# Patient Record
Sex: Female | Born: 1970 | Race: White | Hispanic: No | Marital: Single | Smoking: Current every day smoker
Health system: Southern US, Community
[De-identification: ages and names within clinical notes are randomized; demographics above are authoritative.]

## PROBLEM LIST (undated history)

## (undated) DIAGNOSIS — Z720 Tobacco use: Secondary | ICD-10-CM

## (undated) DIAGNOSIS — Z973 Presence of spectacles and contact lenses: Secondary | ICD-10-CM

## (undated) DIAGNOSIS — L732 Hidradenitis suppurativa: Secondary | ICD-10-CM

## (undated) DIAGNOSIS — Z8742 Personal history of other diseases of the female genital tract: Secondary | ICD-10-CM

## (undated) DIAGNOSIS — D071 Carcinoma in situ of vulva: Secondary | ICD-10-CM

## (undated) HISTORY — DX: Tobacco use: Z72.0

## (undated) HISTORY — PX: NO PAST SURGERIES: SHX2092

---

## 2001-11-12 ENCOUNTER — Other Ambulatory Visit: Admission: RE | Admit: 2001-11-12 | Discharge: 2001-11-12 | Payer: Self-pay

## 2002-06-24 ENCOUNTER — Other Ambulatory Visit: Admission: RE | Admit: 2002-06-24 | Discharge: 2002-06-24 | Payer: Self-pay

## 2004-06-22 ENCOUNTER — Ambulatory Visit (HOSPITAL_COMMUNITY): Admission: RE | Admit: 2004-06-22 | Discharge: 2004-06-22 | Payer: Self-pay | Admitting: Family Medicine

## 2005-08-16 ENCOUNTER — Ambulatory Visit: Payer: Self-pay | Admitting: Internal Medicine

## 2005-08-17 ENCOUNTER — Ambulatory Visit (HOSPITAL_COMMUNITY): Admission: RE | Admit: 2005-08-17 | Discharge: 2005-08-17 | Payer: Self-pay | Admitting: Internal Medicine

## 2005-08-22 ENCOUNTER — Ambulatory Visit: Payer: Self-pay | Admitting: Internal Medicine

## 2010-08-31 ENCOUNTER — Ambulatory Visit (HOSPITAL_COMMUNITY)
Admission: RE | Admit: 2010-08-31 | Discharge: 2010-08-31 | Payer: Self-pay | Source: Home / Self Care | Attending: Family Medicine | Admitting: Family Medicine

## 2011-06-05 ENCOUNTER — Other Ambulatory Visit (HOSPITAL_COMMUNITY): Payer: Self-pay | Admitting: Family Medicine

## 2011-06-05 DIAGNOSIS — N938 Other specified abnormal uterine and vaginal bleeding: Secondary | ICD-10-CM

## 2011-06-06 ENCOUNTER — Inpatient Hospital Stay (HOSPITAL_COMMUNITY): Admission: RE | Admit: 2011-06-06 | Payer: Self-pay | Source: Ambulatory Visit

## 2011-06-08 ENCOUNTER — Ambulatory Visit (HOSPITAL_COMMUNITY)
Admission: RE | Admit: 2011-06-08 | Discharge: 2011-06-08 | Disposition: A | Discharge status: Home / Self Care | Payer: Self-pay | Source: Ambulatory Visit | Attending: Family Medicine | Admitting: Family Medicine

## 2011-06-08 DIAGNOSIS — N938 Other specified abnormal uterine and vaginal bleeding: Secondary | ICD-10-CM | POA: Insufficient documentation

## 2011-06-08 DIAGNOSIS — N949 Unspecified condition associated with female genital organs and menstrual cycle: Secondary | ICD-10-CM | POA: Insufficient documentation

## 2011-10-20 ENCOUNTER — Other Ambulatory Visit (HOSPITAL_COMMUNITY): Payer: Self-pay

## 2011-10-20 DIAGNOSIS — Z1231 Encounter for screening mammogram for malignant neoplasm of breast: Secondary | ICD-10-CM

## 2011-10-25 ENCOUNTER — Other Ambulatory Visit (HOSPITAL_COMMUNITY): Payer: Self-pay | Admitting: Family Medicine

## 2011-10-25 DIAGNOSIS — Z1231 Encounter for screening mammogram for malignant neoplasm of breast: Secondary | ICD-10-CM

## 2011-10-26 ENCOUNTER — Ambulatory Visit (HOSPITAL_COMMUNITY)
Admission: RE | Admit: 2011-10-26 | Discharge: 2011-10-26 | Disposition: A | Discharge status: Home / Self Care | Payer: Self-pay | Source: Ambulatory Visit

## 2011-10-26 DIAGNOSIS — Z1231 Encounter for screening mammogram for malignant neoplasm of breast: Secondary | ICD-10-CM

## 2012-05-09 ENCOUNTER — Other Ambulatory Visit (HOSPITAL_COMMUNITY): Payer: Self-pay | Admitting: Nurse Practitioner

## 2012-05-15 ENCOUNTER — Other Ambulatory Visit (HOSPITAL_COMMUNITY): Payer: Self-pay | Admitting: Nurse Practitioner

## 2012-05-15 ENCOUNTER — Ambulatory Visit (HOSPITAL_COMMUNITY)
Admission: RE | Admit: 2012-05-15 | Discharge: 2012-05-15 | Disposition: A | Discharge status: Home / Self Care | Payer: PRIVATE HEALTH INSURANCE | Source: Ambulatory Visit | Attending: Nurse Practitioner | Admitting: Nurse Practitioner

## 2012-05-15 DIAGNOSIS — N6489 Other specified disorders of breast: Secondary | ICD-10-CM | POA: Insufficient documentation

## 2012-11-08 ENCOUNTER — Other Ambulatory Visit (HOSPITAL_COMMUNITY): Payer: Self-pay | Admitting: Nurse Practitioner

## 2012-11-12 ENCOUNTER — Ambulatory Visit (HOSPITAL_COMMUNITY): Payer: Self-pay

## 2012-11-14 ENCOUNTER — Ambulatory Visit (HOSPITAL_COMMUNITY)
Admission: RE | Admit: 2012-11-14 | Discharge: 2012-11-14 | Disposition: A | Discharge status: Home / Self Care | Payer: Self-pay | Source: Ambulatory Visit | Attending: Nurse Practitioner | Admitting: Nurse Practitioner

## 2012-11-14 DIAGNOSIS — Z139 Encounter for screening, unspecified: Secondary | ICD-10-CM

## 2014-02-26 ENCOUNTER — Other Ambulatory Visit (HOSPITAL_COMMUNITY): Payer: Self-pay | Admitting: Family Medicine

## 2014-02-26 DIAGNOSIS — N6320 Unspecified lump in the left breast, unspecified quadrant: Secondary | ICD-10-CM

## 2014-03-04 ENCOUNTER — Other Ambulatory Visit (HOSPITAL_COMMUNITY): Payer: Self-pay | Admitting: Family Medicine

## 2014-03-04 ENCOUNTER — Ambulatory Visit (HOSPITAL_COMMUNITY)
Admission: RE | Admit: 2014-03-04 | Discharge: 2014-03-04 | Disposition: A | Discharge status: Home / Self Care | Payer: Self-pay | Source: Ambulatory Visit | Attending: Family Medicine | Admitting: Family Medicine

## 2014-03-04 DIAGNOSIS — N6320 Unspecified lump in the left breast, unspecified quadrant: Secondary | ICD-10-CM

## 2014-03-04 DIAGNOSIS — N6019 Diffuse cystic mastopathy of unspecified breast: Secondary | ICD-10-CM | POA: Insufficient documentation

## 2015-07-01 ENCOUNTER — Other Ambulatory Visit (HOSPITAL_COMMUNITY): Payer: Self-pay | Admitting: Nurse Practitioner

## 2015-07-01 DIAGNOSIS — Z1231 Encounter for screening mammogram for malignant neoplasm of breast: Secondary | ICD-10-CM

## 2015-07-07 ENCOUNTER — Ambulatory Visit (HOSPITAL_COMMUNITY): Payer: Self-pay

## 2015-07-15 ENCOUNTER — Ambulatory Visit (HOSPITAL_COMMUNITY): Payer: Self-pay

## 2015-07-22 ENCOUNTER — Ambulatory Visit (HOSPITAL_COMMUNITY)
Admission: RE | Admit: 2015-07-22 | Discharge: 2015-07-22 | Disposition: A | Discharge status: Home / Self Care | Payer: Self-pay | Source: Ambulatory Visit | Attending: Nurse Practitioner | Admitting: Nurse Practitioner

## 2015-07-22 DIAGNOSIS — Z1231 Encounter for screening mammogram for malignant neoplasm of breast: Secondary | ICD-10-CM

## 2017-02-15 ENCOUNTER — Other Ambulatory Visit (HOSPITAL_COMMUNITY): Payer: Self-pay | Admitting: *Deleted

## 2017-02-15 DIAGNOSIS — Z1231 Encounter for screening mammogram for malignant neoplasm of breast: Secondary | ICD-10-CM

## 2017-03-01 ENCOUNTER — Encounter (HOSPITAL_COMMUNITY): Payer: Self-pay | Admitting: Radiology

## 2017-03-01 ENCOUNTER — Ambulatory Visit (HOSPITAL_COMMUNITY)
Admission: RE | Admit: 2017-03-01 | Discharge: 2017-03-01 | Disposition: A | Discharge status: Home / Self Care | Payer: PRIVATE HEALTH INSURANCE | Source: Ambulatory Visit | Attending: *Deleted | Admitting: *Deleted

## 2017-03-01 DIAGNOSIS — Z1231 Encounter for screening mammogram for malignant neoplasm of breast: Secondary | ICD-10-CM | POA: Diagnosis present

## 2017-03-02 ENCOUNTER — Ambulatory Visit (HOSPITAL_COMMUNITY): Payer: Self-pay

## 2018-03-28 ENCOUNTER — Other Ambulatory Visit (HOSPITAL_COMMUNITY): Payer: Self-pay | Admitting: *Deleted

## 2018-03-28 DIAGNOSIS — Z1231 Encounter for screening mammogram for malignant neoplasm of breast: Secondary | ICD-10-CM

## 2018-04-18 ENCOUNTER — Encounter (HOSPITAL_COMMUNITY): Payer: Self-pay

## 2018-04-18 ENCOUNTER — Ambulatory Visit (HOSPITAL_COMMUNITY): Payer: PRIVATE HEALTH INSURANCE

## 2018-05-02 ENCOUNTER — Other Ambulatory Visit (HOSPITAL_COMMUNITY): Payer: Self-pay | Admitting: *Deleted

## 2018-05-02 ENCOUNTER — Ambulatory Visit (HOSPITAL_COMMUNITY)
Admission: RE | Admit: 2018-05-02 | Discharge: 2018-05-02 | Disposition: A | Discharge status: Home / Self Care | Payer: PRIVATE HEALTH INSURANCE | Source: Ambulatory Visit | Attending: *Deleted | Admitting: *Deleted

## 2018-05-02 DIAGNOSIS — Z1231 Encounter for screening mammogram for malignant neoplasm of breast: Secondary | ICD-10-CM | POA: Diagnosis not present

## 2018-05-02 DIAGNOSIS — R928 Other abnormal and inconclusive findings on diagnostic imaging of breast: Secondary | ICD-10-CM

## 2018-05-03 ENCOUNTER — Other Ambulatory Visit (HOSPITAL_COMMUNITY): Payer: Self-pay | Admitting: *Deleted

## 2018-05-03 DIAGNOSIS — R928 Other abnormal and inconclusive findings on diagnostic imaging of breast: Secondary | ICD-10-CM

## 2018-05-03 DIAGNOSIS — N6489 Other specified disorders of breast: Secondary | ICD-10-CM

## 2018-05-06 ENCOUNTER — Other Ambulatory Visit (HOSPITAL_COMMUNITY): Payer: Self-pay | Admitting: *Deleted

## 2018-05-07 ENCOUNTER — Encounter (HOSPITAL_COMMUNITY): Payer: Self-pay

## 2018-05-07 ENCOUNTER — Ambulatory Visit (HOSPITAL_COMMUNITY)
Admission: RE | Admit: 2018-05-07 | Discharge: 2018-05-07 | Disposition: A | Discharge status: Home / Self Care | Payer: PRIVATE HEALTH INSURANCE | Source: Ambulatory Visit | Attending: *Deleted | Admitting: *Deleted

## 2018-05-07 DIAGNOSIS — R928 Other abnormal and inconclusive findings on diagnostic imaging of breast: Secondary | ICD-10-CM | POA: Insufficient documentation

## 2018-05-07 DIAGNOSIS — N6489 Other specified disorders of breast: Secondary | ICD-10-CM | POA: Insufficient documentation

## 2019-04-08 ENCOUNTER — Other Ambulatory Visit (HOSPITAL_COMMUNITY): Payer: Self-pay | Admitting: Nurse Practitioner

## 2019-04-08 DIAGNOSIS — Z1231 Encounter for screening mammogram for malignant neoplasm of breast: Secondary | ICD-10-CM

## 2019-05-05 ENCOUNTER — Ambulatory Visit (HOSPITAL_COMMUNITY): Payer: Self-pay

## 2019-05-05 ENCOUNTER — Encounter (HOSPITAL_COMMUNITY): Payer: Self-pay

## 2019-05-14 ENCOUNTER — Ambulatory Visit (HOSPITAL_COMMUNITY)
Admission: RE | Admit: 2019-05-14 | Discharge: 2019-05-14 | Disposition: A | Discharge status: Home / Self Care | Payer: Self-pay | Source: Ambulatory Visit | Attending: Nurse Practitioner | Admitting: Nurse Practitioner

## 2019-05-14 ENCOUNTER — Other Ambulatory Visit: Payer: Self-pay

## 2019-05-14 DIAGNOSIS — Z1231 Encounter for screening mammogram for malignant neoplasm of breast: Secondary | ICD-10-CM | POA: Insufficient documentation

## 2020-07-13 ENCOUNTER — Encounter (HOSPITAL_COMMUNITY): Payer: Self-pay

## 2020-07-13 ENCOUNTER — Other Ambulatory Visit: Payer: Self-pay

## 2020-07-13 ENCOUNTER — Emergency Department (HOSPITAL_COMMUNITY)
Admission: EM | Admit: 2020-07-13 | Discharge: 2020-07-13 | Disposition: A | Discharge status: Home / Self Care | Payer: Self-pay | Attending: Emergency Medicine | Admitting: Emergency Medicine

## 2020-07-13 DIAGNOSIS — M545 Low back pain, unspecified: Secondary | ICD-10-CM | POA: Insufficient documentation

## 2020-07-13 DIAGNOSIS — Z5321 Procedure and treatment not carried out due to patient leaving prior to being seen by health care provider: Secondary | ICD-10-CM | POA: Insufficient documentation

## 2020-07-13 HISTORY — DX: Hidradenitis suppurativa: L73.2

## 2020-07-13 NOTE — ED Triage Notes (Signed)
Pt presents to ED with complaints of mid to lower back pain since yesterday. Pt states she was tossing the ball with her dog and turned and remembers feeling "something" but then it continued to get worse as the day went on.

## 2020-11-22 ENCOUNTER — Other Ambulatory Visit: Payer: Self-pay | Admitting: Obstetrics and Gynecology

## 2020-11-22 DIAGNOSIS — Z1231 Encounter for screening mammogram for malignant neoplasm of breast: Secondary | ICD-10-CM

## 2020-12-23 ENCOUNTER — Ambulatory Visit: Payer: Self-pay

## 2021-01-20 ENCOUNTER — Ambulatory Visit: Payer: Self-pay

## 2021-01-25 ENCOUNTER — Ambulatory Visit: Payer: Self-pay

## 2021-02-24 ENCOUNTER — Ambulatory Visit
Admission: RE | Admit: 2021-02-24 | Discharge: 2021-02-24 | Disposition: A | Discharge status: Home / Self Care | Payer: No Typology Code available for payment source | Source: Ambulatory Visit | Attending: Obstetrics and Gynecology | Admitting: Obstetrics and Gynecology

## 2021-02-24 ENCOUNTER — Other Ambulatory Visit: Payer: Self-pay

## 2021-02-24 ENCOUNTER — Ambulatory Visit: Payer: Self-pay | Admitting: *Deleted

## 2021-02-24 VITALS — BP 104/74 | Wt 153.1 lb

## 2021-02-24 DIAGNOSIS — Z1231 Encounter for screening mammogram for malignant neoplasm of breast: Secondary | ICD-10-CM

## 2021-02-24 DIAGNOSIS — Z01419 Encounter for gynecological examination (general) (routine) without abnormal findings: Secondary | ICD-10-CM

## 2021-02-24 NOTE — Patient Instructions (Signed)
Explained breast self awareness with Almira Coaster. Pap smear completed today. Let her know BCCCP will cover Pap smears and HPV typing every 5 years unless has a history of abnormal Pap smears. Referred patient to the [facility] for a screening mammogram on the mobile unit. Appointment scheduled Thursday, February 24, 2021 at 1100. Patient escorted to the mobile unit following BCCCP appointment for her screening mammogram. Let patient know will follow up with her within the next couple weeks with results of her Pap smear by phone or letter. Informed patient that the Breast Center will follow-up with her within the next couple of weeks with results of her mammogram by letter or phone. Geroge Baseman [place] verbalized understanding.  Estella Malatesta, Kathaleen Maser, RN 10:07 AM

## 2021-02-24 NOTE — Progress Notes (Signed)
Ana Curry is a 50 y.o. G0P0000 female who presents to [facility] clinic today with complaint of left upper outer breast lump x 20 years that was worked up 03/04/2014 that was benign with a screening mammogram recommended in one year. Patient stated she has not noticed any changes.    Pap Smear: Pap smear completed today. Last Pap smear was over 3 years ago at [facility] clinic and was normal per patient. Per patient has history of an abnormal Pap smear around 20 years ago that a colposcopy was completed for follow-up. Patient stated all Pap smears have been normal since colposcopy and that she has had at least three normal Pap smears. Last Pap smear result is not available in Epic.   Physical exam: Breasts Right breast slightly larger than left breast that per patient is normal for her. No skin abnormalities bilateral breasts. No nipple retraction bilateral breasts. No nipple discharge bilateral breasts. No lymphadenopathy. No lumps palpated right breast. Palpated a mobile lump within the left upper outer quadrant of breast at 2 o'clock 5 cm from the nipple consistent with previous findings. No complaints of pain or tenderness on exam.  MS DIGITAL SCREENING TOMO BILATERAL  Result Date: 05/15/2019 CLINICAL DATA:  Screening. EXAM: DIGITAL SCREENING BILATERAL MAMMOGRAM WITH TOMO AND CAD COMPARISON:  Previous exam(s). ACR Breast Density Category d: The breast tissue is extremely dense, which lowers the sensitivity of mammography FINDINGS: There are no findings suspicious for malignancy. Images were processed with CAD. IMPRESSION: No mammographic evidence of malignancy. A result letter of this screening mammogram will be mailed directly to the patient. RECOMMENDATION: Screening mammogram in one year. (Code:SM-B-01Y) BI-RADS CATEGORY  1: Negative. Electronically Signed   By: Edwin Cap M.D.   On: 05/15/2019 08:07   MS DIGITAL SCREENING TOMO BILATERAL  Result Date:  05/02/2018 CLINICAL DATA:  Screening. EXAM: DIGITAL SCREENING BILATERAL MAMMOGRAM WITH TOMO AND CAD COMPARISON:  Previous exam(s). ACR Breast Density Category c: The breast tissue is heterogeneously dense, which may obscure small masses. FINDINGS: In the left breast, a possible asymmetry warrants further evaluation. In the right breast, no findings suspicious for malignancy. Images were processed with CAD. IMPRESSION: Further evaluation is suggested for possible asymmetry in the left breast. RECOMMENDATION: Diagnostic mammogram and possibly ultrasound of the left breast. (Code:FI-L-65M) The patient will be contacted regarding the findings, and additional imaging will be scheduled. BI-RADS CATEGORY  0: Incomplete. Need additional imaging evaluation and/or prior mammograms for comparison. Electronically Signed   By: Gerome Sam III M.D   On: 05/02/2018 14:30   MS DIGITAL SCREENING TOMO BILATERAL  Result Date: 03/01/2017 CLINICAL DATA:  Screening. EXAM: 2D DIGITAL SCREENING BILATERAL MAMMOGRAM WITH CAD AND ADJUNCT TOMO COMPARISON:  Previous exam(s). ACR Breast Density Category c: The breast tissue is heterogeneously dense, which may obscure small masses. FINDINGS: There are no findings suspicious for malignancy. Images were processed with CAD. IMPRESSION: No mammographic evidence of malignancy. A result letter of this screening mammogram will be mailed directly to the patient. RECOMMENDATION: Screening mammogram in one year. (Code:SM-B-01Y) BI-RADS CATEGORY  1: Negative. Electronically Signed   By: Amie Portland M.D.   On: 03/01/2017 09:00   MS DIGITAL DIAG TOMO UNI LEFT  Result Date: 05/07/2018 CLINICAL DATA:  Patient returns today to evaluate a possible LEFT breast asymmetry questioned on recent screening mammogram. EXAM: DIGITAL DIAGNOSTIC UNILATERAL LEFT MAMMOGRAM WITH CAD AND TOMO COMPARISON:  Previous exams including recent screening mammogram dated 05/02/2018. ACR Breast Density Category  c: The  breast tissue is heterogeneously dense, which may obscure small masses. FINDINGS: On today's additional diagnostic views with 3D tomosynthesis, there is no persistent abnormality within the LEFT breast indicating superimposition of normal dense fibroglandular tissues. There are no new dominant masses, suspicious calcifications or secondary signs of malignancy identified on today's exam. Mammographic images were processed with CAD. IMPRESSION: No evidence of malignancy within the LEFT breast. Patient may return to routine annual bilateral screening mammogram schedule. RECOMMENDATION: Screening mammogram in one year.(Code:SM-B-01Y) I have discussed the findings and recommendations with the patient. Results were also provided in writing at the conclusion of the visit. If applicable, a reminder letter will be sent to the patient regarding the next appointment. BI-RADS CATEGORY  1: Negative. Electronically Signed   By: Bary Richard M.D.   On: 05/07/2018 10:40       Pelvic/Bimanual Ext Genitalia No lesions, no swelling and no discharge observed on external genitalia.        Vagina Vagina pink and normal texture. No lesions or discharge observed in vagina.        Cervix Cervix is present. Cervix pink and of normal texture. No discharge observed.    Uterus Uterus is present and palpable. Uterus in normal position and normal size.        Adnexae Bilateral ovaries present and palpable. No tenderness on palpation.         Rectovaginal No rectal exam completed today since patient had no rectal complaints. No skin abnormalities observed on exam.     Smoking History: Patient is is a current smoker at over one packs per day. Discussed smoking cessation with patient. Referred to the [place] Quitline and gave resources to the free smoking cessation    Patient Navigation: Patient education provided. Access to services provided for patient through BCCCP program.   Colorectal Cancer Screening: Per patient has  had colonoscopy completed on 20 years ago.  No complaints today.    Breast and Cervical Cancer Risk Assessment: Patient does not have family history of breast cancer, known genetic mutations, or radiation treatment to the chest before age 13. Patient does not have history of cervical dysplasia, immunocompromised, or DES exposure in-utero.  Risk Assessment     Risk Scores       02/24/2021   Last edited by: Meryl Dare, CMA   5-year risk: 1.1 %   Lifetime risk: 9.9 %            A: BCCCP exam with pap smear No complaints today.  P: Referred patient to the [facility] for a screening mammogram on the mobile unit. Appointment scheduled Thursday, February 24, 2021 at 1100.  Priscille Heidelberg, RN 02/24/2021 10:07 AM

## 2021-03-03 ENCOUNTER — Telehealth: Payer: Self-pay

## 2021-03-03 ENCOUNTER — Encounter: Payer: Self-pay | Admitting: Obstetrics & Gynecology

## 2021-03-03 DIAGNOSIS — R87810 Cervical high risk human papillomavirus (HPV) DNA test positive: Secondary | ICD-10-CM | POA: Insufficient documentation

## 2021-03-03 LAB — CYTOLOGY - PAP
Comment: NEGATIVE
Comment: NEGATIVE
Diagnosis: NEGATIVE
Diagnosis: REACTIVE
HPV 16: NEGATIVE
HPV 18 / 45: NEGATIVE
High risk HPV: POSITIVE — AB

## 2021-03-03 NOTE — Telephone Encounter (Signed)
Called patient to give pap smear results. 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 Explained to patient that based on ASCCP guidelines her next pap will be due in 1 year and that no other follow-up is needed at this time. Patient voiced understanding.

## 2022-01-17 ENCOUNTER — Other Ambulatory Visit (HOSPITAL_COMMUNITY): Payer: Self-pay | Admitting: Obstetrics and Gynecology

## 2022-01-17 DIAGNOSIS — Z1231 Encounter for screening mammogram for malignant neoplasm of breast: Secondary | ICD-10-CM

## 2022-03-17 ENCOUNTER — Encounter (HOSPITAL_COMMUNITY): Payer: Self-pay

## 2022-03-17 ENCOUNTER — Other Ambulatory Visit: Payer: Self-pay

## 2022-03-17 ENCOUNTER — Inpatient Hospital Stay (HOSPITAL_COMMUNITY): Payer: No Typology Code available for payment source | Attending: Obstetrics and Gynecology | Admitting: *Deleted

## 2022-03-17 ENCOUNTER — Ambulatory Visit (HOSPITAL_COMMUNITY)
Admission: RE | Admit: 2022-03-17 | Discharge: 2022-03-17 | Disposition: A | Discharge status: Home / Self Care | Payer: Self-pay | Source: Ambulatory Visit | Attending: Obstetrics and Gynecology | Admitting: Obstetrics and Gynecology

## 2022-03-17 VITALS — BP 106/71 | Wt 153.7 lb

## 2022-03-17 DIAGNOSIS — Z01419 Encounter for gynecological examination (general) (routine) without abnormal findings: Secondary | ICD-10-CM

## 2022-03-17 DIAGNOSIS — Z1231 Encounter for screening mammogram for malignant neoplasm of breast: Secondary | ICD-10-CM | POA: Insufficient documentation

## 2022-03-17 DIAGNOSIS — Z1211 Encounter for screening for malignant neoplasm of colon: Secondary | ICD-10-CM

## 2022-03-17 NOTE — Patient Instructions (Addendum)
Explained breast self awareness with Almira Coaster. Pap smear completed today. Let patient know that next Pap smear will be based on the result of today's Pap smear. Referred patient to [facility] for a screening mammogram per recommendation. Appointment scheduled Friday, March 17, 2022 at 1130. Patient aware of appointment and will be there. Let patient know will follow up with her within the next couple weeks with results of Pap smear by phone. Informed patient that [facility] will follow up with her within the next couple of weeks with results of her mammogram by letter or phone. Discussed smoking cessation with patient. Referred to the [facility] Quitline. Geroge Baseman [place] verbalized understanding.  Berlyn Malina, Kathaleen Maser, RN 10:45 AM

## 2022-03-17 NOTE — Progress Notes (Signed)
Ms. Ana Curry is a 51 y.o. G0P0000 female who presents to [facility] clinic today with no complaints.    Pap Smear: Pap smear completed today. Last Pap smear was 02/24/2021 at [facility] clinic and was normal with positive HPV.  Per patient has history of an abnormal Pap smear around 21 years ago that a colposcopy was completed for follow-up. Patient stated all Pap smears have been normal since colposcopy and that she has had at least three normal Pap smears. Last Pap smear result is available in Epic.   Physical exam: Breasts Right breast slightly larger than left breast that per patient is normal for her. No skin abnormalities bilateral breasts. No nipple retraction bilateral breasts. No nipple discharge bilateral breasts. No lymphadenopathy. No lumps palpated right breast. Palpated a mobile lump within the left upper outer quadrant of breast at 2 o'clock 5 cm from the nipple consistent with previous findings on exam 02/24/2021. No complaints of pain or tenderness on exam.  MS DIGITAL SCREENING TOMO BILATERAL  Result Date: 02/27/2021 CLINICAL DATA:  Screening. EXAM: DIGITAL SCREENING BILATERAL MAMMOGRAM WITH TOMOSYNTHESIS AND CAD TECHNIQUE: Bilateral screening digital craniocaudal and mediolateral oblique mammograms were obtained. Bilateral screening digital breast tomosynthesis was performed. The images were evaluated with computer-aided detection. COMPARISON:  Previous exam(s). ACR Breast Density Category c: The breast tissue is heterogeneously dense, which may obscure small masses. FINDINGS: There are no findings suspicious for malignancy. IMPRESSION: No mammographic evidence of malignancy. A result letter of this screening mammogram will be mailed directly to the patient. RECOMMENDATION: Screening mammogram in one year. (Code:SM-B-01Y) BI-RADS CATEGORY  1: Negative. Electronically Signed   By: Frederico Hamman M.D.   On: 02/27/2021 06:56   MS DIGITAL SCREENING TOMO BILATERAL  Result Date:  05/15/2019 CLINICAL DATA:  Screening. EXAM: DIGITAL SCREENING BILATERAL MAMMOGRAM WITH TOMO AND CAD COMPARISON:  Previous exam(s). ACR Breast Density Category d: The breast tissue is extremely dense, which lowers the sensitivity of mammography FINDINGS: There are no findings suspicious for malignancy. Images were processed with CAD. IMPRESSION: No mammographic evidence of malignancy. A result letter of this screening mammogram will be mailed directly to the patient. RECOMMENDATION: Screening mammogram in one year. (Code:SM-B-01Y) BI-RADS CATEGORY  1: Negative. Electronically Signed   By: Edwin Cap M.D.   On: 05/15/2019 08:07   MS DIGITAL DIAG TOMO UNI LEFT  Result Date: 05/07/2018 CLINICAL DATA:  Patient returns today to evaluate a possible LEFT breast asymmetry questioned on recent screening mammogram. EXAM: DIGITAL DIAGNOSTIC UNILATERAL LEFT MAMMOGRAM WITH CAD AND TOMO COMPARISON:  Previous exams including recent screening mammogram dated 05/02/2018. ACR Breast Density Category c: The breast tissue is heterogeneously dense, which may obscure small masses. FINDINGS: On today's additional diagnostic views with 3D tomosynthesis, there is no persistent abnormality within the LEFT breast indicating superimposition of normal dense fibroglandular tissues. There are no new dominant masses, suspicious calcifications or secondary signs of malignancy identified on today's exam. Mammographic images were processed with CAD. IMPRESSION: No evidence of malignancy within the LEFT breast. Patient may return to routine annual bilateral screening mammogram schedule. RECOMMENDATION: Screening mammogram in one year.(Code:SM-B-01Y) I have discussed the findings and recommendations with the patient. Results were also provided in writing at the conclusion of the visit. If applicable, a reminder letter will be sent to the patient regarding the next appointment. BI-RADS CATEGORY  1: Negative. Electronically Signed   By: Bary Richard M.D.   On: 05/07/2018 10:40   MS DIGITAL SCREENING TOMO BILATERAL  Result  Date: 05/02/2018 CLINICAL DATA:  Screening. EXAM: DIGITAL SCREENING BILATERAL MAMMOGRAM WITH TOMO AND CAD COMPARISON:  Previous exam(s). ACR Breast Density Category c: The breast tissue is heterogeneously dense, which may obscure small masses. FINDINGS: In the left breast, a possible asymmetry warrants further evaluation. In the right breast, no findings suspicious for malignancy. Images were processed with CAD. IMPRESSION: Further evaluation is suggested for possible asymmetry in the left breast. RECOMMENDATION: Diagnostic mammogram and possibly ultrasound of the left breast. (Code:FI-L-63M) The patient will be contacted regarding the findings, and additional imaging will be scheduled. BI-RADS CATEGORY  0: Incomplete. Need additional imaging evaluation and/or prior mammograms for comparison. Electronically Signed   By: Gerome Sam III M.D   On: 05/02/2018 14:30       Pelvic/Bimanual Ext Genitalia No lesions, no swelling and no discharge observed on external genitalia.        Vagina Vagina pink and normal texture. No lesions or discharge observed in vagina.        Cervix Cervix is present. Cervix pink and of normal texture. No discharge observed.    Uterus Uterus is present and palpable. Uterus in normal position and normal size.        Adnexae Bilateral ovaries present and palpable. No tenderness on palpation.         Rectovaginal No rectal exam completed today since patient had no rectal complaints. No skin abnormalities observed on exam.     Smoking History: Patient is is a current smoker at over one packs per day. Discussed smoking cessation with patient. Referred to the [facility] Quitline. Referred to [facility] for the Lung Cancer Screening.    Patient Navigation: Patient education provided. Access to services provided for patient through BCCCP program.   Colorectal Cancer Screening: Per  patient has had colonoscopy completed on 21 years ago. FIT Test given to patient to complete. No complaints today.    Breast and Cervical Cancer Risk Assessment: Patient has family history of breast cancer, known genetic mutations, or radiation treatment to the chest before age 29. Per patient has history of cervical dysplasia. Patient has no history of being immunocompromised or DES exposure in-utero.  Risk Assessment     Risk Scores       03/17/2022 02/24/2021   Last edited by: Narda Rutherford, LPN Meryl Dare, CMA   5-year risk: 1.1 % 1.1 %   Lifetime risk: 9.7 % 9.9 %           A: BCCCP exam with pap smear No complaints.  P: Referred patient to [facility] for a screening mammogram per recommendation. Appointment scheduled Friday, March 17, 2022 at 1130.  Priscille Heidelberg, RN 03/17/2022 10:41 AM

## 2022-03-17 NOTE — Addendum Note (Signed)
Addended by: Narda Rutherford on: 03/17/2022 11:20 AM   Modules accepted: Orders

## 2022-03-21 LAB — CYTOLOGY - PAP
Comment: NEGATIVE
Diagnosis: NEGATIVE
High risk HPV: NEGATIVE

## 2022-03-29 ENCOUNTER — Telehealth: Payer: Self-pay

## 2022-03-29 NOTE — Telephone Encounter (Signed)
Patient informed negative Pap/HPV results, next pap due in 3 years. Patient verbalized understanding.  

## 2022-06-22 ENCOUNTER — Telehealth: Payer: Self-pay | Admitting: *Deleted

## 2022-06-22 NOTE — Telephone Encounter (Signed)
Spoke with the patient regarding the referral to GYN oncology. Patient scheduled as new patient with Dr Berline Lopes 10/20 at 10:30 am. Patient given an arrival time of 10 am.  Explained to the patient the the doctor will perform a pelvic exam at this visit. Patient given the policy that no visitors under the 16 yrs are allowed in the [place]. Patient given the address/phone number for the clinic and that the center offers free valet service.

## 2022-06-28 ENCOUNTER — Encounter: Payer: Self-pay | Admitting: Gynecologic Oncology

## 2022-06-29 NOTE — H&P (View-Only) (Signed)
GYNECOLOGIC ONCOLOGY NEW PATIENT CONSULTATION   Patient Name: Ana Curry  Patient Age: 51 y.o. Date of Service: 06/30/22 Referring Provider: Dr. 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  Given the risk of progression to cancer in the setting of high-grade dysplasia, I discussed recommendation for treatment.  We reviewed treatment options including surgical treatment (excision or laser ablation) as well as topical treatment.  Given the location and size of this dysplasia, I recommend surgical treatment.  We reviewed that success is similar with surgical excision versus laser ablation.  Excision allows [place] to assure no underlying malignancy.  In the setting of laser ablation, I often perform multiple additional biopsies prior to the laser to help rule out an underlying cancer.  Given the size and location of the lesion on exam today, I think it will be amenable to excision.   There are two other small lesions on the right vulva that I suspect are either condyloma or skin tags.  We discussed removing these at the time of her procedure.   I discussed the role that HPV plays in development of the majority of cervical dysplasia and cancer as well as vaginal and vulvar dysplasia.  We discussed that even with adequate treatment (such as negative margins from an excision), there is a risk of recurrence and the patient will need close continued surveillance.  We also reviewed that tobacco use is a cofactor to HPV and the development of dysplasia and malignancy.  I have encouraged the patient to quit smoking.  She voices motivation for this today.  She was given information for tobacco cessation tools/resources.     We reviewed the plan for a wide local excision of the vulva, possible vulvar biopsies, resection/biopsy of two other vulvar lesions, possible laser ablation vulva. The risks of surgery were discussed in detail and she understands these to include infection; wound separation; injury to adjacent organs; bleeding which may require blood transfusion; anesthesia risk; thromboembolic events; possible death; unforeseen complications; possible need for re-exploration; medical complications such as heart attack, stroke, pleural effusion and pneumonia. The patient will receive DVT and antibiotic prophylaxis as indicated. She voiced a clear understanding. She had the opportunity to ask questions. Perioperative instructions were reviewed with her. Prescriptions for post-op medications were sent to her pharmacy of choice.   My office reached out to the health department to see about prior Pap test results.  She was seen there in 2020 but did not have a Pap test performed at that time.  A copy of this note was sent to the patient's referring provider.   65 minutes of total time was spent for this patient encounter, including preparation, face-to-face counseling with the patient and coordination of care, and documentation of the encounter.   Jeral Pinch, MD  Division of Gynecologic Oncology  Department of Obstetrics and Gynecology  [facility]  ___________________________________________  Chief Complaint: Chief Complaint  Patient presents with   Vulvar intraepithelial neoplasia (VIN) grade 3    History of Present Illness:  Ana Curry is a 51 y.o. y.o. female who is seen in consultation at the request of Dr. 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  The patient had a vulvar lesion removed  by her dermatologist on 03/17/22. This showed VIN3, per report margins were negative for high grade dysplasia. Lesion had been there for 40 years but had become darker  over time. Recent pap test was NIML, negative for HR HPV.  Given ulcerated poorly healing area at the base of prior biopsy site, biopsy was recommended at her most recent OB/GYN visit. Labial biopsy on 06/12/22: VIN 2/3, lateral margin involved.   She has a history of an abnormal pap requiring colposcopy, denies any procedures involving her cervix.  Pap test in 2022 was negative but she was high risk HPV other positive.  Today, the patient notes having the labial lesion for at least several years prior to its removal by her dermatologist.  She has a history of hidradenitis suppurativa and when she went to the dermatologist over the summer, she asked that they do a full body exam.  It was recommended that the vulvar lesion be removed.  She did have any symptoms related to this area including pain, pruritus, bleeding, or discharge.  She endorses normal bowel function.  Has noted some mild increase in urinary frequency with menopause, otherwise denies urinary symptoms.  She works as a Educational psychologist.  PAST MEDICAL HISTORY:  Past Medical History:  Diagnosis Date   Hidradenitis suppurativa    Tobacco use      PAST SURGICAL HISTORY:  History reviewed. No pertinent surgical history.  OB/GYN HISTORY:  OB History  Gravida Para Term Preterm AB Living  0 0 0 0 0 0  SAB IAB Ectopic Multiple Live Births  0 0 0 0 0    Patient's last menstrual period was 01/22/2017.  Age at menarche: 60  Age at menopause: 70 Hx of HRT: denies Hx of STDs: yes Last pap: 02/2021 - NIML, HR HPV+; 06/12/22 - NIML, HR HPV negative History of abnormal pap smears: yes, also had an abnormal pap over 20 years ago (paps normal since)  SCREENING STUDIES:  Last mammogram: 2023  Last colonoscopy: has never had  MEDICATIONS: Outpatient Encounter Medications as of 06/30/2022  Medication Sig   dicyclomine (BENTYL) 20 MG tablet    HYDROcodone-acetaminophen (NORCO/VICODIN) 5-325 MG tablet Take 1 tablet by mouth every 4 (four)  hours as needed for severe pain. For AFTER surgery only, do not take and drive   ibuprofen (ADVIL) 800 MG tablet Take 1 tablet (800 mg total) by mouth every 8 (eight) hours as needed for moderate pain. For AFTER surgery only   senna-docusate (SENOKOT-S) 8.6-50 MG tablet Take 2 tablets by mouth at bedtime. For AFTER surgery, do not take if having diarrhea   No facility-administered encounter medications on file as of 06/30/2022.    ALLERGIES:  Allergies  Allergen Reactions   Codeine Nausea And Vomiting     FAMILY HISTORY:  Family History  Problem Relation Age of Onset   Non-Hodgkin's lymphoma Mother 57   Colon cancer Neg Hx    Breast cancer Neg Hx    Ovarian cancer Neg Hx    Endometrial cancer Neg Hx    Pancreatic cancer Neg Hx    Prostate cancer Neg Hx      SOCIAL HISTORY:  Social Connections: Not on file    REVIEW OF SYSTEMS:  + shortness of breath, dyspareunia, hot flashes, anxiety Denies appetite changes, fevers, chills, fatigue, unexplained weight changes. Denies hearing loss, neck lumps or masses, mouth sores, ringing in ears or voice changes. Denies cough or wheezing.   Denies chest pain or palpitations. Denies leg swelling. Denies  abdominal distention, pain, blood in stools, constipation, diarrhea, nausea, vomiting, or early satiety. Denies dysuria, frequency, hematuria or incontinence. Denies pelvic pain, vaginal bleeding or vaginal discharge.   Denies joint pain, back pain or muscle pain/cramps. Denies itching, rash, or wounds. Denies dizziness, headaches, numbness or seizures. Denies swollen lymph nodes or glands, denies easy bruising or bleeding. Denies depression, confusion, or decreased concentration.  Physical Exam:  Vital Signs for this encounter:  Blood pressure 123/73, pulse 88, temperature 98.4 F (36.9 C), temperature source Oral, resp. rate 16, height 5\' 8"  (1.727 m), weight 151 lb 9.6 oz (68.8 kg), last menstrual period 01/22/2017, SpO2 97 %. Body  mass index is 23.05 kg/m. General: Alert, oriented, no acute distress.  HEENT: Normocephalic, atraumatic. Sclera anicteric.  Chest: Clear to auscultation bilaterally. No wheezes, rhonchi, or rales. Cardiovascular: Regular rate and rhythm, no murmurs, rubs, or gallops.  Abdomen: Normoactive bowel sounds. Soft, nondistended, nontender to palpation. No masses or hepatosplenomegaly appreciated. No palpable fluid wave.  Extremities: Grossly normal range of motion. Warm, well perfused. No edema bilaterally.  Skin: No rashes or lesions.  Lymphatics: No cervical, supraclavicular, or inguinal adenopathy.  GU:  External genitalia notable for a 1.5 x 2 cm hypopigmented papule approximately 5:00 on the perineum, posterior and lateral to the inferior aspect of the vagina.  No ulcerations of this area noted.  2 additional skin tags versus condyloma seen, each measuring approximately 2 mm.  1 along the right labia majora at 9:00 and 1 around the right upper labia at 11:00.  No other lesions noted.             Bladder/urethra:  No lesions or masses, well supported bladder             Vagina: Atrophic, no lesions noted.             Cervix: Normal appearing, no lesions.             Uterus: Small, mobile, no parametrial involvement or nodularity.             Adnexa: No masses appreciated.  Rectal: Deferred.  LABORATORY AND RADIOLOGIC DATA:  Outside medical records were reviewed to synthesize the above history, along with the history and physical obtained during the visit.   No results found for: "WBC", "HGB", "HCT", "PLT", "GLUCOSE", "CHOL", "TRIG", "HDL", "LDLDIRECT", "[place]", "ALT", "AST", "NA", "K", "CL", "CREATININE", "BUN", "CO2", "TSH", "PSA", "INR", "GLUF", "HGBA1C", "MICROALBUR"

## 2022-06-29 NOTE — Progress Notes (Signed)
GYNECOLOGIC ONCOLOGY NEW PATIENT CONSULTATION   Patient Name: Ana Curry  Patient Age: 51 y.o. Date of Service: 06/30/22 Referring Provider: Dr. 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  Given the risk of progression to cancer in the setting of high-grade dysplasia, I discussed recommendation for treatment.  We reviewed treatment options including surgical treatment (excision or laser ablation) as well as topical treatment.  Given the location and size of this dysplasia, I recommend surgical treatment.  We reviewed that success is similar with surgical excision versus laser ablation.  Excision allows [place] to assure no underlying malignancy.  In the setting of laser ablation, I often perform multiple additional biopsies prior to the laser to help rule out an underlying cancer.  Given the size and location of the lesion on exam today, I think it will be amenable to excision.   There are two other small lesions on the right vulva that I suspect are either condyloma or skin tags.  We discussed removing these at the time of her procedure.   I discussed the role that HPV plays in development of the majority of cervical dysplasia and cancer as well as vaginal and vulvar dysplasia.  We discussed that even with adequate treatment (such as negative margins from an excision), there is a risk of recurrence and the patient will need close continued surveillance.  We also reviewed that tobacco use is a cofactor to HPV and the development of dysplasia and malignancy.  I have encouraged the patient to quit smoking.  She voices motivation for this today.  She was given information for tobacco cessation tools/resources.     We reviewed the plan for a wide local excision of the vulva, possible vulvar biopsies, resection/biopsy of two other vulvar lesions, possible laser ablation vulva. The risks of surgery were discussed in detail and she understands these to include infection; wound separation; injury to adjacent organs; bleeding which may require blood transfusion; anesthesia risk; thromboembolic events; possible death; unforeseen complications; possible need for re-exploration; medical complications such as heart attack, stroke, pleural effusion and pneumonia. The patient will receive DVT and antibiotic prophylaxis as indicated. She voiced a clear understanding. She had the opportunity to ask questions. Perioperative instructions were reviewed with her. Prescriptions for post-op medications were sent to her pharmacy of choice.   My office reached out to the health department to see about prior Pap test results.  She was seen there in 2020 but did not have a Pap test performed at that time.  A copy of this note was sent to the patient's referring provider.   65 minutes of total time was spent for this patient encounter, including preparation, face-to-face counseling with the patient and coordination of care, and documentation of the encounter.   Jeral Pinch, MD  Division of Gynecologic Oncology  Department of Obstetrics and Gynecology  [facility]  ___________________________________________  Chief Complaint: Chief Complaint  Patient presents with   Vulvar intraepithelial neoplasia (VIN) grade 3    History of Present Illness:  Ana Curry is a 51 y.o. y.o. female who is seen in consultation at the request of Dr. 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  The patient had a vulvar lesion removed  by her dermatologist on 03/17/22. This showed VIN3, per report margins were negative for high grade dysplasia. Lesion had been there for 40 years but had become darker  over time. Recent pap test was NIML, negative for HR HPV.  Given ulcerated poorly healing area at the base of prior biopsy site, biopsy was recommended at her most recent OB/GYN visit. Labial biopsy on 06/12/22: VIN 2/3, lateral margin involved.   She has a history of an abnormal pap requiring colposcopy, denies any procedures involving her cervix.  Pap test in 2022 was negative but she was high risk HPV other positive.  Today, the patient notes having the labial lesion for at least several years prior to its removal by her dermatologist.  She has a history of hidradenitis suppurativa and when she went to the dermatologist over the summer, she asked that they do a full body exam.  It was recommended that the vulvar lesion be removed.  She did have any symptoms related to this area including pain, pruritus, bleeding, or discharge.  She endorses normal bowel function.  Has noted some mild increase in urinary frequency with menopause, otherwise denies urinary symptoms.  She works as a Educational psychologist.  PAST MEDICAL HISTORY:  Past Medical History:  Diagnosis Date   Hidradenitis suppurativa    Tobacco use      PAST SURGICAL HISTORY:  History reviewed. No pertinent surgical history.  OB/GYN HISTORY:  OB History  Gravida Para Term Preterm AB Living  0 0 0 0 0 0  SAB IAB Ectopic Multiple Live Births  0 0 0 0 0    Patient's last menstrual period was 01/22/2017.  Age at menarche: 60  Age at menopause: 70 Hx of HRT: denies Hx of STDs: yes Last pap: 02/2021 - NIML, HR HPV+; 06/12/22 - NIML, HR HPV negative History of abnormal pap smears: yes, also had an abnormal pap over 20 years ago (paps normal since)  SCREENING STUDIES:  Last mammogram: 2023  Last colonoscopy: has never had  MEDICATIONS: Outpatient Encounter Medications as of 06/30/2022  Medication Sig   dicyclomine (BENTYL) 20 MG tablet    HYDROcodone-acetaminophen (NORCO/VICODIN) 5-325 MG tablet Take 1 tablet by mouth every 4 (four)  hours as needed for severe pain. For AFTER surgery only, do not take and drive   ibuprofen (ADVIL) 800 MG tablet Take 1 tablet (800 mg total) by mouth every 8 (eight) hours as needed for moderate pain. For AFTER surgery only   senna-docusate (SENOKOT-S) 8.6-50 MG tablet Take 2 tablets by mouth at bedtime. For AFTER surgery, do not take if having diarrhea   No facility-administered encounter medications on file as of 06/30/2022.    ALLERGIES:  Allergies  Allergen Reactions   Codeine Nausea And Vomiting     FAMILY HISTORY:  Family History  Problem Relation Age of Onset   Non-Hodgkin's lymphoma Mother 57   Colon cancer Neg Hx    Breast cancer Neg Hx    Ovarian cancer Neg Hx    Endometrial cancer Neg Hx    Pancreatic cancer Neg Hx    Prostate cancer Neg Hx      SOCIAL HISTORY:  Social Connections: Not on file    REVIEW OF SYSTEMS:  + shortness of breath, dyspareunia, hot flashes, anxiety Denies appetite changes, fevers, chills, fatigue, unexplained weight changes. Denies hearing loss, neck lumps or masses, mouth sores, ringing in ears or voice changes. Denies cough or wheezing.   Denies chest pain or palpitations. Denies leg swelling. Denies  abdominal distention, pain, blood in stools, constipation, diarrhea, nausea, vomiting, or early satiety. Denies dysuria, frequency, hematuria or incontinence. Denies pelvic pain, vaginal bleeding or vaginal discharge.   Denies joint pain, back pain or muscle pain/cramps. Denies itching, rash, or wounds. Denies dizziness, headaches, numbness or seizures. Denies swollen lymph nodes or glands, denies easy bruising or bleeding. Denies depression, confusion, or decreased concentration.  Physical Exam:  Vital Signs for this encounter:  Blood pressure 123/73, pulse 88, temperature 98.4 F (36.9 C), temperature source Oral, resp. rate 16, height 5\' 8"  (1.727 m), weight 151 lb 9.6 oz (68.8 kg), last menstrual period 01/22/2017, SpO2 97 %. Body  mass index is 23.05 kg/m. General: Alert, oriented, no acute distress.  HEENT: Normocephalic, atraumatic. Sclera anicteric.  Chest: Clear to auscultation bilaterally. No wheezes, rhonchi, or rales. Cardiovascular: Regular rate and rhythm, no murmurs, rubs, or gallops.  Abdomen: Normoactive bowel sounds. Soft, nondistended, nontender to palpation. No masses or hepatosplenomegaly appreciated. No palpable fluid wave.  Extremities: Grossly normal range of motion. Warm, well perfused. No edema bilaterally.  Skin: No rashes or lesions.  Lymphatics: No cervical, supraclavicular, or inguinal adenopathy.  GU:  External genitalia notable for a 1.5 x 2 cm hypopigmented papule approximately 5:00 on the perineum, posterior and lateral to the inferior aspect of the vagina.  No ulcerations of this area noted.  2 additional skin tags versus condyloma seen, each measuring approximately 2 mm.  1 along the right labia majora at 9:00 and 1 around the right upper labia at 11:00.  No other lesions noted.             Bladder/urethra:  No lesions or masses, well supported bladder             Vagina: Atrophic, no lesions noted.             Cervix: Normal appearing, no lesions.             Uterus: Small, mobile, no parametrial involvement or nodularity.             Adnexa: No masses appreciated.  Rectal: Deferred.  LABORATORY AND RADIOLOGIC DATA:  Outside medical records were reviewed to synthesize the above history, along with the history and physical obtained during the visit.   No results found for: "WBC", "HGB", "HCT", "PLT", "GLUCOSE", "CHOL", "TRIG", "HDL", "LDLDIRECT", "[place]", "ALT", "AST", "NA", "K", "CL", "CREATININE", "BUN", "CO2", "TSH", "PSA", "INR", "GLUF", "HGBA1C", "MICROALBUR"

## 2022-06-30 ENCOUNTER — Inpatient Hospital Stay: Payer: No Typology Code available for payment source | Attending: Gynecologic Oncology | Admitting: Gynecologic Oncology

## 2022-06-30 ENCOUNTER — Inpatient Hospital Stay (HOSPITAL_BASED_OUTPATIENT_CLINIC_OR_DEPARTMENT_OTHER): Payer: No Typology Code available for payment source | Admitting: Gynecologic Oncology

## 2022-06-30 ENCOUNTER — Other Ambulatory Visit: Payer: Self-pay

## 2022-06-30 ENCOUNTER — Encounter: Payer: Self-pay | Admitting: Gynecologic Oncology

## 2022-06-30 VITALS — BP 123/73 | HR 88 | Temp 98.4°F | Resp 16 | Ht 68.0 in | Wt 151.6 lb

## 2022-06-30 DIAGNOSIS — B977 Papillomavirus as the cause of diseases classified elsewhere: Secondary | ICD-10-CM | POA: Insufficient documentation

## 2022-06-30 DIAGNOSIS — D071 Carcinoma in situ of vulva: Secondary | ICD-10-CM

## 2022-06-30 DIAGNOSIS — L732 Hidradenitis suppurativa: Secondary | ICD-10-CM | POA: Insufficient documentation

## 2022-06-30 DIAGNOSIS — F17210 Nicotine dependence, cigarettes, uncomplicated: Secondary | ICD-10-CM

## 2022-06-30 DIAGNOSIS — N941 Unspecified dyspareunia: Secondary | ICD-10-CM | POA: Insufficient documentation

## 2022-06-30 DIAGNOSIS — Z720 Tobacco use: Secondary | ICD-10-CM

## 2022-06-30 DIAGNOSIS — F419 Anxiety disorder, unspecified: Secondary | ICD-10-CM | POA: Insufficient documentation

## 2022-06-30 DIAGNOSIS — R350 Frequency of micturition: Secondary | ICD-10-CM | POA: Insufficient documentation

## 2022-06-30 DIAGNOSIS — R87810 Cervical high risk human papillomavirus (HPV) DNA test positive: Secondary | ICD-10-CM

## 2022-06-30 MED ORDER — SENNOSIDES-DOCUSATE SODIUM 8.6-50 MG PO TABS: 2.0000 | ORAL_TABLET | Freq: Every day | ORAL | 0 refills | Status: DC

## 2022-06-30 MED ORDER — HYDROCODONE-ACETAMINOPHEN 5-325 MG PO TABS: 1.0000 | ORAL_TABLET | ORAL | 0 refills | Status: DC | PRN

## 2022-06-30 MED ORDER — IBUPROFEN 800 MG PO TABS: 800.0000 mg | ORAL_TABLET | Freq: Three times a day (TID) | ORAL | 0 refills | Status: DC | PRN

## 2022-06-30 NOTE — Patient Instructions (Signed)
Preparing for your Surgery  Plan for surgery on July 12, 2022 with Dr. Jeral Pinch at [facility]. You will be scheduled for examination under anesthesia, wide local excision of the vulva, possible biopsies, possible vulvar laser.   Pre-operative Testing -You will receive a phone call from presurgical testing at [facility] to discuss surgery instructions and arrange for lab work if needed.  -Bring your insurance card, copy of an advanced directive if applicable, medication list.  -You should not be taking blood thinners or aspirin at least ten days prior to surgery unless instructed by your surgeon.  -Do not take supplements such as fish oil (omega 3), red yeast rice, turmeric before your surgery. You want to avoid medications with aspirin in them including headache powders such as BC or Goody's), Excedrin migraine.  Day Before Surgery at [facility] will be advised you can have clear liquids up until 3 hours before your surgery.    Your role in recovery Your role is to become active as soon as directed by your doctor, while still giving yourself time to heal.  Rest when you feel tired. You will be asked to do the following in order to speed your recovery:  - Cough and breathe deeply. This helps to clear and expand your lungs and can prevent pneumonia after surgery.  - [place]. Do mild physical activity. Walking or moving your legs help your circulation and body functions return to normal. Do not try to get up or walk alone the first time after surgery.   -If you develop swelling on one leg or the other, pain in the back of your leg, redness/warmth in one of your legs, please call the office or go to the Emergency Room to have a doppler to rule out a blood clot. For shortness of breath, chest pain-seek care in the Emergency Room as soon as possible. - Actively manage your pain. Managing your pain lets you move in comfort. We  will ask you to rate your pain on a scale of zero to 10. It is your responsibility to tell your doctor or nurse where and how much you hurt so your pain can be treated.  Special Considerations -Your final pathology results from surgery should be available around one week after surgery and the results will be relayed to you when available.  -FMLA forms can be faxed to [phone] and please allow 5-7 business days for completion.  Pain Management After Surgery -You will be prescribed your pain medication and bowel regimen medications before surgery so that you can have these available when you are discharged from the hospital. The pain medication is for use ONLY AFTER surgery and a new prescription will not be given.   -Make sure that you have Tylenol and Ibuprofen at home IF [place] to use on a regular basis after surgery for pain control. We recommend alternating the medications every hour to six hours since they work differently and are processed in the body differently for pain relief.  -Review the attached handout on narcotic use and their risks and side effects.   Bowel Regimen -You will be prescribed Sennakot-S to take nightly to prevent constipation especially if you are taking the narcotic pain medication intermittently.  It is important to prevent constipation and drink adequate amounts of liquids. You can stop taking this medication when you are not taking pain medication and you are back on  your normal bowel routine.  Risks of Surgery Risks of surgery are low but include bleeding, infection, damage to surrounding structures, re-operation, blood clots, and very rarely death.  AFTER SURGERY INSTRUCTIONS  Return to work:  4 weeks if applicable, may be able to go back sooner if you can return with restrictions in place  We recommend purchasing several bags of frozen green peas and dividing them into ziploc bags. You will want to keep these in the freezer  and have them ready to use as ice packs to the vulvar incision. Once the ice pack is no longer cold, you can get another from the freezer. The frozen peas mold to your body better than a regular ice pack.   You can use the topical lidocaine on the incision as well up to three times daily for discomfort.  Activity: 1. Be up and out of the bed during the day.  Take a nap if needed.  You may walk up steps but be careful and use the hand rail.  Stair climbing will tire you more than you think, you may need to stop part way and rest.   2. No lifting or straining for 4 weeks over 10 pounds. No pushing, pulling, straining for 4 weeks.  3. No driving for minimum 24 hours after surgery but this is usually longer since you need to be off pain medication and able to hit the brakes safely and promptly.  Do not drive if you are taking narcotic pain medicine and make sure that your reaction time has returned.   4. You can shower as soon as the next day after surgery. Shower daily. No tub baths or submerging your body in water until cleared by your surgeon. If you have the soap that was given to you by pre-surgical testing that was used before surgery, you do not need to use it afterwards because this can irritate your incisions.   5. No sexual activity and nothing in the vagina for 4--6 weeks.  6. You may experience vaginal spotting and discharge after surgery.  The spotting is normal but if you experience heavy bleeding, call our office.  7. Take Tylenol or ibuprofen first for pain if you are able to take these medications and only use narcotic pain medication for severe pain not relieved by the Tylenol or Ibuprofen.  Monitor your Tylenol intake to a max of 4,000 mg in a 24 hour period. You can alternate these medications after surgery.  Diet: 1. Low sodium Heart Healthy Diet is recommended but you are cleared to resume your normal (before surgery) diet after your procedure.  2. It is safe to use a  laxative, such as Miralax or Colace, if you have difficulty moving your bowels. You have been prescribed Sennakot at bedtime every evening to keep bowel movements regular and to prevent constipation.    Wound Care: 1. Keep clean and dry.  Shower daily.  Reasons to call the Doctor: Fever - Oral temperature greater than 100.4 degrees Fahrenheit Foul-smelling vaginal discharge Difficulty urinating Nausea and vomiting Increased pain at the site of the incision that is unrelieved with pain medicine. Difficulty breathing with or without chest pain New calf pain especially if only on one side Sudden, continuing increased vaginal bleeding with or without clots.   Contacts: For questions or concerns you should contact:  Dr. Eugene Garnet at [phone]  Warner Mccreedy, NP at [phone]  After Hours: call [phone] and have the GYN Oncologist paged/contacted (after 5 pm or  on the weekends).  Messages sent via mychart are for non-urgent matters and are not responded to after hours so for urgent needs, please call the after hours number.

## 2022-07-03 ENCOUNTER — Other Ambulatory Visit: Payer: Self-pay | Admitting: Gynecologic Oncology

## 2022-07-03 DIAGNOSIS — D071 Carcinoma in situ of vulva: Secondary | ICD-10-CM

## 2022-07-06 ENCOUNTER — Encounter (HOSPITAL_BASED_OUTPATIENT_CLINIC_OR_DEPARTMENT_OTHER): Payer: Self-pay | Admitting: Gynecologic Oncology

## 2022-07-06 NOTE — Progress Notes (Signed)
Spoke w/ via phone for pre-op interview--- pt Lab needs dos---- urine preg (per surgeon order)              Lab results------ no COVID test -----patient states asymptomatic no test needed Arrive at ------- 1230 on 07-12-2022 NPO after MN NO Solid Food.  Clear liquids from MN until--- 1130 Med rec completed Medications to take morning of surgery ----- bentyl Diabetic medication ----- n/a Patient instructed no nail polish to be worn day of surgery Patient instructed to bring photo id and insurance card day of surgery Patient aware to have Driver (ride ) / caregiver  for 24 hours after surgery -- father, Zenia Resides Patient Special Instructions ----- n/a Pre-Op special Istructions ----- n/a Patient verbalized understanding of instructions that were given at this phone interview. Patient denies shortness of breath, chest pain, fever, cough at this phone interview.

## 2022-07-08 NOTE — Progress Notes (Signed)
Patient here for new patient consultation with Dr. Berline Lopes and for a pre-operative appointment prior to her scheduled surgery on July 12, 2022. She is scheduled for examination under anesthesia, wide local excision of the vulva, possible biopsies, possible vulvar laser. The surgery was discussed in detail.  See after visit summary for additional details.    Discussed post-op pain management in detail including the aspects of the enhanced recovery pathway.  Advised her that a new prescription would be sent in for hydrocodone/APAP and it is only to be used for after her upcoming surgery.  We discussed the use of tylenol post-op and to monitor for a maximum of 4,000 mg in a 24 hour period.  Also prescribed sennakot to be used after surgery and to hold if having loose stools.  Discussed bowel regimen in detail.     Discussed the use of SCDs and measures to take at home to prevent DVT including frequent mobility.  Reportable signs and symptoms of DVT discussed. Post-operative instructions discussed and expectations for after surgery. Incisional care discussed as well including reportable signs and symptoms including erythema, drainage, wound separation.     5 minutes spent with the patient.  Verbalizing understanding of material discussed. No needs or concerns voiced at the end of the visit.   Advised patient to call for any needs.  Advised that her post-operative medications had been prescribed and could be picked up at any time.    This appointment is included in the global surgical bundle as pre-operative teaching and has no charge.

## 2022-07-08 NOTE — Patient Instructions (Signed)
Preparing for your Surgery  Plan for surgery on July 12, 2022 with Dr. Katherine Tucker at [facility]. You will be scheduled for examination under anesthesia, wide local excision of the vulva, possible biopsies, possible vulvar laser.   Pre-operative Testing -You will receive a phone call from presurgical testing at [facility] to discuss surgery instructions and arrange for lab work if needed.  -Bring your insurance card, copy of an advanced directive if applicable, medication list.  -You should not be taking blood thinners or aspirin at least ten days prior to surgery unless instructed by your surgeon.  -Do not take supplements such as fish oil (omega 3), red yeast rice, turmeric before your surgery. You want to avoid medications with aspirin in them including headache powders such as BC or Goody's), Excedrin migraine.  Day Before Surgery at Home -You will be advised you can have clear liquids up until 3 hours before your surgery.    Your role in recovery Your role is to become active as soon as directed by your doctor, while still giving yourself time to heal.  Rest when you feel tired. You will be asked to do the following in order to speed your recovery:  - Cough and breathe deeply. This helps to clear and expand your lungs and can prevent pneumonia after surgery.  - STAY ACTIVE WHEN YOU GET HOME. Do mild physical activity. Walking or moving your legs help your circulation and body functions return to normal. Do not try to get up or walk alone the first time after surgery.   -If you develop swelling on one leg or the other, pain in the back of your leg, redness/warmth in one of your legs, please call the office or go to the Emergency Room to have a doppler to rule out a blood clot. For shortness of breath, chest pain-seek care in the Emergency Room as soon as possible. - Actively manage your pain. Managing your pain lets you move in comfort. We  will ask you to rate your pain on a scale of zero to 10. It is your responsibility to tell your doctor or nurse where and how much you hurt so your pain can be treated.  Special Considerations -Your final pathology results from surgery should be available around one week after surgery and the results will be relayed to you when available.  -FMLA forms can be faxed to [phone] and please allow 5-7 business days for completion.  Pain Management After Surgery -You will be prescribed your pain medication and bowel regimen medications before surgery so that you can have these available when you are discharged from the hospital. The pain medication is for use ONLY AFTER surgery and a new prescription will not be given.   -Make sure that you have Tylenol and Ibuprofen at home IF YOU ARE ABLE TO TAKE THESE MEDICATIONS to use on a regular basis after surgery for pain control. We recommend alternating the medications every hour to six hours since they work differently and are processed in the body differently for pain relief.  -Review the attached handout on narcotic use and their risks and side effects.   Bowel Regimen -You will be prescribed Sennakot-S to take nightly to prevent constipation especially if you are taking the narcotic pain medication intermittently.  It is important to prevent constipation and drink adequate amounts of liquids. You can stop taking this medication when you are not taking pain medication and you are back on   your normal bowel routine.  Risks of Surgery Risks of surgery are low but include bleeding, infection, damage to surrounding structures, re-operation, blood clots, and very rarely death.  AFTER SURGERY INSTRUCTIONS  Return to work:  4 weeks if applicable, may be able to go back sooner if you can return with restrictions in place  We recommend purchasing several bags of frozen green peas and dividing them into ziploc bags. You will want to keep these in the freezer  and have them ready to use as ice packs to the vulvar incision. Once the ice pack is no longer cold, you can get another from the freezer. The frozen peas mold to your body better than a regular ice pack.   You can use the topical lidocaine on the incision as well up to three times daily for discomfort.  Activity: 1. Be up and out of the bed during the day.  Take a nap if needed.  You may walk up steps but be careful and use the hand rail.  Stair climbing will tire you more than you think, you may need to stop part way and rest.   2. No lifting or straining for 4 weeks over 10 pounds. No pushing, pulling, straining for 4 weeks.  3. No driving for minimum 24 hours after surgery but this is usually longer since you need to be off pain medication and able to hit the brakes safely and promptly.  Do not drive if you are taking narcotic pain medicine and make sure that your reaction time has returned.   4. You can shower as soon as the next day after surgery. Shower daily. No tub baths or submerging your body in water until cleared by your surgeon. If you have the soap that was given to you by pre-surgical testing that was used before surgery, you do not need to use it afterwards because this can irritate your incisions.   5. No sexual activity and nothing in the vagina for 4--6 weeks.  6. You may experience vaginal spotting and discharge after surgery.  The spotting is normal but if you experience heavy bleeding, call our office.  7. Take Tylenol or ibuprofen first for pain if you are able to take these medications and only use narcotic pain medication for severe pain not relieved by the Tylenol or Ibuprofen.  Monitor your Tylenol intake to a max of 4,000 mg in a 24 hour period. You can alternate these medications after surgery.  Diet: 1. Low sodium Heart Healthy Diet is recommended but you are cleared to resume your normal (before surgery) diet after your procedure.  2. It is safe to use a  laxative, such as Miralax or Colace, if you have difficulty moving your bowels. You have been prescribed Sennakot at bedtime every evening to keep bowel movements regular and to prevent constipation.    Wound Care: 1. Keep clean and dry.  Shower daily.  Reasons to call the Doctor: Fever - Oral temperature greater than 100.4 degrees Fahrenheit Foul-smelling vaginal discharge Difficulty urinating Nausea and vomiting Increased pain at the site of the incision that is unrelieved with pain medicine. Difficulty breathing with or without chest pain New calf pain especially if only on one side Sudden, continuing increased vaginal bleeding with or without clots.   Contacts: For questions or concerns you should contact:  Dr. Katherine Tucker at [phone]  Tasia Liz, NP at [phone]  After Hours: call [phone] and have the GYN Oncologist paged/contacted (after 5 pm or   on the weekends).  Messages sent via mychart are for non-urgent matters and are not responded to after hours so for urgent needs, please call the after hours number.      

## 2022-07-11 ENCOUNTER — Telehealth: Payer: Self-pay | Admitting: Surgery

## 2022-07-11 NOTE — Discharge Instructions (Signed)
AFTER SURGERY INSTRUCTIONS   Return to work:  4 weeks if applicable, may be able to go back sooner if you can return with restrictions in place   We recommend purchasing several bags of frozen green peas and dividing them into ziploc bags. You will want to keep these in the freezer and have them ready to use as ice packs to the vulvar incision. Once the ice pack is no longer cold, you can get another from the freezer. The frozen peas mold to your body better than a regular ice pack.    You can use the topical lidocaine on the incision as well up to three times daily for discomfort.   Activity: 1. Be up and out of the bed during the day.  Take a nap if needed.  You may walk up steps but be careful and use the hand rail.  Stair climbing will tire you more than you think, you may need to stop part way and rest.    2. No lifting or straining for 4 weeks over 10 pounds. No pushing, pulling, straining for 4 weeks.   3. No driving for minimum 24 hours after surgery but this is usually longer since you need to be off pain medication and able to hit the brakes safely and promptly.  Do not drive if you are taking narcotic pain medicine and make sure that your reaction time has returned.    4. You can shower as soon as the next day after surgery. Shower daily. No tub baths or submerging your body in water until cleared by your surgeon. If you have the soap that was given to you by pre-surgical testing that was used before surgery, you do not need to use it afterwards because this can irritate your incisions.    5. No sexual activity and nothing in the vagina for 4--6 weeks.   6. You may experience vaginal spotting and discharge after surgery.  The spotting is normal but if you experience heavy bleeding, call our office.   7. Take Tylenol or ibuprofen first for pain if you are able to take these medications and only use narcotic pain medication for severe pain not relieved by the Tylenol or Ibuprofen.   Monitor your Tylenol intake to a max of 4,000 mg in a 24 hour period. You can alternate these medications after surgery.   Diet: 1. Low sodium Heart Healthy Diet is recommended but you are cleared to resume your normal (before surgery) diet after your procedure.   2. It is safe to use a laxative, such as Miralax or Colace, if you have difficulty moving your bowels. You have been prescribed Sennakot at bedtime every evening to keep bowel movements regular and to prevent constipation.     Wound Care: 1. Keep clean and dry.  Shower daily.   Reasons to call the Doctor: Fever - Oral temperature greater than 100.4 degrees Fahrenheit Foul-smelling vaginal discharge Difficulty urinating Nausea and vomiting Increased pain at the site of the incision that is unrelieved with pain medicine. Difficulty breathing with or without chest pain New calf pain especially if only on one side Sudden, continuing increased vaginal bleeding with or without clots.   Contacts: For questions or concerns you should contact:   Dr. Jeral Pinch at [phone]   Joylene John, NP at [phone]   After Hours: call [phone] and have the GYN Oncologist paged/contacted (after 5 pm or on the weekends).   Messages sent via mychart are for non-urgent matters  and are not responded to after hours so for urgent needs, please call the after hours number.   No acetaminophen/Tylenol until after 6:15 pm today if needed.   Post Anesthesia Home Care Instructions  Activity: Get plenty of rest for the remainder of the day. A responsible individual must stay with you for 24 hours following the procedure.  For the next 24 hours, DO NOT: -Drive a car -Advertising copywriter -Drink alcoholic beverages -Take any medication unless instructed by your physician -Make any legal decisions or sign important papers.  Meals: Start with liquid foods such as gelatin or soup. Progress to regular foods as tolerated. Avoid greasy,  spicy, heavy foods. If nausea and/or vomiting occur, drink only clear liquids until the nausea and/or vomiting subsides. Call your physician if vomiting continues.  Special Instructions/Symptoms: Your throat may feel dry or sore from the anesthesia or the breathing tube placed in your throat during surgery. If this causes discomfort, gargle with warm salt water. The discomfort should disappear within 24 hours.  If you had a scopolamine patch placed behind your ear for the management of post- operative nausea and/or vomiting:  1. The medication in the patch is effective for 72 hours, after which it should be removed.  Wrap patch in a tissue and discard in the trash. Wash hands thoroughly with soap and water. 2. You may remove the patch earlier than 72 hours if you experience unpleasant side effects which may include dry mouth, dizziness or visual disturbances. 3. Avoid touching the patch. Wash your hands with soap and water after contact with the patch.

## 2022-07-11 NOTE — Telephone Encounter (Signed)
Telephone call to check on pre-operative status.  Patient compliant with pre-operative instructions.  Reinforced nothing to eat after midnight. Clear liquids until 11:15am. Patient to arrive at 12:15pm.  Patient stated she is having some anxiety prior to surgery. Instructed to call office for any concerns. Patient verbalized understanding.

## 2022-07-12 ENCOUNTER — Ambulatory Visit (HOSPITAL_BASED_OUTPATIENT_CLINIC_OR_DEPARTMENT_OTHER): Payer: No Typology Code available for payment source | Admitting: Certified Registered"

## 2022-07-12 ENCOUNTER — Ambulatory Visit (HOSPITAL_BASED_OUTPATIENT_CLINIC_OR_DEPARTMENT_OTHER)
Admission: RE | Admit: 2022-07-12 | Discharge: 2022-07-12 | Disposition: A | Discharge status: Home / Self Care | Payer: No Typology Code available for payment source | Attending: Gynecologic Oncology | Admitting: Gynecologic Oncology

## 2022-07-12 ENCOUNTER — Encounter (HOSPITAL_BASED_OUTPATIENT_CLINIC_OR_DEPARTMENT_OTHER): Payer: Self-pay | Admitting: Gynecologic Oncology

## 2022-07-12 ENCOUNTER — Other Ambulatory Visit: Payer: Self-pay

## 2022-07-12 ENCOUNTER — Encounter (HOSPITAL_BASED_OUTPATIENT_CLINIC_OR_DEPARTMENT_OTHER)
Admission: RE | Disposition: A | Discharge status: Home / Self Care | Payer: Self-pay | Source: Home / Self Care | Attending: Gynecologic Oncology

## 2022-07-12 DIAGNOSIS — D071 Carcinoma in situ of vulva: Secondary | ICD-10-CM

## 2022-07-12 DIAGNOSIS — Z01818 Encounter for other preprocedural examination: Secondary | ICD-10-CM

## 2022-07-12 DIAGNOSIS — F17200 Nicotine dependence, unspecified, uncomplicated: Secondary | ICD-10-CM | POA: Insufficient documentation

## 2022-07-12 HISTORY — DX: Carcinoma in situ of vulva: D07.1

## 2022-07-12 HISTORY — DX: Personal history of other diseases of the female genital tract: Z87.42

## 2022-07-12 HISTORY — DX: Presence of spectacles and contact lenses: Z97.3

## 2022-07-12 HISTORY — PX: VULVA /PERINEUM BIOPSY: SHX319

## 2022-07-12 HISTORY — PX: VULVECTOMY PARTIAL: SHX6187

## 2022-07-12 LAB — POCT PREGNANCY, URINE: Preg Test, Ur: NEGATIVE

## 2022-07-12 SURGERY — VULVECTOMY, PARTIAL
Anesthesia: General | Site: Vulva

## 2022-07-12 MED ORDER — PHENYLEPHRINE 80 MCG/ML (10ML) SYRINGE FOR IV PUSH (FOR BLOOD PRESSURE SUPPORT)
PREFILLED_SYRINGE | INTRAVENOUS | Status: AC
  Filled 2022-07-12: qty 10

## 2022-07-12 MED ORDER — 0.9 % SODIUM CHLORIDE (POUR BTL) OPTIME
TOPICAL | Status: DC | PRN
  Administered 2022-07-12: 500 mL

## 2022-07-12 MED ORDER — GABAPENTIN 300 MG PO CAPS
ORAL_CAPSULE | ORAL | Status: AC
  Filled 2022-07-12: qty 1

## 2022-07-12 MED ORDER — GABAPENTIN 300 MG PO CAPS
300.0000 mg | ORAL_CAPSULE | ORAL | Status: AC
  Administered 2022-07-12: 300 mg via ORAL

## 2022-07-12 MED ORDER — SILVER SULFADIAZINE 1 % EX CREA
TOPICAL_CREAM | CUTANEOUS | Status: AC
  Filled 2022-07-12: qty 50

## 2022-07-12 MED ORDER — KETOROLAC TROMETHAMINE 30 MG/ML IJ SOLN
INTRAMUSCULAR | Status: AC
  Filled 2022-07-12: qty 1

## 2022-07-12 MED ORDER — FENTANYL CITRATE (PF) 100 MCG/2ML IJ SOLN
INTRAMUSCULAR | Status: DC | PRN
  Administered 2022-07-12 (×2): 50 ug via INTRAVENOUS

## 2022-07-12 MED ORDER — KETOROLAC TROMETHAMINE 15 MG/ML IJ SOLN: 15.0000 mg | INTRAMUSCULAR | Status: DC

## 2022-07-12 MED ORDER — MIDAZOLAM HCL 2 MG/2ML IJ SOLN
INTRAMUSCULAR | Status: AC
  Filled 2022-07-12: qty 2

## 2022-07-12 MED ORDER — PROPOFOL 10 MG/ML IV BOLUS
INTRAVENOUS | Status: DC | PRN
  Administered 2022-07-12: 40 mg via INTRAVENOUS
  Administered 2022-07-12: 200 mg via INTRAVENOUS

## 2022-07-12 MED ORDER — ONDANSETRON HCL 4 MG/2ML IJ SOLN
INTRAMUSCULAR | Status: DC | PRN
  Administered 2022-07-12: 4 mg via INTRAVENOUS

## 2022-07-12 MED ORDER — PROPOFOL 10 MG/ML IV BOLUS
INTRAVENOUS | Status: AC
  Filled 2022-07-12: qty 20

## 2022-07-12 MED ORDER — LIDOCAINE HCL 1 % IJ SOLN
INTRAMUSCULAR | Status: DC | PRN
  Administered 2022-07-12 (×2): 10 mL

## 2022-07-12 MED ORDER — LIDOCAINE HCL 1 % IJ SOLN
INTRAMUSCULAR | Status: AC
  Filled 2022-07-12: qty 20

## 2022-07-12 MED ORDER — ACETIC ACID 5 % SOLN
Status: AC
  Filled 2022-07-12: qty 100

## 2022-07-12 MED ORDER — PHENYLEPHRINE 80 MCG/ML (10ML) SYRINGE FOR IV PUSH (FOR BLOOD PRESSURE SUPPORT)
PREFILLED_SYRINGE | INTRAVENOUS | Status: DC | PRN
  Administered 2022-07-12: 240 ug via INTRAVENOUS
  Administered 2022-07-12 (×2): 80 ug via INTRAVENOUS
  Administered 2022-07-12: 160 ug via INTRAVENOUS
  Administered 2022-07-12: 240 ug via INTRAVENOUS
  Administered 2022-07-12: 160 ug via INTRAVENOUS

## 2022-07-12 MED ORDER — MIDAZOLAM HCL 5 MG/5ML IJ SOLN
INTRAMUSCULAR | Status: DC | PRN
  Administered 2022-07-12: 2 mg via INTRAVENOUS

## 2022-07-12 MED ORDER — SCOPOLAMINE 1 MG/3DAYS TD PT72
1.0000 | MEDICATED_PATCH | TRANSDERMAL | Status: DC
  Administered 2022-07-12: 1.5 mg via TRANSDERMAL

## 2022-07-12 MED ORDER — FERRIC SUBSULFATE 259 MG/GM EX SOLN
CUTANEOUS | Status: AC
  Filled 2022-07-12: qty 8

## 2022-07-12 MED ORDER — FENTANYL CITRATE (PF) 100 MCG/2ML IJ SOLN
INTRAMUSCULAR | Status: AC
  Filled 2022-07-12: qty 2

## 2022-07-12 MED ORDER — DEXAMETHASONE SODIUM PHOSPHATE 10 MG/ML IJ SOLN
INTRAMUSCULAR | Status: AC
  Filled 2022-07-12: qty 1

## 2022-07-12 MED ORDER — GLYCOPYRROLATE PF 0.2 MG/ML IJ SOSY
PREFILLED_SYRINGE | INTRAMUSCULAR | Status: DC | PRN
  Administered 2022-07-12: .2 mg via INTRAVENOUS

## 2022-07-12 MED ORDER — OXYCODONE HCL 5 MG/5ML PO SOLN: 5.0000 mg | Freq: Once | ORAL | Status: DC | PRN

## 2022-07-12 MED ORDER — LIDOCAINE HCL (PF) 2 % IJ SOLN
INTRAMUSCULAR | Status: AC
  Filled 2022-07-12: qty 5

## 2022-07-12 MED ORDER — GLYCOPYRROLATE PF 0.2 MG/ML IJ SOSY
PREFILLED_SYRINGE | INTRAMUSCULAR | Status: AC
  Filled 2022-07-12: qty 1

## 2022-07-12 MED ORDER — LIDOCAINE 2% (20 MG/ML) 5 ML SYRINGE
INTRAMUSCULAR | Status: DC | PRN
  Administered 2022-07-12: 100 mg via INTRAVENOUS

## 2022-07-12 MED ORDER — PHENYLEPHRINE 80 MCG/ML (10ML) SYRINGE FOR IV PUSH (FOR BLOOD PRESSURE SUPPORT)
PREFILLED_SYRINGE | INTRAVENOUS | Status: AC
  Filled 2022-07-12: qty 40

## 2022-07-12 MED ORDER — KETOROLAC TROMETHAMINE 30 MG/ML IJ SOLN
INTRAMUSCULAR | Status: DC | PRN
  Administered 2022-07-12: 30 mg via INTRAVENOUS

## 2022-07-12 MED ORDER — LACTATED RINGERS IV SOLN: INTRAVENOUS | Status: DC

## 2022-07-12 MED ORDER — DEXAMETHASONE SODIUM PHOSPHATE 10 MG/ML IJ SOLN: 4.0000 mg | INTRAMUSCULAR | Status: DC

## 2022-07-12 MED ORDER — OXYCODONE HCL 5 MG PO TABS: 5.0000 mg | ORAL_TABLET | Freq: Once | ORAL | Status: DC | PRN

## 2022-07-12 MED ORDER — DEXAMETHASONE SODIUM PHOSPHATE 10 MG/ML IJ SOLN
INTRAMUSCULAR | Status: DC | PRN
  Administered 2022-07-12: 10 mg via INTRAVENOUS

## 2022-07-12 MED ORDER — ACETAMINOPHEN 500 MG PO TABS
1000.0000 mg | ORAL_TABLET | ORAL | Status: AC
  Administered 2022-07-12: 1000 mg via ORAL

## 2022-07-12 MED ORDER — ONDANSETRON HCL 4 MG/2ML IJ SOLN
INTRAMUSCULAR | Status: AC
  Filled 2022-07-12: qty 2

## 2022-07-12 MED ORDER — ACETAMINOPHEN 500 MG PO TABS
ORAL_TABLET | ORAL | Status: AC
  Filled 2022-07-12: qty 2

## 2022-07-12 MED ORDER — ACETAMINOPHEN 10 MG/ML IV SOLN: 1000.0000 mg | Freq: Once | INTRAVENOUS | Status: DC | PRN

## 2022-07-12 MED ORDER — ONDANSETRON HCL 4 MG/2ML IJ SOLN: 4.0000 mg | Freq: Once | INTRAMUSCULAR | Status: DC | PRN

## 2022-07-12 MED ORDER — SCOPOLAMINE 1 MG/3DAYS TD PT72
MEDICATED_PATCH | TRANSDERMAL | Status: AC
  Filled 2022-07-12: qty 1

## 2022-07-12 MED ORDER — FENTANYL CITRATE (PF) 100 MCG/2ML IJ SOLN: 25.0000 ug | INTRAMUSCULAR | Status: DC | PRN

## 2022-07-12 SURGICAL SUPPLY — 37 items
BLADE CLIPPER SENSICLIP SURGIC (BLADE) IMPLANT
BLADE SURG 15 STRL LF DISP TIS (BLADE) ×3 IMPLANT
BLADE SURG 15 STRL SS (BLADE) ×2
CATH ROBINSON RED A/P 16FR (CATHETERS) ×3 IMPLANT
DEPRESSOR TONGUE 6 IN STERILE (GAUZE/BANDAGES/DRESSINGS) ×3 IMPLANT
DRSG TELFA 3X8 NADH STRL (GAUZE/BANDAGES/DRESSINGS) IMPLANT
ELECT BALL LEEP 3MM BLK (ELECTRODE) IMPLANT
GAUZE 4X4 16PLY ~~LOC~~+RFID DBL (SPONGE) ×3 IMPLANT
GLOVE BIO SURGEON STRL SZ 6 (GLOVE) ×6 IMPLANT
GOWN STRL REUS W/TWL LRG LVL3 (GOWN DISPOSABLE) ×3 IMPLANT
KIT TURNOVER CYSTO (KITS) ×3 IMPLANT
NDL HYPO 25X1 1.5 SAFETY (NEEDLE) ×2 IMPLANT
NEEDLE HYPO 25X1 1.5 SAFETY (NEEDLE) ×2 IMPLANT
NS IRRIG 500ML POUR BTL (IV SOLUTION) ×3 IMPLANT
PACK PERINEAL COLD (PAD) ×3 IMPLANT
PACK VAGINAL WOMENS (CUSTOM PROCEDURE TRAY) ×3 IMPLANT
PAD PREP 24X48 CUFFED NSTRL (MISCELLANEOUS) ×3 IMPLANT
PUNCH BIOPSY DERMAL 3 (INSTRUMENTS) IMPLANT
PUNCH BIOPSY DERMAL 3MM (INSTRUMENTS)
PUNCH BIOPSY DERMAL 4MM (INSTRUMENTS) IMPLANT
SUT VIC AB 0 CT1 36 (SUTURE) IMPLANT
SUT VIC AB 0 SH 27 (SUTURE) ×3 IMPLANT
SUT VIC AB 2-0 CT2 27 (SUTURE) IMPLANT
SUT VIC AB 2-0 SH 27 (SUTURE) ×2
SUT VIC AB 2-0 SH 27XBRD (SUTURE) ×1 IMPLANT
SUT VIC AB 3-0 PS2 18 (SUTURE)
SUT VIC AB 3-0 PS2 18XBRD (SUTURE) IMPLANT
SUT VIC AB 3-0 SH 27 (SUTURE) ×2
SUT VIC AB 3-0 SH 27X BRD (SUTURE) ×3 IMPLANT
SUT VIC AB 4-0 PS2 18 (SUTURE) ×4 IMPLANT
SUT VICRYL 0 UR6 27IN ABS (SUTURE) IMPLANT
SWAB OB GYN 8IN STERILE 2PK (MISCELLANEOUS) ×6 IMPLANT
SYR BULB IRRIG 60ML STRL (SYRINGE) ×4 IMPLANT
TOWEL OR 17X26 10 PK STRL BLUE (TOWEL DISPOSABLE) ×3 IMPLANT
TUBE CONNECTING 12X1/4 (SUCTIONS) IMPLANT
VACUUM HOSE 7/8X10 W/ WAND (MISCELLANEOUS) IMPLANT
WATER STERILE IRR 500ML POUR (IV SOLUTION) ×3 IMPLANT

## 2022-07-12 NOTE — Op Note (Signed)
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 Two < 56mm hyperpigmented lesions on the right upper vulvar c/w skin tags versus condyloma. No other vulvar lesions noted.  Procedure: The patient was identified in the preoperative holding area. Informed consent was signed on the chart. Patient was seen history was reviewed and exam was performed.   The patient was then taken to the operating room and placed in the supine position with SCD hose on. General anesthesia was then induced without difficulty. She was then placed in the dorsolithotomy position. The perineum was prepped with Betadine. The vagina was prepped with Betadine. The patient was then draped after the prep was dried.   Timeout was performed the patient, procedure, antibiotic, allergy, and length of procedure. Vulva was inspected with findings noted above. The lesion was identified and the marking pen was used to circumscribe the area with appropriate surgical margins. The subcuticular tissues were infiltrated with 1% lidocaine. The 15 blade scalpel was used to make an incision through the skin circumferentially as marked. The skin elipse was grasped and was separated from the underlying deep dermal tissues with the bovie device. After the specimen had been completely resected, it was oriented and marked at 12 o'clock  with a 0-vicryl suture. The bovie was used to obtain hemostasis at the surgical bed. The subcutaneous tissues were irrigated and made hemostatic.   The deep dermal layer was approximated with 2-0 and 3-0 vicryl mattress sutures to bring the skin edges into approximation and off tension. The wound was closed following langher's lines. The cutaneous layer was closed with interrupted 4-0 vicryl stitches and mattress sutures to ensure a tension free and hemostatic closure.  Scalpel was used to resect two right vulvar lesions. Monopolar electrocautery was used to achieve hemostasis.  The perineum was again irrigated.  All instrument, suture, laparotomy, Ray-Tec, and needle counts were correct x2. The patient tolerated the procedure well and was taken recovery room in stable condition.   Jeral Pinch MD Gynecologic Oncology

## 2022-07-12 NOTE — Anesthesia Postprocedure Evaluation (Signed)
Anesthesia Post Note  Patient: Ana Curry  Procedure(s) Performed: WIDE EXCISION VULVA (Vulva) VULVAR BIOPSY (Vulva)     Patient location during evaluation: PACU Anesthesia Type: General Level of consciousness: awake and alert Pain management: pain level controlled Vital Signs Assessment: post-procedure vital signs reviewed and stable Respiratory status: spontaneous breathing, nonlabored ventilation and respiratory function stable Cardiovascular status: blood pressure returned to baseline and stable Postop Assessment: no apparent nausea or vomiting Anesthetic complications: no   No notable events documented.  Last Vitals:  Vitals:   07/12/22 1641 07/12/22 1711  BP: 118/83 121/86  Pulse: 88 83  Resp: 19 17  Temp:  36.6 C  SpO2: 100% 97%    Last Pain:  Vitals:   07/12/22 1711  TempSrc:   PainSc: 0-No pain                 Drakkar Medeiros,W. EDMOND

## 2022-07-12 NOTE — Anesthesia Preprocedure Evaluation (Signed)
Anesthesia Evaluation  Patient identified by MRN, date of birth, ID band Patient awake    Reviewed: Allergy & Precautions, NPO status , Patient's Chart, lab work & pertinent test results  Airway Mallampati: II  TM Distance: >3 FB Neck ROM: Full    Dental no notable dental hx.    Pulmonary Current Smoker and Patient abstained from smoking.,    Pulmonary exam normal breath sounds clear to auscultation       Cardiovascular negative cardio ROS Normal cardiovascular exam Rhythm:Regular Rate:Normal     Neuro/Psych negative neurological ROS  negative psych ROS   GI/Hepatic negative GI ROS, Neg liver ROS,   Endo/Other  negative endocrine ROS  Renal/GU negative Renal ROS  negative genitourinary   Musculoskeletal negative musculoskeletal ROS (+)   Abdominal   Peds negative pediatric ROS (+)  Hematology negative hematology ROS (+)   Anesthesia Other Findings   Reproductive/Obstetrics negative OB ROS                             Anesthesia Physical Anesthesia Plan  ASA: 2  Anesthesia Plan: General   Post-op Pain Management: Toradol IV (intra-op)*   Induction: Intravenous  PONV Risk Score and Plan: 2 and Ondansetron, Dexamethasone, Treatment may vary due to age or medical condition and Midazolam  Airway Management Planned: LMA  Additional Equipment:   Intra-op Plan:   Post-operative Plan: Extubation in OR  Informed Consent: I have reviewed the patients History and Physical, chart, labs and discussed the procedure including the risks, benefits and alternatives for the proposed anesthesia with the patient or authorized representative who has indicated his/her understanding and acceptance.     Dental advisory given  Plan Discussed with: CRNA and Surgeon  Anesthesia Plan Comments:         Anesthesia Quick Evaluation

## 2022-07-12 NOTE — Transfer of Care (Signed)
Immediate Anesthesia Transfer of Care Note  Patient: Ana Curry  Procedure(s) Performed: Procedure(s) (LRB): WIDE EXCISION VULVA (N/A) VULVAR BIOPSY (N/A)  Patient Location: PACU  Anesthesia Type: General  Level of Consciousness: awake, oriented, sedated and patient cooperative  Airway & Oxygen Therapy: Patient Spontanous Breathing and Patient connected to face mask oxygen  Post-op Assessment: Report given to PACU RN and Post -op Vital signs reviewed and stable  Post vital signs: Reviewed and stable  Complications: No apparent anesthesia complications  Last Vitals:  Vitals Value Taken Time  BP    Temp    Pulse 125 07/12/22 1608  Resp 21 07/12/22 1608  SpO2 98 % 07/12/22 1608  Vitals shown include unvalidated device data.  Last Pain:  Vitals:   07/12/22 1304  TempSrc: Oral         Complications: No notable events documented.

## 2022-07-12 NOTE — Interval H&P Note (Signed)
History and Physical Interval Note:  07/12/2022 12:23 PM  Ana Curry  has presented today for surgery, with the diagnosis of VIN 3.  The various methods of treatment have been discussed with the patient and family. After consideration of risks, benefits and other options for treatment, the patient has consented to  Procedure(s): VULVECTOMY PARTIAL (N/A) POSSIBLE VULVAR BIOPSY (N/A) POSSIBLE CO2 LASER APPLICATION (N/A) as a surgical intervention.  The patient's history has been reviewed, patient examined, no change in status, stable for surgery.  I have reviewed the patient's chart and labs.  Questions were answered to the patient's satisfaction.     [name]

## 2022-07-12 NOTE — Anesthesia Procedure Notes (Signed)
Procedure Name: LMA Insertion Date/Time: 07/12/2022 3:29 PM  Performed by: Rogers Blocker, CRNAPre-anesthesia Checklist: Patient identified, Emergency Drugs available, Suction available and Patient being monitored Patient Re-evaluated:Patient Re-evaluated prior to induction Oxygen Delivery Method: Circle System Utilized Preoxygenation: Pre-oxygenation with 100% oxygen Induction Type: IV induction Ventilation: Mask ventilation without difficulty LMA: LMA inserted LMA Size: 4.0 Number of attempts: 1 Placement Confirmation: positive ETCO2 Tube secured with: Tape Dental Injury: Teeth and Oropharynx as per pre-operative assessment

## 2022-07-13 ENCOUNTER — Encounter (HOSPITAL_BASED_OUTPATIENT_CLINIC_OR_DEPARTMENT_OTHER): Payer: Self-pay | Admitting: Gynecologic Oncology

## 2022-07-13 ENCOUNTER — Telehealth: Payer: Self-pay | Admitting: Surgery

## 2022-07-13 NOTE — Telephone Encounter (Signed)
Spoke with Ms. Belzer this morning. She states she is eating, drinking and urinating well. She has not had a BM yet but is passing gas. She is taking senokot as prescribed and encouraged her to drink plenty of water. She denies fever or chills. Incisions are dry and intact. She rates her pain 4/10. Her pain is controlled with Tylenol.    Instructed to call office with any fever, chills, purulent drainage, uncontrolled pain or any other questions or concerns. Patient verbalizes understanding.   Pt aware of post op appointments as well as the office number [phone] and after hours number [phone] to call if she has any questions or concerns

## 2022-07-19 ENCOUNTER — Telehealth: Payer: Self-pay

## 2022-07-19 NOTE — Telephone Encounter (Signed)
Pt called today stating the past few days when she has a BM she is in excruciating pain.8/10 on pain scale relieved by Ibuprofen 800mg . She states yesterday she noticed the incision site from surgery had opened up and bled a little. This morning when she went to the bathroom she noticed suture on toilet tissue and it is bleeding more. Not pouring out. No odor/discharge, no fever/chills. Doesn't seem to be swollen.  Pt will send a picture if she can figure out how.   Pt aware of Dr. being in the OR today and will respond to message at her convenience. Pt aware I will also wait on photo of site.

## 2022-07-19 NOTE — Telephone Encounter (Signed)
If there is a concern about infection, I'm happy to see her. Once the incision opens, we let it heal from the inside out

## 2022-07-19 NOTE — Telephone Encounter (Signed)
If she is bleeding more heavily, she can come in and see Dr. Tamela Oddi today. Otherwise, she can come on Friday to see me or Melissa.

## 2022-07-19 NOTE — Telephone Encounter (Signed)
Pt states she is currently not having anymore bleeding. She declined being seen this afternoon by Dr. Tamela Oddi stating it is hard for her to find a ride. As for a Friday appointment with Dr. Pricilla Holm, pt states she wants to keep an eye on it today/tonight and will call back tomorrow if she feels she needs to be seen. She does want to know does she need to have it sown back up? I told her it was hard to tell by this picture and she would need to be seen in order to make that call. Pt again stated she will watch it tonight and call tomorrow if signs of infection, odor/discharge and will check for fever/chills.  Dr. Pricilla Holm notified

## 2022-07-20 ENCOUNTER — Telehealth: Payer: Self-pay | Admitting: *Deleted

## 2022-07-20 NOTE — Telephone Encounter (Signed)
Patient called back and stated "I need to let Dr Pricilla Holm know that I did have a bowel movement this morning and the incision has tore open more. I can't sit that long to come over there for an appt or sit there for an appt. Dr Fleet Contras who referred me is a close family friend and his office is here in town. He can see me today top help me. But I don't want to go over Dr Pricilla Holm straight to him without asking her. Would she be ok with this? I am having bleeding at the incision site since the bowel movement." Explained that the message would be given to the nurse and Dr Pricilla Holm

## 2022-07-20 NOTE — Telephone Encounter (Signed)
Yes, of course she can see him if its more convenient and she prefers to go there

## 2022-07-20 NOTE — Telephone Encounter (Signed)
Great - thanks [employer]

## 2022-07-20 NOTE — Telephone Encounter (Signed)
Follow up call from yesterday, pt states she has had no more bleeding from the site. No oozing/odor. No fever/chills, no pain. She declined coming in to office.  She is fine with keeping an eye on it and will call if anything gets worse. After hour number given since coming up on weekend.  Dr. Pricilla Holm notified

## 2022-07-20 NOTE — Telephone Encounter (Signed)
Pt aware ok to see her GYN Dr.  She states she will keep [place] updated.

## 2022-07-24 LAB — SURGICAL PATHOLOGY

## 2022-07-24 NOTE — Telephone Encounter (Signed)
Could someone please call this patient today to check in? Thank you

## 2022-07-25 ENCOUNTER — Telehealth: Payer: Self-pay

## 2022-07-25 NOTE — Telephone Encounter (Signed)
I connected with Ana Curry, she states she has been seeing Dr. Ralph Dowdy x 3 days (see notes). Her next appointment in Thursday 11/16. He has been cleaning and packing it. She states it hurts and therefore she is trying to stay off her feet as much as possible. She declined coming in to office stating she feels comfortable continuing to see [place], and it is much closer to her as far as traveling.  She is aware of her post op appointment with Dr.Tucker on 12/5 and if anything gets worse she will call to be seen sooner.  Dr. Pricilla Holm notified.

## 2022-08-11 IMAGING — MG MM DIGITAL SCREENING BILAT W/ TOMO AND CAD
6 of 10 series · 6 of 30 positions shown · non-contrast
Comparison: Previous exam(s).

CLINICAL DATA: Screening.

EXAM:
DIGITAL SCREENING BILATERAL MAMMOGRAM WITH TOMOSYNTHESIS AND CAD
TECHNIQUE: Bilateral screening digital craniocaudal and mediolateral oblique
mammograms were obtained. Bilateral screening digital breast
tomosynthesis was performed. The images were evaluated with
computer-aided detection.

[R CC synth-2D (1 of 2)]
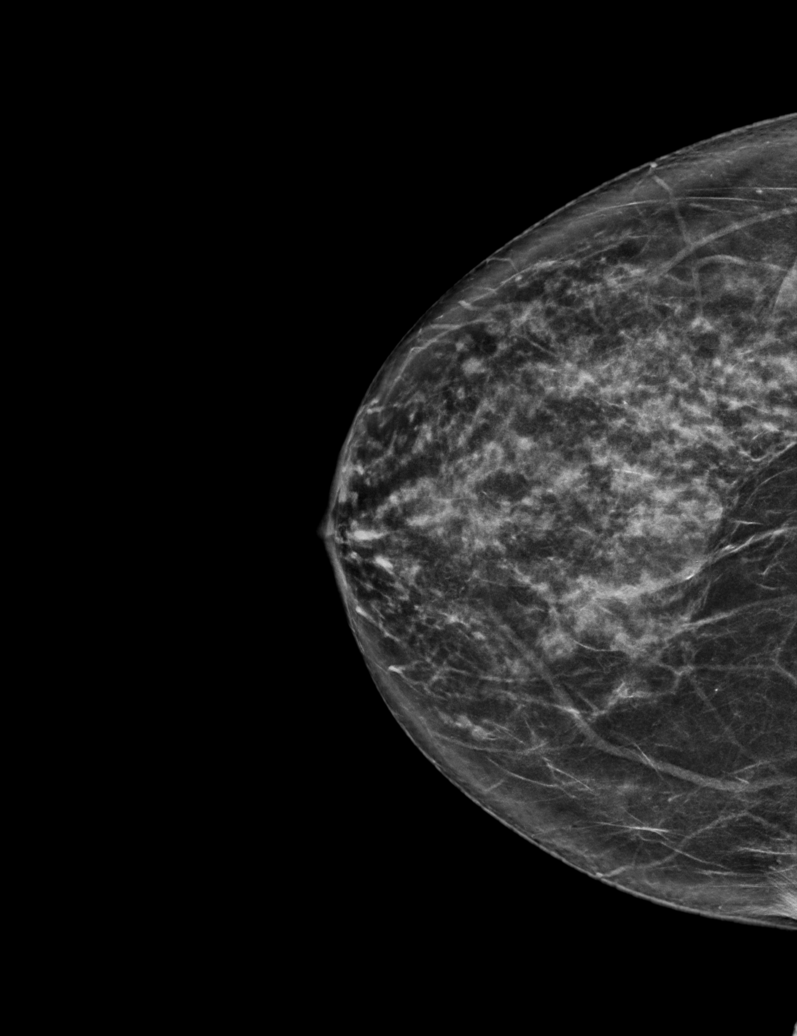

[R MLO synth-2D]
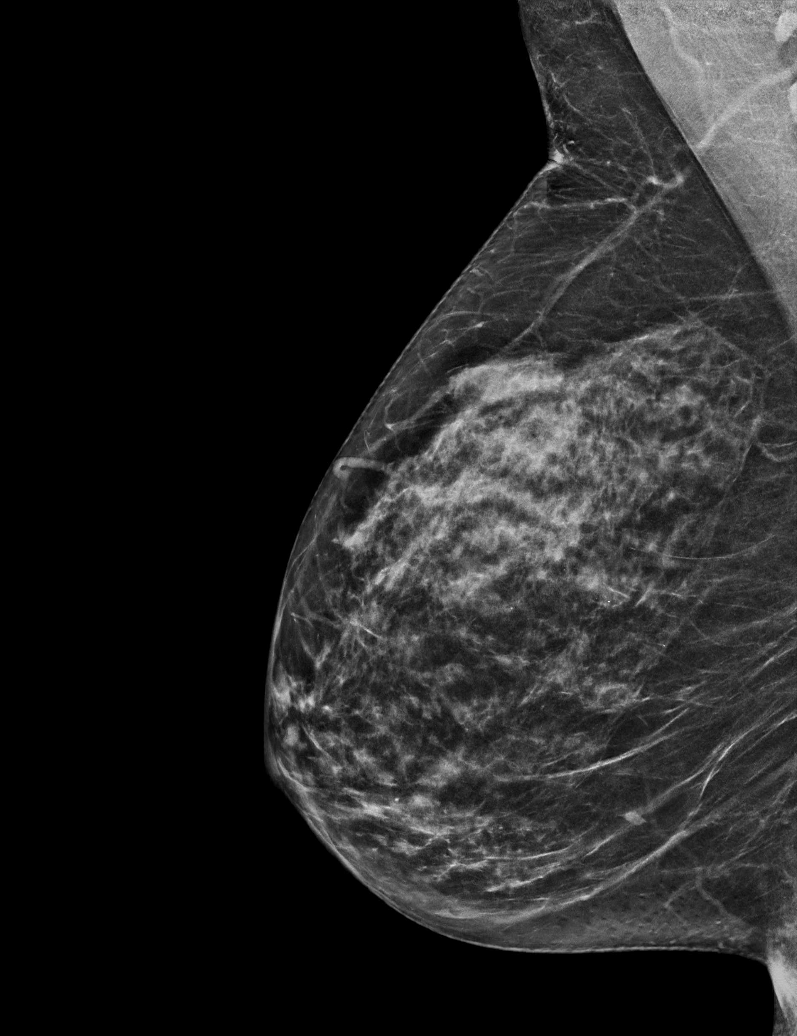

[L CC synth-2D]
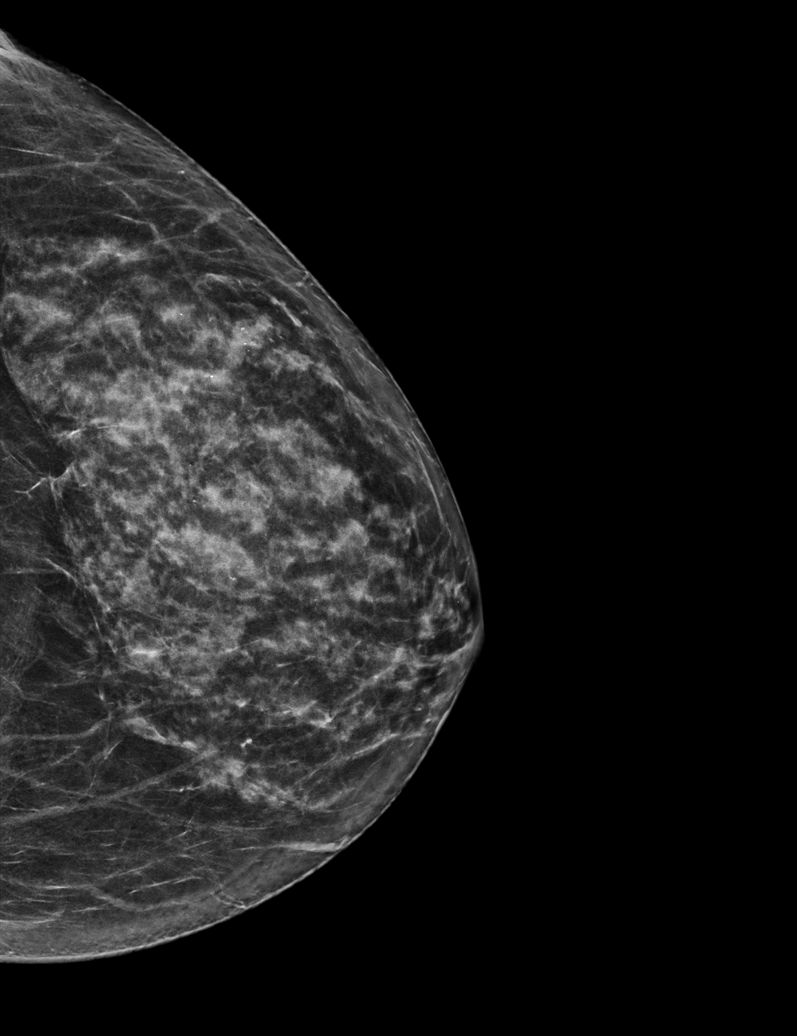

[R CC synth-2D (2 of 2)]
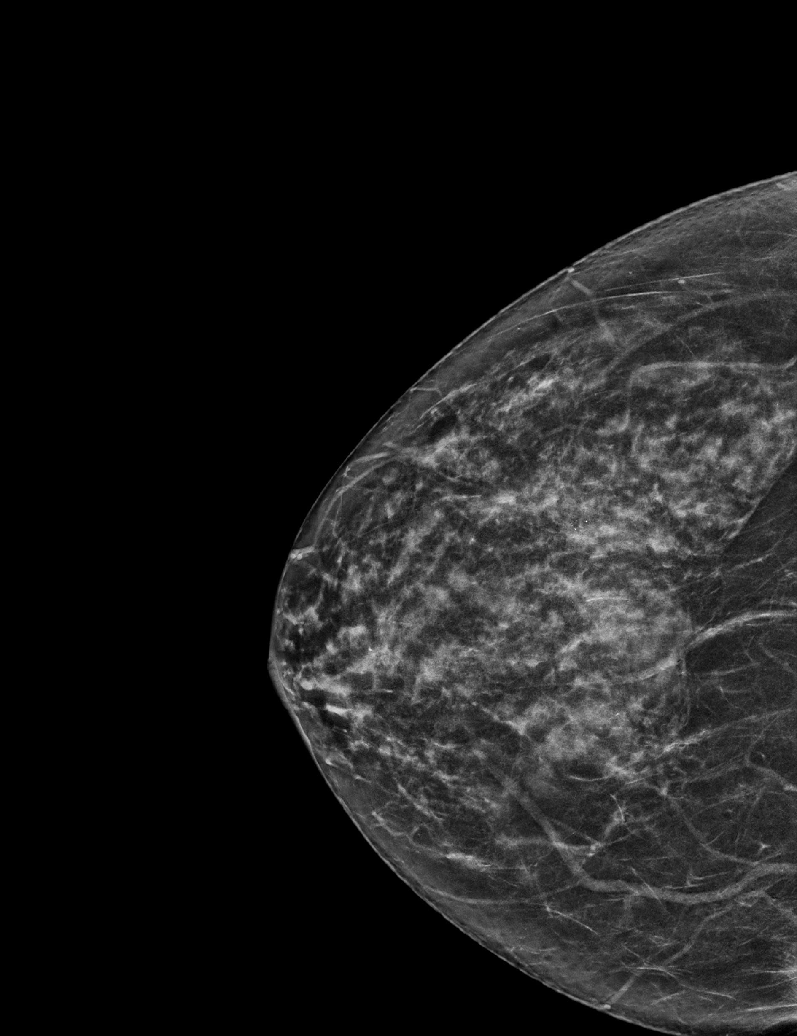

[L MLO synth-2D]
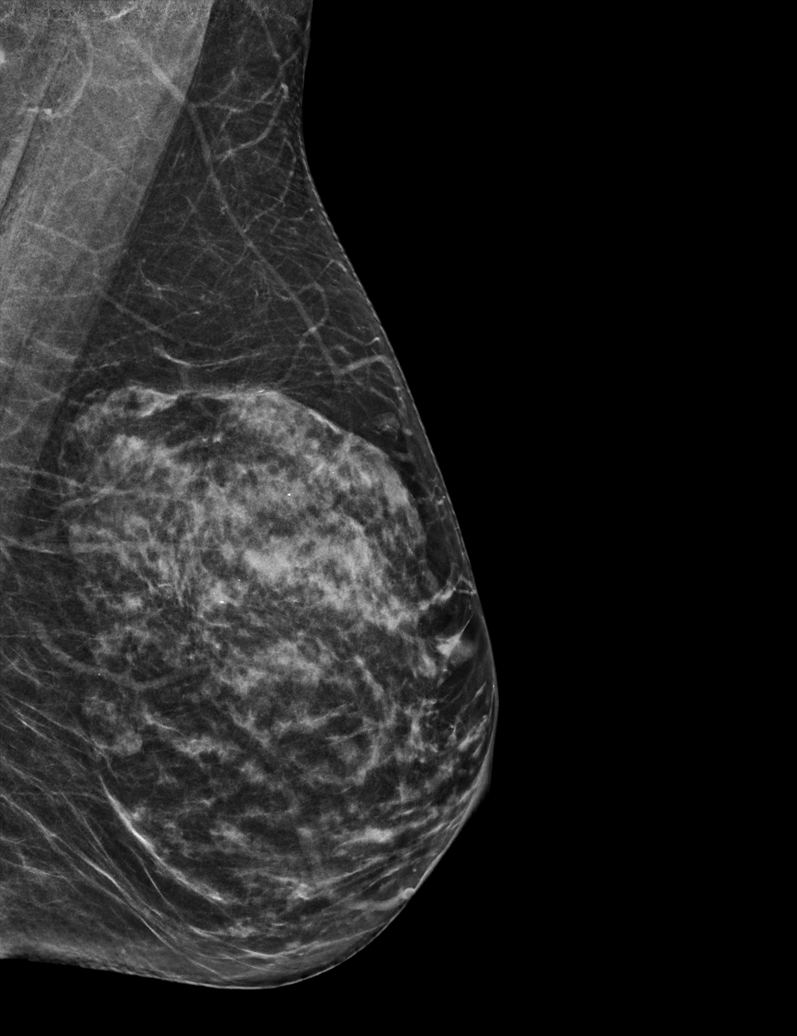

[R CC tomo · tomo slice 29/57.0]
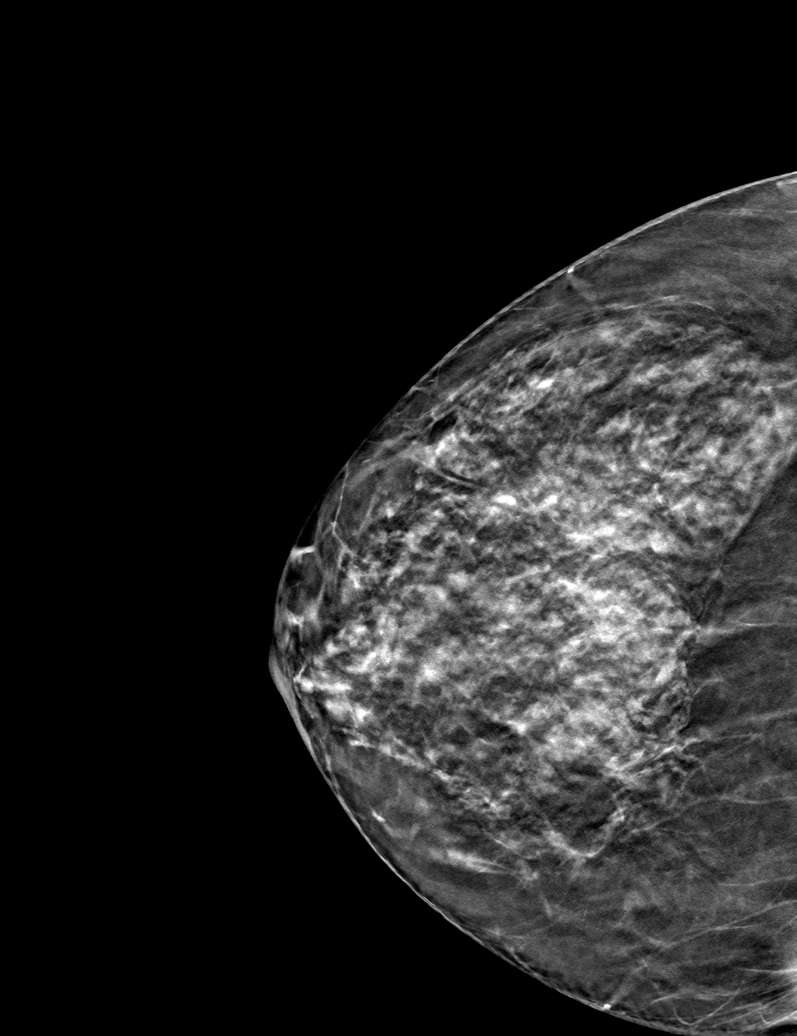

[6 of 30 positions shown; findings below may reference images not displayed]

ACR Breast Density Category c: The breast tissue is heterogeneously
dense, which may obscure small masses.
FINDINGS: There are no findings suspicious for malignancy.
IMPRESSION: No mammographic evidence of malignancy. A result letter of this
screening mammogram will be mailed directly to the patient.

RECOMMENDATION:
Screening mammogram in one year. (Code:[id number])

BI-RADS CATEGORY  1: Negative.

## 2022-08-15 ENCOUNTER — Encounter: Payer: Self-pay | Admitting: Gynecologic Oncology

## 2022-08-15 ENCOUNTER — Inpatient Hospital Stay: Payer: Self-pay | Attending: Gynecologic Oncology | Admitting: Gynecologic Oncology

## 2022-08-15 ENCOUNTER — Other Ambulatory Visit: Payer: Self-pay

## 2022-08-15 VITALS — BP 132/78 | HR 108 | Temp 98.5°F | Resp 16 | Wt 150.1 lb

## 2022-08-15 DIAGNOSIS — D071 Carcinoma in situ of vulva: Secondary | ICD-10-CM

## 2022-08-15 DIAGNOSIS — N951 Menopausal and female climacteric states: Secondary | ICD-10-CM

## 2022-08-15 DIAGNOSIS — Z7189 Other specified counseling: Secondary | ICD-10-CM

## 2022-08-15 DIAGNOSIS — Z9079 Acquired absence of other genital organ(s): Secondary | ICD-10-CM

## 2022-08-15 DIAGNOSIS — T8130XA Disruption of wound, unspecified, initial encounter: Secondary | ICD-10-CM

## 2022-08-15 NOTE — Progress Notes (Signed)
Gynecologic Oncology Return Clinic Visit  08/15/22  Reason for Visit: Follow-up after surgery, treatment planning  Treatment History: 07/12/22: Partial simple left vulvectomy, 2 right vulvar biopsies  Interval History: Improving slowly.  Denies any bleeding although had some mild bleeding from her incision last week.  Denies any drainage or discharge.  Pain has improved significantly although still has pain with pressing on the incision or when she goes from sitting to standing.  Endorses normal bowel and bladder function.  Does have significant vulvar burning when she urinates.  Is working to decrease tobacco use.  Past Medical/Surgical History: Past Medical History:  Diagnosis Date   Hidradenitis suppurativa    History of abnormal cervical Pap smear    approx 2003  s/p LEEP   VIN III (vulvar intraepithelial neoplasia III)    Wears glasses     Past Surgical History:  Procedure Laterality Date   NO PAST SURGERIES     VULVA /PERINEUM BIOPSY N/A 07/12/2022   Procedure: VULVAR BIOPSY;  Surgeon: Carver Fila, MD;  Location: [facility];  Service: Gynecology;  Laterality: N/A;   VULVECTOMY PARTIAL N/A 07/12/2022   Procedure: WIDE EXCISION VULVA;  Surgeon: Carver Fila, MD;  Location: [facility];  Service: Gynecology;  Laterality: N/A;    Family History  Problem Relation Age of Onset   Non-Hodgkin's lymphoma Mother 46   Colon cancer Neg Hx    Breast cancer Neg Hx    Ovarian cancer Neg Hx    Endometrial cancer Neg Hx    Pancreatic cancer Neg Hx    Prostate cancer Neg Hx     Social History   Socioeconomic History   Marital status: Single    Spouse name: Not on file   Number of children: 0   Years of education: Not on file   Highest education level: High school graduate  Occupational History   Not on file  Tobacco Use   Smoking status: Every Day    Packs/day: 1.50    Types: Cigarettes    Start date: 81   Smokeless tobacco:  Never  Vaping Use   Vaping Use: Never used  Substance and Sexual Activity   Alcohol use: Not Currently   Drug use: Never   Sexual activity: Not Currently    Birth control/protection: None  Other Topics Concern   Not on file  Social History Narrative   Not on file   Social Determinants of Health   Financial Resource Strain: Not on file  Food Insecurity: No Food Insecurity (03/17/2022)   Hunger Vital Sign    Worried About Running Out of Food in the Last Year: Never true    Ran Out of Food in the Last Year: Never true  Transportation Needs: No Transportation Needs (03/17/2022)   PRAPARE - Administrator, Civil Service (Medical): No    Lack of Transportation (Non-Medical): No  Physical Activity: Not on file  Stress: Not on file  Social Connections: Not on file    Current Medications:  Current Outpatient Medications:    citalopram (CELEXA) 20 MG tablet, Take 20 mg by mouth daily., Disp: , Rfl:    diazepam (VALIUM) 5 MG tablet, Take 1 tablet by mouth 2 (two) times daily., Disp: , Rfl:    dicyclomine (BENTYL) 20 MG tablet, Take 20 mg by mouth daily., Disp: , Rfl:    lidocaine (XYLOCAINE) 5 % ointment, Apply topically., Disp: , Rfl:    LORazepam (ATIVAN) 0.5 MG tablet,  Take 0.5 mg by mouth every 8 (eight) hours as needed., Disp: , Rfl:    triamcinolone ointment (KENALOG) 0.1 %, Apply topically., Disp: , Rfl:   Review of Systems: + Fatigue, cough, pain with intercourse, hot flashes, headache, anxiety Denies appetite changes, fevers, chills, unexplained weight changes. Denies hearing loss, neck lumps or masses, mouth sores, ringing in ears or voice changes. Denies wheezing.  Denies shortness of breath. Denies chest pain or palpitations. Denies leg swelling. Denies abdominal distention, pain, blood in stools, constipation, diarrhea, nausea, vomiting, or early satiety. Denies dysuria, frequency, hematuria or incontinence. Denies pelvic pain, vaginal bleeding or vaginal  discharge.   Denies joint pain, back pain or muscle pain/cramps. Denies itching, rash, or wounds. Denies dizziness, numbness or seizures. Denies swollen lymph nodes or glands, denies easy bruising or bleeding. Denies depression, confusion, or decreased concentration.  Physical Exam: BP 132/78 (BP Location: Left Arm, Patient Position: Sitting)   Pulse (!) 108   Temp 98.5 F (36.9 C) (Oral)   Resp 16   Wt 150 lb 2 oz (68.1 kg)   LMP 01/22/2017   SpO2 99%   BMI 22.83 kg/m  General: Alert, oriented, no acute distress. HEENT: Normocephalic, atraumatic, sclera anicteric. Chest: Unlabored breathing on room air.  Extremities: Grossly normal range of motion.  Warm, well perfused.  No edema bilaterally. Skin: No rashes or lesions noted.  GU: Left vulva has healed well.  Incision has healed completely by secondary and tent.  Very superior aspect of the incision stayed closed.  Several hairs removed from the midportion of the incision.  There is 1 suture still along the right medial aspect.  No erythema or induration, incision is still quite tender to touch.  Laboratory & Radiologic Studies: A. RIGHT VULVAR AT 10, BIOPSY:  - SQUAMOUS MUCOSA WITH HYPERPLASIA AND ACANTHOSIS.  - NEGATIVE FOR DYSPLASIA OR MALIGNANCY.   B. RIGHT VULVAR AT 11, BIOPSY:  - SQUAMOUS MUCOSA WITH HYPERPLASIA AND ACANTHOSIS.  - NEGATIVE FOR DYSPLASIA OR MALIGNANCY.   C. LEFT POSTERIOR VULVA, EXCISION:  - HIGH-GRADE SQUAMOUS INTRAEPITHELIAL LESION (HSIL, VIN 3) (SEE  COMMENT).  - SCAR TISSUE PRESENT CONSISTENT WITH PREVIOUS BIOPSY SITE.  - FOCAL SEBORRHEIC KERATOSIS.  - RESECTION MARGINS ARE NEGATIVE.  - NEGATIVE FOR INVASIVE CARCINOMA.   Assessment & Plan: Ana Curry is a 51 y.o. woman with VIN3 status post excisional procedure with negative margins.  Postoperative course complicated by wound dehiscence.  Patient is overall doing well.  Incision has healed completely although she remains somewhat  tender.  Discussed using squirt bottle still to wash urine off after she voids.  I suspect that her vulvar pain will significantly improve over the next 3-4 weeks.  I will plan to see her back in a month for follow-up as her OB/GYN is going to be unavailable for the next few months.  Patient brought up issues with anxiety and mood changes.  We reviewed that these may very well be related to menopause.  Discussed that these symptoms can be treated with medications that target anxiety/depression/mood.  Alternatively, the patient could be a future candidate for hormone replacement therapy; she is very motivated to stop smoking before any sort of discussion about estrogen replacement.  We discussed vaginal dryness, which may be contributing to her dyspareunia.  We reviewed the use of lubricants, specifically natural lubricants, and discussed the benefit of vaginal estrogen.  I encouraged her to reach out to her OB/GYN and/or primary care provider as she will be seeing  them long-term from a management standpoint.  We reviewed again today the risk of recurrence of vulvar dysplasia even after adequate treatment.  We discussed signs and symptoms that should prompt a phone call to her OB/GYN.  I recommended close follow-up with visits and a pelvic exam every 6 months initially.  28 minutes of total time was spent for this patient encounter, including preparation, face-to-face counseling with the patient and coordination of care, and documentation of the encounter.  Eugene Garnet, MD  Division of Gynecologic Oncology  Department of Obstetrics and Gynecology  [facility]

## 2022-08-15 NOTE — Patient Instructions (Signed)
It was good to see you today.  You are healing well from surgery.  I would continue to use a squirt bottle after you urinate since this is causing pain.  It is also okay for you to continue using the lidocaine.  Based on the risk of recurrence of precancer, I am recommending that you have follow-up visits with your OB/GYN every 6 months.  If you were to see a new spot or have new symptoms before between visits, please call to make a visit sooner.  I also encourage you to talk with either your primary care provider or your OB/GYN about medication for your anxiety and mood symptoms.  This could be with nonhormonal or hormonal medications, especially if you are able to stop smoking completely.

## 2022-09-20 ENCOUNTER — Telehealth: Payer: Self-pay

## 2022-09-20 NOTE — Telephone Encounter (Signed)
Pt called triage line stating she needed to cancel her appointment on 09/21/22 with Dr. Berline Lopes  I called her back and left a voicemail for her to call office and reschedule.

## 2022-09-21 ENCOUNTER — Inpatient Hospital Stay: Payer: No Typology Code available for payment source | Admitting: Gynecologic Oncology

## 2022-09-21 DIAGNOSIS — D071 Carcinoma in situ of vulva: Secondary | ICD-10-CM

## 2022-09-21 NOTE — Telephone Encounter (Signed)
Voicemail left for patient to call office to reschedule appointment she cancelled for today with Dr. Berline Lopes

## 2022-09-25 ENCOUNTER — Telehealth: Payer: Self-pay

## 2022-09-25 NOTE — Telephone Encounter (Signed)
Ana Curry cancelled her appointment from last week. She states she is feeling fine with no problems from incision. Per Dr. Charisse March last note (08/15/22) pt can start seeing her OB/GYN every 6 months unless she has concerns.  Pt will call her OB/GYN and get an appointment with them.  Dr. Berline Lopes notified

## 2022-10-29 ENCOUNTER — Other Ambulatory Visit: Payer: Self-pay

## 2022-10-29 ENCOUNTER — Encounter (HOSPITAL_COMMUNITY): Payer: Self-pay

## 2022-10-29 ENCOUNTER — Emergency Department (HOSPITAL_COMMUNITY)
Admission: EM | Admit: 2022-10-29 | Discharge: 2022-10-29 | Disposition: A | Discharge status: Home / Self Care | Payer: Medicaid Other | Attending: Emergency Medicine | Admitting: Emergency Medicine

## 2022-10-29 DIAGNOSIS — R61 Generalized hyperhidrosis: Secondary | ICD-10-CM | POA: Diagnosis present

## 2022-10-29 DIAGNOSIS — E860 Dehydration: Secondary | ICD-10-CM

## 2022-10-29 DIAGNOSIS — E162 Hypoglycemia, unspecified: Secondary | ICD-10-CM | POA: Diagnosis not present

## 2022-10-29 LAB — BASIC METABOLIC PANEL
Anion gap: 9 (ref 5–15)
BUN: 10 mg/dL (ref 6–20)
CO2: 26 mmol/L (ref 22–32)
Calcium: 8.8 mg/dL — ABNORMAL LOW (ref 8.9–10.3)
Chloride: 106 mmol/L (ref 98–111)
Creatinine, Ser: 0.96 mg/dL (ref 0.44–1.00)
GFR, Estimated: >60 mL/min (ref >60)
Glucose, Bld: 47 mg/dL — ABNORMAL LOW (ref 70–99)
Potassium: 3.5 mmol/L (ref 3.5–5.1)
Sodium: 141 mmol/L (ref 135–145)

## 2022-10-29 LAB — CBC
HCT: 40.7 % (ref 36.0–46.0)
Hemoglobin: 13.9 g/dL (ref 12.0–15.0)
MCH: 33.5 pg (ref 26.0–34.0)
MCHC: 34.2 g/dL (ref 30.0–36.0)
MCV: 98.1 fL (ref 80.0–100.0)
Platelets: 174 10*3/uL (ref 150–400)
RBC: 4.15 MIL/uL (ref 3.87–5.11)
RDW: 13 % (ref 11.5–15.5)
WBC: 7.9 10*3/uL (ref 4.0–10.5)
nRBC: 0 % (ref 0.0–0.2)

## 2022-10-29 LAB — URINALYSIS, ROUTINE W REFLEX MICROSCOPIC
Bilirubin Urine: NEGATIVE
Glucose, UA: 50 mg/dL — AB
Ketones, ur: NEGATIVE mg/dL
Leukocytes,Ua: NEGATIVE
Nitrite: NEGATIVE
Protein, ur: NEGATIVE mg/dL
Specific Gravity, Urine: 1.009 (ref 1.005–1.030)
pH: 7 (ref 5.0–8.0)

## 2022-10-29 LAB — CBG MONITORING, ED: Glucose-Capillary: 97 mg/dL (ref 70–99)

## 2022-10-29 MED ORDER — SODIUM CHLORIDE 0.9 % IV BOLUS
1000.0000 mL | Freq: Once | INTRAVENOUS | Status: AC
  Administered 2022-10-29: 1000 mL via INTRAVENOUS

## 2022-10-29 MED ORDER — DEXTROSE 50 % IV SOLN: 0.5000 | Freq: Once | INTRAVENOUS | Status: DC

## 2022-10-29 NOTE — Discharge Instructions (Signed)
Evaluation was overall reassuring.  Your vitals normalized with volume resuscitation.  As we discussed, you can resume a half tab daily of your normal dose of citalopram and would recommend follow-up with your PCP.  You have new shortness of breath, chest pain, slurred speech, facial droop or weakness or numbness in your extremities or any other concerning symptom please return to the emergency department further evaluation.

## 2022-10-29 NOTE — ED Triage Notes (Signed)
Complaining of withdrawing from citalopram. Said she was taking them for menopause and stopped abruptly 1 1/2 weeks ago. Went to [facility] Friday and had one dose but is now shaking, hallucinating, and is extremely thirsty.

## 2022-10-29 NOTE — ED Notes (Signed)
ED Provider at bedside. 

## 2022-10-29 NOTE — ED Provider Notes (Signed)
[facility] Provider Note   CSN: [id number] Arrival date & time: 10/29/22  1839     History  Chief Complaint  Patient presents with   Withdrawal   HPI Ana Curry is a 52 y.o. female presenting for concern of withdrawal symptoms.  Patient states she stopped taking citalopram abruptly 1 and half weeks ago.  She was taken for refractory metabolic symptoms.  She stopped it due to excessive sweating.  She is now endorsing a week and a half of hallucinations, paranoia, diaphoresis and insomnia.  She also concerned that she may be dehydrated as her urine is dark yellow.  She denies SI and HI.  Denies shortness of breath or chest pain.  HPI     Home Medications Prior to Admission medications   Medication Sig Start Date End Date Taking? Authorizing Provider  citalopram (CELEXA) 20 MG tablet Take 20 mg by mouth daily. 06/27/22   [provider]  diazepam (VALIUM) 5 MG tablet Take 1 tablet by mouth 2 (two) times daily. 07/31/22   [provider]  dicyclomine (BENTYL) 20 MG tablet Take 20 mg by mouth daily.    [provider]  lidocaine (XYLOCAINE) 5 % ointment Apply topically. 08/01/22 08/01/23  [provider]  LORazepam (ATIVAN) 0.5 MG tablet Take 0.5 mg by mouth every 8 (eight) hours as needed. 06/27/22   [provider]  triamcinolone ointment (KENALOG) 0.1 % Apply topically. 08/01/22 08/01/23  [provider]      Allergies    Codeine    Review of Systems   Review of Systems  Constitutional:  Positive for diaphoresis.  Psychiatric/Behavioral:  Positive for hallucinations and sleep disturbance.     Physical Exam   Vitals:   10/29/22 2050 10/29/22 2100  BP: 113/69 112/65  Pulse: 89 93  Resp:  20  Temp:    SpO2: 93% 95%    CONSTITUTIONAL:  well-appearing, NAD NEURO: GCS 15. Speech is goal oriented. No deficits appreciated to CN III-XII; symmetric eyebrow raise, no  facial drooping, tongue midline. Patient has equal grip strength bilaterally with 5/5 strength against resistance in all major muscle groups bilaterally. Sensation to light touch intact. Patient moves extremities without ataxia. Normal finger-nose-finger. Patient ambulatory with steady gait. EYES:  eyes equal and reactive ENT/NECK:  Supple, no stridor, dry mucous membranes CARDIO:  tachycardica, regular rhythm, appears well-perfused  PULM:  No respiratory distress, CTAB GI/GU:  non-distended, soft MSK/SPINE:  No gross deformities, no edema, moves all extremities  SKIN:  no rash, atraumatic   *Additional and/or pertinent findings included in MDM below    ED Results / Procedures / Treatments   Labs (all labs ordered are listed, but only abnormal results are displayed) Labs Reviewed  URINALYSIS, ROUTINE W REFLEX MICROSCOPIC - Abnormal; Notable for the following components:      Result Value   Glucose, UA 50 (*)    Hgb urine dipstick SMALL (*)    Bacteria, UA RARE (*)    All other components within normal limits  BASIC METABOLIC PANEL - Abnormal; Notable for the following components:   Glucose, Bld 47 (*)    Calcium 8.8 (*)    All other components within normal limits  CBC  CBG MONITORING, ED    EKG Revealed NSR  Radiology No results found.  Procedures Procedures    Medications Ordered in ED Medications  dextrose 50 % solution 25 mL (25 mLs Intravenous Not Given 10/29/22 2308)  sodium chloride 0.9 % bolus 1,000 mL (0 mLs Intravenous Stopped 10/29/22 2251)    ED Course/ Medical Decision Making/ A&P Clinical Course as of 10/29/22 2314  Sun Oct 29, 2022  2259 WBC: 7.9 [JR]    Clinical Course User Index [JR] Harriet Pho, PA-C                             Medical Decision Making Amount and/or Complexity of Data Reviewed Labs: ordered. Decision-making details documented in ED Course.  Risk Prescription drug management.   52 year old female who is  well-appearing and hemodynamically stable presenting for withdrawal concerns status post abruptly stopping citalopram 2 weeks ago. Exam notable for tachycardia and dry mucous membranes. Findings concerning for dehydration. Treated with normal saline bolus. Heart rate improved and patient stated she felt better after treatment. Labs today reveal hypoglycemia. Patient ate a candy bar and recheck was 97. Patient states she still has a supply of citalopram at home.  I advised her to resume half of her normal dose when she returns home and follow-up with her PCP to discuss a possible taper plan as she does have a strong desire to discontinue the medicine. I also advised that since she is 2 weeks out from discontinue the medicine, symptoms might start to improve and emphasized that she continue good hydration and good nutrition at home. Considered ACS but unlikely given no chest pain shortness of breath.  Considered stroke and infection but symptoms and objective data are not consistent with these as etiology.  Discharged stable vitals.  Discussed return precautions.        Final Clinical Impression(s) / ED Diagnoses Final diagnoses:  Dehydration    Rx / DC Orders ED Discharge Orders     None         Harriet Pho, PA-C 10/29/22 2315    Milton Ferguson, MD 10/31/22 1615

## 2022-11-14 LAB — FECAL OCCULT BLOOD, IMMUNOCHEMICAL

## 2022-11-15 ENCOUNTER — Telehealth: Payer: Self-pay

## 2022-11-15 NOTE — Telephone Encounter (Signed)
Patient informed FIT test could not be performed due to expired kit/tube. Patient verbalized understanding, stated she forgot she had the test, found it her cabinet, completed and sent in at the advice of a friend. Patient offered another FIT test kit  when due for [facility] in 03/2023, but she stated she now has medicaid. Patient informed to contact her pcp and have them refer her to GI for colonoscopy, verbalized understanding.

## 2022-11-23 ENCOUNTER — Telehealth: Payer: Self-pay | Admitting: *Deleted

## 2022-11-23 NOTE — Telephone Encounter (Signed)
Patient called and stated "I saw Dr Berline Lopes back in December after my surgery. Can I just follow up with my OB/GYN  Dr Evie Lacks. Is there anything different that Dr Berline Lopes will do that Dr Evie Lacks will not do." Explained that per Dr Charisse March last note, patient can follow up with Dr Evie Lacks every six months. Explained that Dr Evie Lacks and Dr Berline Lopes will both do the same exams. Explained that Dr Evie Lacks will send her back to Dr Berline Lopes if needed.

## 2023-02-07 ENCOUNTER — Ambulatory Visit: Payer: No Typology Code available for payment source

## 2023-02-23 ENCOUNTER — Emergency Department (HOSPITAL_COMMUNITY): Payer: Medicaid Other

## 2023-02-23 ENCOUNTER — Other Ambulatory Visit: Payer: Self-pay

## 2023-02-23 ENCOUNTER — Inpatient Hospital Stay (HOSPITAL_COMMUNITY)
Admission: EM | Admit: 2023-02-23 | Discharge: 2023-03-05 | DRG: 166 | Disposition: A | Discharge status: Home / Self Care | Payer: Medicaid Other | Source: Ambulatory Visit | Attending: Internal Medicine | Admitting: Internal Medicine

## 2023-02-23 ENCOUNTER — Encounter (HOSPITAL_COMMUNITY): Payer: Self-pay | Admitting: Emergency Medicine

## 2023-02-23 DIAGNOSIS — D61818 Other pancytopenia: Secondary | ICD-10-CM | POA: Diagnosis present

## 2023-02-23 DIAGNOSIS — I480 Paroxysmal atrial fibrillation: Secondary | ICD-10-CM | POA: Diagnosis present

## 2023-02-23 DIAGNOSIS — C3411 Malignant neoplasm of upper lobe, right bronchus or lung: Principal | ICD-10-CM | POA: Diagnosis present

## 2023-02-23 DIAGNOSIS — E876 Hypokalemia: Secondary | ICD-10-CM | POA: Diagnosis present

## 2023-02-23 DIAGNOSIS — E872 Acidosis, unspecified: Secondary | ICD-10-CM | POA: Diagnosis present

## 2023-02-23 DIAGNOSIS — D65 Disseminated intravascular coagulation [defibrination syndrome]: Secondary | ICD-10-CM | POA: Diagnosis present

## 2023-02-23 DIAGNOSIS — Z5309 Procedure and treatment not carried out because of other contraindication: Secondary | ICD-10-CM | POA: Diagnosis present

## 2023-02-23 DIAGNOSIS — J449 Chronic obstructive pulmonary disease, unspecified: Secondary | ICD-10-CM | POA: Diagnosis present

## 2023-02-23 DIAGNOSIS — Z807 Family history of other malignant neoplasms of lymphoid, hematopoietic and related tissues: Secondary | ICD-10-CM | POA: Diagnosis not present

## 2023-02-23 DIAGNOSIS — J441 Chronic obstructive pulmonary disease with (acute) exacerbation: Secondary | ICD-10-CM | POA: Insufficient documentation

## 2023-02-23 DIAGNOSIS — R9389 Abnormal findings on diagnostic imaging of other specified body structures: Secondary | ICD-10-CM

## 2023-02-23 DIAGNOSIS — Z885 Allergy status to narcotic agent status: Secondary | ICD-10-CM | POA: Diagnosis not present

## 2023-02-23 DIAGNOSIS — C3491 Malignant neoplasm of unspecified part of right bronchus or lung: Secondary | ICD-10-CM | POA: Insufficient documentation

## 2023-02-23 DIAGNOSIS — D696 Thrombocytopenia, unspecified: Principal | ICD-10-CM | POA: Insufficient documentation

## 2023-02-23 DIAGNOSIS — J9601 Acute respiratory failure with hypoxia: Secondary | ICD-10-CM | POA: Diagnosis present

## 2023-02-23 DIAGNOSIS — Z79899 Other long term (current) drug therapy: Secondary | ICD-10-CM

## 2023-02-23 DIAGNOSIS — E883 Tumor lysis syndrome: Secondary | ICD-10-CM | POA: Diagnosis present

## 2023-02-23 DIAGNOSIS — R918 Other nonspecific abnormal finding of lung field: Secondary | ICD-10-CM | POA: Diagnosis not present

## 2023-02-23 DIAGNOSIS — C760 Malignant neoplasm of head, face and neck: Secondary | ICD-10-CM

## 2023-02-23 DIAGNOSIS — C3431 Malignant neoplasm of lower lobe, right bronchus or lung: Secondary | ICD-10-CM

## 2023-02-23 DIAGNOSIS — F17210 Nicotine dependence, cigarettes, uncomplicated: Secondary | ICD-10-CM | POA: Diagnosis present

## 2023-02-23 DIAGNOSIS — R0602 Shortness of breath: Secondary | ICD-10-CM | POA: Diagnosis present

## 2023-02-23 DIAGNOSIS — C770 Secondary and unspecified malignant neoplasm of lymph nodes of head, face and neck: Secondary | ICD-10-CM | POA: Diagnosis present

## 2023-02-23 DIAGNOSIS — I4891 Unspecified atrial fibrillation: Secondary | ICD-10-CM | POA: Diagnosis present

## 2023-02-23 DIAGNOSIS — R0902 Hypoxemia: Secondary | ICD-10-CM

## 2023-02-23 DIAGNOSIS — J90 Pleural effusion, not elsewhere classified: Secondary | ICD-10-CM | POA: Diagnosis present

## 2023-02-23 DIAGNOSIS — Z87412 Personal history of vulvar dysplasia: Secondary | ICD-10-CM | POA: Diagnosis not present

## 2023-02-23 DIAGNOSIS — C3490 Malignant neoplasm of unspecified part of unspecified bronchus or lung: Secondary | ICD-10-CM

## 2023-02-23 LAB — COMPREHENSIVE METABOLIC PANEL
ALT: 27 U/L (ref 0–44)
AST: 58 U/L — ABNORMAL HIGH (ref 15–41)
Albumin: 4 g/dL (ref 3.5–5.0)
Alkaline Phosphatase: 203 U/L — ABNORMAL HIGH (ref 38–126)
Anion gap: 15 (ref 5–15)
BUN: 18 mg/dL (ref 6–20)
CO2: 20 mmol/L — ABNORMAL LOW (ref 22–32)
Calcium: 10.1 mg/dL (ref 8.9–10.3)
Chloride: 98 mmol/L (ref 98–111)
Creatinine, Ser: 1.18 mg/dL — ABNORMAL HIGH (ref 0.44–1.00)
GFR, Estimated: 56 mL/min — ABNORMAL LOW (ref >60)
Glucose, Bld: 130 mg/dL — ABNORMAL HIGH (ref 70–99)
Potassium: 3.2 mmol/L — ABNORMAL LOW (ref 3.5–5.1)
Sodium: 133 mmol/L — ABNORMAL LOW (ref 135–145)
Total Bilirubin: 0.9 mg/dL (ref 0.3–1.2)
Total Protein: 7.3 g/dL (ref 6.5–8.1)

## 2023-02-23 LAB — BASIC METABOLIC PANEL
Anion gap: 12 (ref 5–15)
BUN: 15 mg/dL (ref 6–20)
CO2: 20 mmol/L — ABNORMAL LOW (ref 22–32)
Calcium: 9.5 mg/dL (ref 8.9–10.3)
Chloride: 103 mmol/L (ref 98–111)
Creatinine, Ser: 1.16 mg/dL — ABNORMAL HIGH (ref 0.44–1.00)
GFR, Estimated: 57 mL/min — ABNORMAL LOW (ref >60)
Glucose, Bld: 150 mg/dL — ABNORMAL HIGH (ref 70–99)
Potassium: 3.4 mmol/L — ABNORMAL LOW (ref 3.5–5.1)
Sodium: 135 mmol/L (ref 135–145)

## 2023-02-23 LAB — CBC WITH DIFFERENTIAL/PLATELET
Abs Immature Granulocytes: 0 10*3/uL (ref 0.00–0.07)
Band Neutrophils: 9 %
Basophils Absolute: 0.1 10*3/uL (ref 0.0–0.1)
Basophils Relative: 1 %
Eosinophils Absolute: 0.1 10*3/uL (ref 0.0–0.5)
Eosinophils Relative: 1 %
HCT: 39.9 % (ref 36.0–46.0)
Hemoglobin: 13.8 g/dL (ref 12.0–15.0)
Lymphocytes Relative: 20 %
Lymphs Abs: 1.6 10*3/uL (ref 0.7–4.0)
MCH: 32.9 pg (ref 26.0–34.0)
MCHC: 34.6 g/dL (ref 30.0–36.0)
MCV: 95 fL (ref 80.0–100.0)
Monocytes Absolute: 0.2 10*3/uL (ref 0.1–1.0)
Monocytes Relative: 3 %
Neutro Abs: 6 10*3/uL (ref 1.7–7.7)
Neutrophils Relative %: 66 %
Platelets: 50 10*3/uL — ABNORMAL LOW (ref 150–400)
RBC: 4.2 MIL/uL (ref 3.87–5.11)
RDW: 14 % (ref 11.5–15.5)
WBC: 8 10*3/uL (ref 4.0–10.5)
nRBC: 0.4 % — ABNORMAL HIGH (ref 0.0–0.2)

## 2023-02-23 LAB — LACTATE DEHYDROGENASE: LDH: 571 U/L — ABNORMAL HIGH (ref 98–192)

## 2023-02-23 LAB — TSH: TSH: 3.448 u[IU]/mL (ref 0.350–4.500)

## 2023-02-23 LAB — URINALYSIS, W/ REFLEX TO CULTURE (INFECTION SUSPECTED)
Bilirubin Urine: NEGATIVE
Glucose, UA: NEGATIVE mg/dL
Ketones, ur: NEGATIVE mg/dL
Leukocytes,Ua: NEGATIVE
Nitrite: NEGATIVE
Protein, ur: 30 mg/dL — AB
Specific Gravity, Urine: >1.046 — ABNORMAL HIGH (ref 1.005–1.030)
pH: 5 (ref 5.0–8.0)

## 2023-02-23 LAB — LACTIC ACID, PLASMA
Lactic Acid, Venous: 3 mmol/L (ref 0.5–1.9)
Lactic Acid, Venous: 2.4 mmol/L (ref 0.5–1.9)
Lactic Acid, Venous: 4 mmol/L (ref 0.5–1.9)

## 2023-02-23 LAB — PROTIME-INR
INR: 1.1 (ref 0.8–1.2)
Prothrombin Time: 14 seconds (ref 11.4–15.2)

## 2023-02-23 LAB — URIC ACID: Uric Acid, Serum: 10.5 mg/dL — ABNORMAL HIGH (ref 2.5–7.1)

## 2023-02-23 MED ORDER — POTASSIUM CHLORIDE CRYS ER 20 MEQ PO TBCR
40.0000 meq | EXTENDED_RELEASE_TABLET | Freq: Once | ORAL | Status: AC
  Administered 2023-02-23: 40 meq via ORAL
  Filled 2023-02-23: qty 2

## 2023-02-23 MED ORDER — METHYLPREDNISOLONE SODIUM SUCC 125 MG IJ SOLR
125.0000 mg | Freq: Once | INTRAMUSCULAR | Status: AC
  Administered 2023-02-23: 125 mg via INTRAVENOUS
  Filled 2023-02-23: qty 2

## 2023-02-23 MED ORDER — MAGNESIUM SULFATE 2 GM/50ML IV SOLN
2.0000 g | Freq: Once | INTRAVENOUS | Status: AC
  Administered 2023-02-23: 2 g via INTRAVENOUS
  Filled 2023-02-23: qty 50

## 2023-02-23 MED ORDER — METOPROLOL TARTRATE 25 MG PO TABS
25.0000 mg | ORAL_TABLET | Freq: Two times a day (BID) | ORAL | Status: DC
  Administered 2023-02-23 – 2023-03-02 (×14): 25 mg via ORAL
  Filled 2023-02-23 (×14): qty 1

## 2023-02-23 MED ORDER — DILTIAZEM LOAD VIA INFUSION
15.0000 mg | Freq: Once | INTRAVENOUS | Status: DC
  Filled 2023-02-23: qty 15

## 2023-02-23 MED ORDER — ONDANSETRON HCL 4 MG PO TABS: 4.0000 mg | ORAL_TABLET | Freq: Four times a day (QID) | ORAL | Status: DC | PRN

## 2023-02-23 MED ORDER — METOCLOPRAMIDE HCL 5 MG/ML IJ SOLN
10.0000 mg | Freq: Once | INTRAMUSCULAR | Status: AC
  Administered 2023-02-23: 10 mg via INTRAVENOUS
  Filled 2023-02-23: qty 2

## 2023-02-23 MED ORDER — LORAZEPAM 2 MG/ML IJ SOLN
INTRAMUSCULAR | Status: AC
  Filled 2023-02-23: qty 1

## 2023-02-23 MED ORDER — ALBUTEROL SULFATE (2.5 MG/3ML) 0.083% IN NEBU: 5.0000 mg | INHALATION_SOLUTION | RESPIRATORY_TRACT | Status: AC

## 2023-02-23 MED ORDER — AMIODARONE LOAD VIA INFUSION
150.0000 mg | Freq: Once | INTRAVENOUS | Status: AC
  Administered 2023-02-23: 150 mg via INTRAVENOUS
  Filled 2023-02-23: qty 83.34

## 2023-02-23 MED ORDER — ALBUTEROL SULFATE (2.5 MG/3ML) 0.083% IN NEBU
2.5000 mg | INHALATION_SOLUTION | RESPIRATORY_TRACT | Status: DC | PRN
  Administered 2023-02-23: 2.5 mg via RESPIRATORY_TRACT
  Filled 2023-02-23: qty 3

## 2023-02-23 MED ORDER — NICOTINE 21 MG/24HR TD PT24
21.0000 mg | MEDICATED_PATCH | Freq: Every day | TRANSDERMAL | Status: DC
  Administered 2023-02-24 – 2023-03-05 (×10): 21 mg via TRANSDERMAL
  Filled 2023-02-23 (×10): qty 1

## 2023-02-23 MED ORDER — SODIUM CHLORIDE 0.9 % IV BOLUS
500.0000 mL | Freq: Once | INTRAVENOUS | Status: AC
  Administered 2023-02-23: 500 mL via INTRAVENOUS

## 2023-02-23 MED ORDER — HYDROCODONE-ACETAMINOPHEN 5-325 MG PO TABS
1.0000 | ORAL_TABLET | Freq: Every day | ORAL | Status: DC | PRN
  Administered 2023-02-24 – 2023-02-27 (×4): 1 via ORAL
  Filled 2023-02-23 (×5): qty 1

## 2023-02-23 MED ORDER — IPRATROPIUM-ALBUTEROL 0.5-2.5 (3) MG/3ML IN SOLN
3.0000 mL | Freq: Four times a day (QID) | RESPIRATORY_TRACT | Status: DC
  Administered 2023-02-24: 3 mL via RESPIRATORY_TRACT
  Filled 2023-02-23: qty 3

## 2023-02-23 MED ORDER — ALBUTEROL SULFATE HFA 108 (90 BASE) MCG/ACT IN AERS: 2.0000 | INHALATION_SPRAY | RESPIRATORY_TRACT | Status: DC | PRN

## 2023-02-23 MED ORDER — AMIODARONE HCL IN DEXTROSE 360-4.14 MG/200ML-% IV SOLN
30.0000 mg/h | INTRAVENOUS | Status: DC
  Administered 2023-02-24: 30 mg/h via INTRAVENOUS
  Filled 2023-02-23: qty 200

## 2023-02-23 MED ORDER — METHYLPREDNISOLONE SODIUM SUCC 125 MG IJ SOLR
60.0000 mg | Freq: Two times a day (BID) | INTRAMUSCULAR | Status: DC
  Administered 2023-02-24 – 2023-02-26 (×5): 60 mg via INTRAVENOUS
  Filled 2023-02-23 (×7): qty 2

## 2023-02-23 MED ORDER — DM-GUAIFENESIN ER 30-600 MG PO TB12: 1.0000 | ORAL_TABLET | Freq: Two times a day (BID) | ORAL | Status: DC | PRN

## 2023-02-23 MED ORDER — AMIODARONE HCL IN DEXTROSE 360-4.14 MG/200ML-% IV SOLN
60.0000 mg/h | INTRAVENOUS | Status: AC
  Administered 2023-02-23 (×2): 60 mg/h via INTRAVENOUS
  Filled 2023-02-23 (×2): qty 200

## 2023-02-23 MED ORDER — LORAZEPAM 2 MG/ML IJ SOLN
1.0000 mg | Freq: Once | INTRAMUSCULAR | Status: AC
  Administered 2023-02-23: 1 mg via INTRAVENOUS
  Filled 2023-02-23: qty 1

## 2023-02-23 MED ORDER — IOHEXOL 350 MG/ML SOLN
75.0000 mL | Freq: Once | INTRAVENOUS | Status: AC | PRN
  Administered 2023-02-23: 75 mL via INTRAVENOUS

## 2023-02-23 MED ORDER — POLYETHYLENE GLYCOL 3350 17 G PO PACK: 17.0000 g | PACK | Freq: Every day | ORAL | Status: DC | PRN

## 2023-02-23 MED ORDER — FLUTICASONE FUROATE-VILANTEROL 100-25 MCG/ACT IN AEPB
1.0000 | INHALATION_SPRAY | Freq: Every day | RESPIRATORY_TRACT | Status: DC
  Administered 2023-02-26 – 2023-03-05 (×7): 1 via RESPIRATORY_TRACT
  Filled 2023-02-23 (×2): qty 28

## 2023-02-23 MED ORDER — DILTIAZEM HCL-DEXTROSE 125-5 MG/125ML-% IV SOLN (PREMIX)
5.0000 mg/h | INTRAVENOUS | Status: DC
  Administered 2023-02-23: 7.5 mg/h via INTRAVENOUS
  Administered 2023-02-23: 10 mg/h via INTRAVENOUS
  Administered 2023-02-23: 5 mg/h via INTRAVENOUS
  Filled 2023-02-23: qty 125

## 2023-02-23 MED ORDER — SODIUM CHLORIDE 0.9 % IV BOLUS
1000.0000 mL | Freq: Once | INTRAVENOUS | Status: AC
  Administered 2023-02-23: 1000 mL via INTRAVENOUS

## 2023-02-23 MED ORDER — LORAZEPAM 2 MG/ML IJ SOLN
0.5000 mg | Freq: Three times a day (TID) | INTRAMUSCULAR | Status: DC | PRN
  Administered 2023-02-23 – 2023-03-05 (×9): 0.5 mg via INTRAVENOUS
  Filled 2023-02-23 (×10): qty 1

## 2023-02-23 MED ORDER — ONDANSETRON HCL 4 MG/2ML IJ SOLN: 4.0000 mg | Freq: Four times a day (QID) | INTRAMUSCULAR | Status: DC | PRN

## 2023-02-23 MED ORDER — DICYCLOMINE HCL 20 MG PO TABS
20.0000 mg | ORAL_TABLET | Freq: Every day | ORAL | Status: DC
  Administered 2023-02-24 – 2023-03-05 (×9): 20 mg via ORAL
  Filled 2023-02-23 (×10): qty 1

## 2023-02-23 NOTE — ED Notes (Signed)
Both sets of blood cultures drawn before any antibiotic administration  

## 2023-02-23 NOTE — ED Notes (Signed)
Pt placed on 2 L via [place] for SpO2 of 90-91%
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
## 2023-02-23 NOTE — ED Notes (Signed)
Purwick placed per day shift handoff Pt going to [facility].
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
## 2023-02-23 NOTE — ED Notes (Signed)
MD made aware of BP of 87/61 (70) after titrating Cardizem drip up to 7.5, HR has improved from 140s to 127. Fluid bolus still going

## 2023-02-23 NOTE — ED Triage Notes (Signed)
Pt c/o SOB. She went to [facility] this morning and O2 saturation in office was 92% on room air. Pt has been taking prednisone for breathing difficulties x 1.5 months after pneumonia and bronchitis dx. Tachycardia noted in triage. Decreased lung sounds bilaterally.

## 2023-02-23 NOTE — ED Notes (Addendum)
Pt resting.

## 2023-02-23 NOTE — ED Notes (Signed)
MD made aware of pts HR trending up, current HR 144

## 2023-02-23 NOTE — ED Provider Notes (Signed)
Ultrasound ED Peripheral IV (Provider)  Date/Time: 02/23/2023 6:59 PM  Performed by: Gareth Eagle, PA-C Authorized by: Gareth Eagle, PA-C   Procedure details:    Indications: multiple failed IV attempts     Skin Prep: isopropyl alcohol     Location:  Right AC   Angiocath:  20 G   Bedside Ultrasound Guided: Yes     Images: not archived     Patient tolerated procedure without complications: Yes     Dressing applied: Yes       Gareth Eagle, PA-C 02/23/23 1859    Lonell Grandchild, MD 02/23/23 2220

## 2023-02-23 NOTE — H&P (Signed)
History and Physical    Patient: Ana Curry:[id number] DOB: 12/12/1970 DOA: 02/23/2023 DOS: the patient was seen and examined on 02/23/2023 PCP: [facility]  Patient coming from: Home  Chief Complaint:  Chief Complaint  Patient presents with   Shortness of Breath   HPI: Ana Curry is a 52 y.o. female with medical history significant of VIN3 s/p partial vulvectomy, tobacco use (about 57 pack year history).  She reports approximately 2 months of worsening shortness of breath, especially dyspnea on exertion.  She saw her primary care doctor in [place] several times.  She has been treated with a couple rounds of antibiotics after being diagnosed with pneumonia on chest x-ray.  She also went through a round of steroids with a long taper that ended on Sunday.  The patient has been wheezing with a wheezy cough that is largely nonproductive.  She saw her primary care physician today who noted a right neck mass which has come up over the past several days she was instructed to come to the hospital to be evaluated.    The patient is unable to ambulate long distances due to the shortness of breath.  She does not feel that her heart rate is fast.  She has never had an irregular heartbeat. No history of DVT/PE  Here in the ED, she was started on oxygen via [place]. She had a CXR and CT scan. The CT scan showed a right infrahilar/hilar mass that extends around the right mainstem bronchus, the right upper lobe and all the way to the thoracic inlet. It is confluent to bulky level IV adenopathy.  In addition, she was found to be in A fib with RVR. Cardizem was started.  Review of Systems: As mentioned in the history of present illness. All other systems reviewed and are negative. Past Medical History:  Diagnosis Date   Hidradenitis suppurativa    History of abnormal cervical Pap smear    approx 2003  s/p LEEP   VIN III (vulvar intraepithelial neoplasia III)    Wears glasses     Past Surgical History:  Procedure Laterality Date   NO PAST SURGERIES     VULVA /PERINEUM BIOPSY N/A 07/12/2022   Procedure: VULVAR BIOPSY;  Surgeon: Carver Fila, MD;  Location: [facility];  Service: Gynecology;  Laterality: N/A;   VULVECTOMY PARTIAL N/A 07/12/2022   Procedure: WIDE EXCISION VULVA;  Surgeon: Carver Fila, MD;  Location: [facility];  Service: Gynecology;  Laterality: N/A;   Social History:  reports that she has been smoking cigarettes. She started smoking about 38 years ago. She has been smoking an average of 1.5 packs per day. She has never used smokeless tobacco. She reports that she does not currently use alcohol. She reports that she does not use drugs.  Allergies  Allergen Reactions   Codeine Nausea And Vomiting    Family History  Problem Relation Age of Onset   Non-Hodgkin's lymphoma Mother 1   Colon cancer Neg Hx    Breast cancer Neg Hx    Ovarian cancer Neg Hx    Endometrial cancer Neg Hx    Pancreatic cancer Neg Hx    Prostate cancer Neg Hx     Prior to Admission medications   Medication Sig Start Date End Date Taking? Authorizing Provider  acetaminophen (TYLENOL) 500 MG tablet Take 1,000 mg by mouth every 6 (six) hours as needed.   Yes [provider]  clotrimazole-betamethasone (LOTRISONE) cream  Apply 1 Application topically daily. To feet. 02/23/23  Yes [provider]  dextromethorphan-guaiFENesin (MUCINEX DM) 30-600 MG 12hr tablet Take 1 tablet by mouth 2 (two) times daily as needed for cough.   Yes [provider]  diazepam (VALIUM) 10 MG tablet Take 10 mg by mouth daily as needed for anxiety. 02/23/23  Yes [provider]  dicyclomine (BENTYL) 20 MG tablet Take 20 mg by mouth daily.   Yes [provider]  HYDROcodone-acetaminophen (NORCO/VICODIN) 5-325 MG tablet Take 1 tablet by mouth daily as needed for moderate pain or severe pain. 11/27/22  Yes [provider]  predniSONE (DELTASONE) 20 MG tablet Take 10-40 mg by mouth daily. Take 40 mg (2 tablets) by mouth daily for 4 days, then 20 mg (1 tablet) for 4 days, then 10 mg (1/2 tablet) for 4 days. 02/07/23  Yes [provider]  triamcinolone ointment (KENALOG) 0.1 % Apply 1 Application topically daily as needed (Ear rash). 08/01/22 08/01/23 Yes [provider]  VENTOLIN HFA 108 (90 Base) MCG/ACT inhaler Inhale 2 puffs into the lungs every 4 (four) hours as needed for wheezing or shortness of breath. 01/09/23  Yes [provider]  cefPROZIL (CEFZIL) 250 MG tablet Take 250 mg by mouth 2 (two) times daily. Patient not taking: Reported on 02/23/2023 02/07/23   [provider]    Physical Exam: Vitals:   02/23/23 1645 02/23/23 1700 02/23/23 1715 02/23/23 1730  BP: 99/76 106/87 103/80 111/84  Pulse: (!) 130 (!) 140 (!) 134 (!) 136  Resp: (!) 21 (!) 22 (!) 27 (!) 23  Temp:      TempSrc:      SpO2: 93% 91% 95% 97%  Weight:      Height:       General: Middle age female. Awake and alert and oriented x3. No acute cardiopulmonary distress.  HEENT: Normocephalic atraumatic.  Right and left ears normal in appearance.  Pupils equal, round, reactive to light. Extraocular muscles are intact. Sclerae anicteric and noninjected.  Moist mucosal membranes. No mucosal lesions.  Neck: Large, firm, nonpulsatile mass in the right lower anterior lymphatic chain - this extends to the thoracic inlet. No carotid bruits. No masses palpated.  Cardiovascular: Irregular tachycardic rate. No murmurs, rubs, gallops auscultated. No JVD.  Respiratory: diffuse inspiratory and expiratory wheezing throughout. No accessory muscle use. Abdomen: Soft, nontender, nondistended. Active bowel sounds. No masses or hepatosplenomegaly  Skin: No rashes, lesions, or ulcerations.  Dry, warm to touch. 2+ dorsalis pedis and radial pulses. Musculoskeletal: No calf or leg pain. All major joints not  erythematous nontender.  No upper or lower joint deformation.  Good ROM.  No contractures  Psychiatric: Intact judgment and insight. Pleasant and cooperative. Neurologic: No focal neurological deficits. Strength is 5/5 and symmetric in upper and lower extremities.  Cranial nerves II through XII are grossly intact.  Data Reviewed: Results for orders placed or performed during the hospital encounter of 02/23/23 (from the past 24 hour(s))  Comprehensive metabolic panel     Status: Abnormal   Collection Time: 02/23/23 11:28 AM  Result Value Ref Range   Sodium 133 (L) 135 - 145 mmol/L   Potassium 3.2 (L) 3.5 - 5.1 mmol/L   Chloride 98 98 - 111 mmol/L   CO2 20 (L) 22 - 32 mmol/L   Glucose, Bld 130 (H) 70 - 99 mg/dL   BUN 18 6 - 20 mg/dL   Creatinine, Ser 8.46 (H) 0.44 - 1.00 mg/dL   Calcium  10.1 8.9 - 10.3 mg/dL   Total Protein 7.3 6.5 - 8.1 g/dL   Albumin 4.0 3.5 - 5.0 g/dL   AST 58 (H) 15 - 41 U/L   ALT 27 0 - 44 U/L   Alkaline Phosphatase 203 (H) 38 - 126 U/L   Total Bilirubin 0.9 0.3 - 1.2 mg/dL   GFR, Estimated 56 (L) >60 mL/min   Anion gap 15 5 - 15  Lactic acid, plasma     Status: Abnormal   Collection Time: 02/23/23 11:28 AM  Result Value Ref Range   Lactic Acid, Venous 3.0 (HH) 0.5 - 1.9 mmol/L  CBC with Differential     Status: Abnormal   Collection Time: 02/23/23 11:28 AM  Result Value Ref Range   WBC 8.0 4.0 - 10.5 K/uL   RBC 4.20 3.87 - 5.11 MIL/uL   Hemoglobin 13.8 12.0 - 15.0 g/dL   HCT 81.1 91.4 - 78.2 %   MCV 95.0 80.0 - 100.0 fL   MCH 32.9 26.0 - 34.0 pg   MCHC 34.6 30.0 - 36.0 g/dL   RDW 95.6 21.3 - 08.6 %   Platelets 50 (L) 150 - 400 K/uL   nRBC 0.4 (H) 0.0 - 0.2 %   Neutrophils Relative % 66 %   Neutro Abs 6.0 1.7 - 7.7 K/uL   Band Neutrophils 9 %   Lymphocytes Relative 20 %   Lymphs Abs 1.6 0.7 - 4.0 K/uL   Monocytes Relative 3 %   Monocytes Absolute 0.2 0.1 - 1.0 K/uL   Eosinophils Relative 1 %   Eosinophils Absolute 0.1 0.0 - 0.5 K/uL   Basophils  Relative 1 %   Basophils Absolute 0.1 0.0 - 0.1 K/uL   WBC Morphology MORPHOLOGY UNREMARKABLE    Smear Review PLATELET COUNT CONFIRMED BY SMEAR    Abs Immature Granulocytes 0.00 0.00 - 0.07 K/uL   Polychromasia PRESENT   Protime-INR     Status: None   Collection Time: 02/23/23 11:28 AM  Result Value Ref Range   Prothrombin Time 14.0 11.4 - 15.2 seconds   INR 1.1 0.8 - 1.2  Urinalysis, w/ Reflex to Culture (Infection Suspected) -Urine, Clean Catch     Status: Abnormal   Collection Time: 02/23/23  1:05 PM  Result Value Ref Range   Specimen Source URINE, CATHETERIZED    Color, Urine YELLOW YELLOW   APPearance HAZY (A) CLEAR   Specific Gravity, Urine >1.046 (H) 1.005 - 1.030   pH 5.0 5.0 - 8.0   Glucose, UA NEGATIVE NEGATIVE mg/dL   Hgb urine dipstick SMALL (A) NEGATIVE   Bilirubin Urine NEGATIVE NEGATIVE   Ketones, ur NEGATIVE NEGATIVE mg/dL   Protein, ur 30 (A) NEGATIVE mg/dL   Nitrite NEGATIVE NEGATIVE   Leukocytes,Ua NEGATIVE NEGATIVE  Lactic acid, plasma     Status: Abnormal   Collection Time: 02/23/23  1:51 PM  Result Value Ref Range   Lactic Acid, Venous 2.4 (HH) 0.5 - 1.9 mmol/L  Uric acid     Status: Abnormal   Collection Time: 02/23/23  2:01 PM  Result Value Ref Range   Uric Acid, Serum 10.5 (H) 2.5 - 7.1 mg/dL  Lactate dehydrogenase     Status: Abnormal   Collection Time: 02/23/23  2:01 PM  Result Value Ref Range   LDH 571 (H) 98 - 192 U/L    CT Angio Chest PE W and/or Wo Contrast  Result Date: 02/23/2023 CLINICAL DATA:  Shortness of breath, right neck mass, chest mass, headache *  Tracking Code: BO * EXAM: CT ANGIOGRAPHY CHEST WITH CONTRAST TECHNIQUE: Multidetector CT imaging of the chest was performed using the standard protocol during bolus administration of intravenous contrast. Multiplanar CT image reconstructions and MIPs were obtained to evaluate the vascular anatomy. RADIATION DOSE REDUCTION: This exam was performed according to the departmental  dose-optimization program which includes automated exposure control, adjustment of the mA and/or kV according to patient size and/or use of iterative reconstruction technique. CONTRAST:  75mL OMNIPAQUE IOHEXOL 350 MG/ML SOLN COMPARISON:  None Available. FINDINGS: Cardiovascular: No filling defect is identified in the pulmonary arterial tree to suggest pulmonary embolus. Mild atheromatous vascular calcification of the aortic arch. The large mediastinal mass bulges into the SVC posteriorly for example on image 128 series 4, probably from extrinsic compression, definite invasion is not observed. Because of lack of complete opacification from pulmonary arterial timing, patency of the left brachiocephalic vein is uncertain. Moderate pericardial effusion. Mediastinum/Nodes: Large right infrahilar and hilar mass extending into the mediastinum and tracking all the way to the thoracic inlet. This extends around the right mainstem bronchus, right upper lobe bronchus, and bronchus intermedius as well as the right lower lobe and right middle lobe bronchi, and likewise surrounds and narrows the right pulmonary artery and its branches. In the subcarinal region, the conglomerate mass measures about 7.9 by 5.6 cm (image 144, series 4) and the mass flattens and narrows the SVC without overt occlusion. In the right infrahilar region, the observed mass measures 4.7 by 3.9 cm on image 179 series 4. Presumed lymph node anterior to the SVC in the upper chest measures 2.6 cm in short axis on image 36 series 3. A right lower neck level IV mass/lymph node measures 3.4 cm in short axis on image 10 series 3. Lungs/Pleura: The mass in the right chest displaces the upper trachea to the left. There is substantial narrowing of the right upper lobe bronchus, right middle lobe bronchus, and severe narrowing of the right lower lobe bronchus due to the surrounding mass. Patchy regions of nodularity in the right lower lobe distal to the mass may  represent postobstructive pneumonitis or satellite tumor nodules. Centrilobular emphysema. Scarring or atelectasis in the right middle lobe and right upper lobe anteriorly. Mild dependent atelectasis in both lower lobes. Trace right pleural effusion, image 50 series 3. Upper Abdomen: Small nonspecific hypodense lesion in the left hepatic lobe 0.9 cm in long axis on image 96 series 3. Musculoskeletal: Unremarkable Review of the MIP images confirms the above findings. IMPRESSION: 1. Large right infrahilar and hilar mass extending into the mediastinum and tracking all the way to the thoracic inlet. This extends around the right mainstem bronchus, right upper lobe bronchus, and bronchus intermedius as well as the right lower lobe and right middle lobe bronchi, and likewise surrounds and narrows the right pulmonary artery and its branches. The mass flattens and narrows the SVC without overt occlusion, and is confluent with bulky right level IV neck adenopathy. Top differential diagnostic considerations include lung cancer (such as squamous cell or small cell) versus extensive thoracic and right lower neck lymphoma. 2. Patchy regions of nodularity in the right lower lobe distal to the mass may represent postobstructive pneumonitis or satellite tumor nodules. 3. Presumed lymph node anterior to the SVC in the upper chest measures 2.6 cm in short axis. 4. A right lower neck level IV mass/lymph node measures 3.4 cm in short axis, compatible with malignancy. 5. Moderate pericardial effusion. 6. Trace right pleural effusion. 7. Centrilobular  emphysema. 8. Small nonspecific hypodense lesion in the left hepatic lobe 0.9 cm in long axis. 9. No pulmonary embolus is identified. 10. Aortic atherosclerosis. Aortic Atherosclerosis (ICD10-I70.0) and Emphysema (ICD10-J43.9). Electronically Signed   By: Gaylyn Rong M.D.   On: 02/23/2023 13:39   CT Soft Tissue Neck W Contrast  Result Date: 02/23/2023 CLINICAL DATA:  Neck mass,  nonpulsatile right sided neck mass EXAM: CT NECK WITH CONTRAST TECHNIQUE: Multidetector CT imaging of the neck was performed using the standard protocol following the bolus administration of intravenous contrast. RADIATION DOSE REDUCTION: This exam was performed according to the departmental dose-optimization program which includes automated exposure control, adjustment of the mA and/or kV according to patient size and/or use of iterative reconstruction technique. CONTRAST:  75mL OMNIPAQUE IOHEXOL 350 MG/ML SOLN COMPARISON:  None Available. FINDINGS: Pharynx and larynx: Normal. No mass or swelling. Salivary glands: No inflammation, mass, or stone. Thyroid: Normal. Lymph nodes: There is bulky lower cervical and mediastinal lymphadenopathy involving the right level 3 and level 4 lymph node stations, the right supraclavicular region, and the anterior and posterior mediastinum. Cervical lymphadenopathy significant mass effect on the right internal jugular vein (series 5, image 68). Vascular: Negative. Mass effect on the right internal jugular vein, as above Limited intracranial: Negative. Visualized orbits: Negative. Mastoids and visualized paranasal sinuses: Moderate left-sided mastoid effusion. There is pneumatization of the petrous apices with air-fluid levels. Paranasal sinuses are clear. Orbits are unremarkable. Skeleton: No acute or aggressive process. Upper chest: Mild paraseptal emphysema. See same day CT chest for additional findings Other: None. IMPRESSION: 1. Bulky right lower cervical and mediastinal lymphadenopathy, concerning for metastatic disease or lymphoma. 2. See separate CT chest for additional findings. Emphysema (ICD10-J43.9). Electronically Signed   By: Lorenza Cambridge M.D.   On: 02/23/2023 13:17   CT Head Wo Contrast  Result Date: 02/23/2023 CLINICAL DATA:  Headache EXAM: CT HEAD WITHOUT CONTRAST TECHNIQUE: Contiguous axial images were obtained from the base of the skull through the vertex  without intravenous contrast. RADIATION DOSE REDUCTION: This exam was performed according to the departmental dose-optimization program which includes automated exposure control, adjustment of the mA and/or kV according to patient size and/or use of iterative reconstruction technique. COMPARISON:  None Available. FINDINGS: Brain: There is no acute intracranial hemorrhage, extra-axial fluid collection, or acute infarct Parenchymal volume is normal. The ventricles are normal in size. Gray-white differentiation is preserved The pituitary and suprasellar region are normal. There is no mass lesion. There is no mass effect or midline shift. Vascular: No hyperdense vessel or unexpected calcification. Skull: Normal. Negative for fracture or focal lesion. Sinuses/Orbits: The imaged paranasal sinuses are clear. The globes and orbits are unremarkable. Other: There are small bilateral mastoid effusions. IMPRESSION: 1. No acute intracranial pathology. 2. Small bilateral mastoid effusions. Electronically Signed   By: Lesia Hausen M.D.   On: 02/23/2023 13:15   DG Chest Port 1 View  Result Date: 02/23/2023 CLINICAL DATA:  Sepsis EXAM: PORTABLE CHEST 1 VIEW COMPARISON:  X-ray 01/09/2023 FINDINGS: No pneumothorax, effusion or edema. Calcified aorta. Overlapping cardiac leads. There is new widening of the mediastinum compared to prior x-ray and fullness of the right lung hilum. Recommend further evaluation with CT to delineate for potential mass. IMPRESSION: New widening of the mediastinum with masslike fullness in the right lung hilum. Recommend follow up contrast CT to further delineate Electronically Signed   By: Karen Kays M.D.   On: 02/23/2023 13:11     Assessment and Plan: No notes have  been filed under this hospital service. Service: Hospitalist  Principal Problem:   Lung mass Active Problems:   Atrial fibrillation with RVR (HCC)   Hypokalemia   Acute respiratory failure with hypoxia (HCC)   Thrombocytopenia  (HCC)   COPD with acute exacerbation (HCC)   Lactic acidosis  Acute respiratory failure with hypoxia Multifactorial as the patient has A-fib with RVR, COPD exacerbation and the right lung mass Admit to [facility] progressive care A fib with RVR On cardizem drip.  May need to add digoxin or amiodarone Consulted Dr Sharyn Lull with Cardiology. He recommended starting amiodarone load and drip with lopressor 25mg  BID and wean off the cardizem drip. COPD with acute exacerbation Start solumedrol Lung mass Discussed patient with Dr Laneta Simmers with cardiothoracic surgery - Due to the size of the mass, this would be non-operable and that the best way to get tissue biopsy would either be bronchoscopy with pulmonology or percutaneously with IR.  I talked with Dr Anders Simmonds - one of the pulmonologists will consult in the morning. Hypokalemia Potassium given Lactic acidosis with metabolic acidosis Repeat BMP Repeat lactic acid Likely secondary to hypoxia Thrombocytopenia SCD for DVE prophylaxis.  May need platelet transfusion  No spontaneous bleeding noted   Advance Care Planning:   Code Status: Prior Full code  Consults: Cardiology, CT surgery  Family Communication: partner present  Severity of Illness: The appropriate patient status for this patient is INPATIENT. Inpatient status is judged to be reasonable and necessary in order to provide the required intensity of service to ensure the patient's safety. The patient's presenting symptoms, physical exam findings, and initial radiographic and laboratory data in the context of their chronic comorbidities is felt to place them at high risk for further clinical deterioration. Furthermore, it is not anticipated that the patient will be medically stable for discharge from the hospital within 2 midnights of admission.   * I certify that at the point of admission it is my clinical judgment that the patient will require inpatient hospital care spanning  beyond 2 midnights from the point of admission due to high intensity of service, high risk for further deterioration and high frequency of surveillance required.*  Author: Levie Heritage, DO 02/23/2023 5:39 PM  For on call review [email or web address].
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
## 2023-02-23 NOTE — ED Notes (Signed)
Patient transported to CT 

## 2023-02-23 NOTE — ED Notes (Signed)
This RN and another RN attempted another IV access site, was both unsuccessful, will ask RN certified in ultrasound IV to attempt access

## 2023-02-23 NOTE — ED Notes (Signed)
PA at bedside attempting IV access via ultrasound

## 2023-02-24 ENCOUNTER — Inpatient Hospital Stay (HOSPITAL_COMMUNITY): Payer: Medicaid Other

## 2023-02-24 DIAGNOSIS — I4891 Unspecified atrial fibrillation: Secondary | ICD-10-CM | POA: Diagnosis not present

## 2023-02-24 DIAGNOSIS — R918 Other nonspecific abnormal finding of lung field: Secondary | ICD-10-CM

## 2023-02-24 LAB — ECHOCARDIOGRAM COMPLETE
AR max vel: 2.42 cm2
AV Area VTI: 2.82 cm2
AV Area mean vel: 2.26 cm2
AV Mean grad: 5 mmHg
AV Peak grad: 7.6 mmHg
Ao pk vel: 1.38 m/s
Area-P 1/2: 5.23 cm2
Height: 68 in
S' Lateral: 3 cm
Weight: 2512 oz

## 2023-02-24 LAB — CBC
HCT: 34.2 % — ABNORMAL LOW (ref 36.0–46.0)
Hemoglobin: 11.8 g/dL — ABNORMAL LOW (ref 12.0–15.0)
MCH: 32.9 pg (ref 26.0–34.0)
MCHC: 34.5 g/dL (ref 30.0–36.0)
MCV: 95.3 fL (ref 80.0–100.0)
Platelets: 44 10*3/uL — ABNORMAL LOW (ref 150–400)
RBC: 3.59 MIL/uL — ABNORMAL LOW (ref 3.87–5.11)
RDW: 14.1 % (ref 11.5–15.5)
WBC: 8.9 10*3/uL (ref 4.0–10.5)
nRBC: 0.2 % (ref 0.0–0.2)

## 2023-02-24 LAB — COMPREHENSIVE METABOLIC PANEL
ALT: 27 U/L (ref 0–44)
AST: 52 U/L — ABNORMAL HIGH (ref 15–41)
Albumin: 3.5 g/dL (ref 3.5–5.0)
Alkaline Phosphatase: 177 U/L — ABNORMAL HIGH (ref 38–126)
Anion gap: 13 (ref 5–15)
BUN: 14 mg/dL (ref 6–20)
CO2: 19 mmol/L — ABNORMAL LOW (ref 22–32)
Calcium: 9.6 mg/dL (ref 8.9–10.3)
Chloride: 102 mmol/L (ref 98–111)
Creatinine, Ser: 1.16 mg/dL — ABNORMAL HIGH (ref 0.44–1.00)
GFR, Estimated: 57 mL/min — ABNORMAL LOW (ref >60)
Glucose, Bld: 165 mg/dL — ABNORMAL HIGH (ref 70–99)
Potassium: 4.1 mmol/L (ref 3.5–5.1)
Sodium: 134 mmol/L — ABNORMAL LOW (ref 135–145)
Total Bilirubin: 0.6 mg/dL (ref 0.3–1.2)
Total Protein: 6.3 g/dL — ABNORMAL LOW (ref 6.5–8.1)

## 2023-02-24 LAB — DIC (DISSEMINATED INTRAVASCULAR COAGULATION)PANEL
D-Dimer, Quant: 7.34 ug/mL-FEU — ABNORMAL HIGH (ref 0.00–0.50)
Fibrinogen: 321 mg/dL (ref 210–475)
INR: 1.1 (ref 0.8–1.2)
Platelets: 46 10*3/uL — ABNORMAL LOW (ref 150–400)
Prothrombin Time: 14.6 seconds (ref 11.4–15.2)
Smear Review: NONE SEEN
aPTT: 24 seconds (ref 24–36)

## 2023-02-24 LAB — LACTATE DEHYDROGENASE: LDH: 530 U/L — ABNORMAL HIGH (ref 98–192)

## 2023-02-24 LAB — CULTURE, BLOOD (ROUTINE X 2): Culture: NO GROWTH

## 2023-02-24 LAB — TSH: TSH: 1.765 u[IU]/mL (ref 0.350–4.500)

## 2023-02-24 LAB — URIC ACID: Uric Acid, Serum: 8.9 mg/dL — ABNORMAL HIGH (ref 2.5–7.1)

## 2023-02-24 LAB — IMMATURE PLATELET FRACTION: Immature Platelet Fraction: 8.6 % (ref 1.2–8.6)

## 2023-02-24 LAB — HIV ANTIBODY (ROUTINE TESTING W REFLEX): HIV Screen 4th Generation wRfx: NONREACTIVE

## 2023-02-24 LAB — LACTIC ACID, PLASMA: Lactic Acid, Venous: 3.2 mmol/L (ref 0.5–1.9)

## 2023-02-24 MED ORDER — SODIUM CHLORIDE 0.9 % IV SOLN: INTRAVENOUS | Status: DC

## 2023-02-24 MED ORDER — ALLOPURINOL 300 MG PO TABS
300.0000 mg | ORAL_TABLET | Freq: Two times a day (BID) | ORAL | Status: DC
  Administered 2023-02-24 – 2023-03-05 (×18): 300 mg via ORAL
  Filled 2023-02-24 (×18): qty 1

## 2023-02-24 MED ORDER — ALBUTEROL SULFATE (2.5 MG/3ML) 0.083% IN NEBU
2.5000 mg | INHALATION_SOLUTION | RESPIRATORY_TRACT | Status: DC | PRN
  Administered 2023-02-24 – 2023-02-26 (×3): 2.5 mg via RESPIRATORY_TRACT
  Filled 2023-02-24 (×4): qty 3

## 2023-02-24 MED ORDER — AMIODARONE HCL 200 MG PO TABS
200.0000 mg | ORAL_TABLET | Freq: Every day | ORAL | Status: DC
  Administered 2023-02-24 – 2023-03-05 (×10): 200 mg via ORAL
  Filled 2023-02-24 (×10): qty 1

## 2023-02-24 MED ORDER — AMIODARONE HCL IN DEXTROSE 360-4.14 MG/200ML-% IV SOLN
30.0000 mg/h | INTRAVENOUS | Status: DC
  Filled 2023-02-24: qty 200
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
## 2023-02-24 NOTE — Progress Notes (Signed)
Patient admitted from [facility] via [facility],alert and oriented,attached to cardiac monitoring,CCMD notified. V/S checked,oriented to the room,admitting provider notified.

## 2023-02-24 NOTE — Progress Notes (Signed)
Subjective:  Patient denies any chest pain or shortness of breath denies any palpitations remains in sinus rhythm.  Remains on IV amiodarone tolerating it well no obvious bleeding noted.  Objective:  Vital Signs in the last 24 hours: Temp:  [97.6 F (36.4 C)-98.2 F (36.8 C)] 98.2 F (36.8 C) (06/15 0915) Pulse Rate:  [86-159] 92 (06/15 0915) Resp:  [11-31] 20 (06/15 0915) BP: (87-122)/(57-94) 111/73 (06/15 0915) SpO2:  [90 %-97 %] 94 % (06/15 0915) Weight:  [71.2 kg] 71.2 kg (06/14 1108)  Intake/Output from previous day: 06/14 0701 - 06/15 0700 In: 14.6 [I.V.:14.6] Out: -  Intake/Output from this shift: No intake/output data recorded.  Physical Exam: Neck: no carotid bruit, no JVD, and supple, symmetrical, trachea midline Lungs: Decreased breath sounds left bases wheezing markedly improved Heart: regular rate and rhythm, S1, S2 normal, and soft systolic murmur noted no S3 gallop Abdomen: soft, non-tender; bowel sounds normal; no masses,  no organomegaly Extremities: extremities normal, atraumatic, no cyanosis or edema  Lab Results: Recent Labs    02/23/23 1128 02/24/23 0109  WBC 8.0 8.9  HGB 13.8 11.8*  PLT 50* 44*   Recent Labs    02/23/23 1859 02/24/23 0109  NA 135 134*  K 3.4* 4.1  CL 103 102  CO2 20* 19*  GLUCOSE 150* 165*  BUN 15 14  CREATININE 1.16* 1.16*   No results for input(s): "TROPONINI" in the last 72 hours.  Invalid input(s): "CK", "MB" Hepatic Function Panel Recent Labs    02/24/23 0109  PROT 6.3*  ALBUMIN 3.5  AST 52*  ALT 27  ALKPHOS 177*  BILITOT 0.6   No results for input(s): "CHOL" in the last 72 hours. No results for input(s): "PROTIME" in the last 72 hours.  Imaging: Imaging results have been reviewed and CT Angio Chest PE W and/or Wo Contrast  Result Date: 02/23/2023 CLINICAL DATA:  Shortness of breath, right neck mass, chest mass, headache * Tracking Code: BO * EXAM: CT ANGIOGRAPHY CHEST WITH CONTRAST TECHNIQUE:  Multidetector CT imaging of the chest was performed using the standard protocol during bolus administration of intravenous contrast. Multiplanar CT image reconstructions and MIPs were obtained to evaluate the vascular anatomy. RADIATION DOSE REDUCTION: This exam was performed according to the departmental dose-optimization program which includes automated exposure control, adjustment of the mA and/or kV according to patient size and/or use of iterative reconstruction technique. CONTRAST:  75mL OMNIPAQUE IOHEXOL 350 MG/ML SOLN COMPARISON:  None Available. FINDINGS: Cardiovascular: No filling defect is identified in the pulmonary arterial tree to suggest pulmonary embolus. Mild atheromatous vascular calcification of the aortic arch. The large mediastinal mass bulges into the SVC posteriorly for example on image 128 series 4, probably from extrinsic compression, definite invasion is not observed. Because of lack of complete opacification from pulmonary arterial timing, patency of the left brachiocephalic vein is uncertain. Moderate pericardial effusion. Mediastinum/Nodes: Large right infrahilar and hilar mass extending into the mediastinum and tracking all the way to the thoracic inlet. This extends around the right mainstem bronchus, right upper lobe bronchus, and bronchus intermedius as well as the right lower lobe and right middle lobe bronchi, and likewise surrounds and narrows the right pulmonary artery and its branches. In the subcarinal region, the conglomerate mass measures about 7.9 by 5.6 cm (image 144, series 4) and the mass flattens and narrows the SVC without overt occlusion. In the right infrahilar region, the observed mass measures 4.7 by 3.9 cm on image 179 series 4. Presumed lymph  node anterior to the SVC in the upper chest measures 2.6 cm in short axis on image 36 series 3. A right lower neck level IV mass/lymph node measures 3.4 cm in short axis on image 10 series 3. Lungs/Pleura: The mass in the  right chest displaces the upper trachea to the left. There is substantial narrowing of the right upper lobe bronchus, right middle lobe bronchus, and severe narrowing of the right lower lobe bronchus due to the surrounding mass. Patchy regions of nodularity in the right lower lobe distal to the mass may represent postobstructive pneumonitis or satellite tumor nodules. Centrilobular emphysema. Scarring or atelectasis in the right middle lobe and right upper lobe anteriorly. Mild dependent atelectasis in both lower lobes. Trace right pleural effusion, image 50 series 3. Upper Abdomen: Small nonspecific hypodense lesion in the left hepatic lobe 0.9 cm in long axis on image 96 series 3. Musculoskeletal: Unremarkable Review of the MIP images confirms the above findings. IMPRESSION: 1. Large right infrahilar and hilar mass extending into the mediastinum and tracking all the way to the thoracic inlet. This extends around the right mainstem bronchus, right upper lobe bronchus, and bronchus intermedius as well as the right lower lobe and right middle lobe bronchi, and likewise surrounds and narrows the right pulmonary artery and its branches. The mass flattens and narrows the SVC without overt occlusion, and is confluent with bulky right level IV neck adenopathy. Top differential diagnostic considerations include lung cancer (such as squamous cell or small cell) versus extensive thoracic and right lower neck lymphoma. 2. Patchy regions of nodularity in the right lower lobe distal to the mass may represent postobstructive pneumonitis or satellite tumor nodules. 3. Presumed lymph node anterior to the SVC in the upper chest measures 2.6 cm in short axis. 4. A right lower neck level IV mass/lymph node measures 3.4 cm in short axis, compatible with malignancy. 5. Moderate pericardial effusion. 6. Trace right pleural effusion. 7. Centrilobular emphysema. 8. Small nonspecific hypodense lesion in the left hepatic lobe 0.9 cm in long  axis. 9. No pulmonary embolus is identified. 10. Aortic atherosclerosis. Aortic Atherosclerosis (ICD10-I70.0) and Emphysema (ICD10-J43.9). Electronically Signed   By: Gaylyn Rong M.D.   On: 02/23/2023 13:39   CT Soft Tissue Neck W Contrast  Result Date: 02/23/2023 CLINICAL DATA:  Neck mass, nonpulsatile right sided neck mass EXAM: CT NECK WITH CONTRAST TECHNIQUE: Multidetector CT imaging of the neck was performed using the standard protocol following the bolus administration of intravenous contrast. RADIATION DOSE REDUCTION: This exam was performed according to the departmental dose-optimization program which includes automated exposure control, adjustment of the mA and/or kV according to patient size and/or use of iterative reconstruction technique. CONTRAST:  75mL OMNIPAQUE IOHEXOL 350 MG/ML SOLN COMPARISON:  None Available. FINDINGS: Pharynx and larynx: Normal. No mass or swelling. Salivary glands: No inflammation, mass, or stone. Thyroid: Normal. Lymph nodes: There is bulky lower cervical and mediastinal lymphadenopathy involving the right level 3 and level 4 lymph node stations, the right supraclavicular region, and the anterior and posterior mediastinum. Cervical lymphadenopathy significant mass effect on the right internal jugular vein (series 5, image 68). Vascular: Negative. Mass effect on the right internal jugular vein, as above Limited intracranial: Negative. Visualized orbits: Negative. Mastoids and visualized paranasal sinuses: Moderate left-sided mastoid effusion. There is pneumatization of the petrous apices with air-fluid levels. Paranasal sinuses are clear. Orbits are unremarkable. Skeleton: No acute or aggressive process. Upper chest: Mild paraseptal emphysema. See same day CT chest for additional  findings Other: None. IMPRESSION: 1. Bulky right lower cervical and mediastinal lymphadenopathy, concerning for metastatic disease or lymphoma. 2. See separate CT chest for additional  findings. Emphysema (ICD10-J43.9). Electronically Signed   By: Lorenza Cambridge M.D.   On: 02/23/2023 13:17   CT Head Wo Contrast  Result Date: 02/23/2023 CLINICAL DATA:  Headache EXAM: CT HEAD WITHOUT CONTRAST TECHNIQUE: Contiguous axial images were obtained from the base of the skull through the vertex without intravenous contrast. RADIATION DOSE REDUCTION: This exam was performed according to the departmental dose-optimization program which includes automated exposure control, adjustment of the mA and/or kV according to patient size and/or use of iterative reconstruction technique. COMPARISON:  None Available. FINDINGS: Brain: There is no acute intracranial hemorrhage, extra-axial fluid collection, or acute infarct Parenchymal volume is normal. The ventricles are normal in size. Gray-white differentiation is preserved The pituitary and suprasellar region are normal. There is no mass lesion. There is no mass effect or midline shift. Vascular: No hyperdense vessel or unexpected calcification. Skull: Normal. Negative for fracture or focal lesion. Sinuses/Orbits: The imaged paranasal sinuses are clear. The globes and orbits are unremarkable. Other: There are small bilateral mastoid effusions. IMPRESSION: 1. No acute intracranial pathology. 2. Small bilateral mastoid effusions. Electronically Signed   By: Lesia Hausen M.D.   On: 02/23/2023 13:15   DG Chest Port 1 View  Result Date: 02/23/2023 CLINICAL DATA:  Sepsis EXAM: PORTABLE CHEST 1 VIEW COMPARISON:  X-ray 01/09/2023 FINDINGS: No pneumothorax, effusion or edema. Calcified aorta. Overlapping cardiac leads. There is new widening of the mediastinum compared to prior x-ray and fullness of the right lung hilum. Recommend further evaluation with CT to delineate for potential mass. IMPRESSION: New widening of the mediastinum with masslike fullness in the right lung hilum. Recommend follow up contrast CT to further delineate Electronically Signed   By: Karen Kays  M.D.   On: 02/23/2023 13:11    Cardiac Studies:  Assessment/Plan:  Status post paroxysmal A-fib with RVR Right lung mass workup in progress Exacerbation of COPD Thrombocytopenia Plan Switch IV amiodarone to p.o. once present bag is finished Check 2D echo   LOS: 1 day    Rinaldo Cloud 02/24/2023, 10:23 AM

## 2023-02-24 NOTE — Progress Notes (Signed)
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  Patient was seen by her primary care physician couple of times and was given antibiotics and was also diagnosed with pneumonia but she had no wheezing, coughing.  Recently noted to have right neck mass.  On presentation,she was hypoxic and requiring supplemental oxygen.  CT chest showed right infrahilar/hilar mass that extends around the right mainstem bronchus, the right upper lobe and all the way to the thoracic inlet with bulky adenopathy.  EKG showed A-fib with RVR.  Given iv  Cardizem.  Lab work showed potassium 3.2, alkaline phosphatase of 203, lactate of 3,  Platelets of 50.  Cardiology, PCCM,oncology consulted.  Plan for IR guided cervical node biopsy.  Assessment & Plan:  Principal Problem:   Lung mass Active Problems:   Atrial fibrillation with RVR (HCC)   Hypokalemia   Acute respiratory failure with hypoxia (HCC)   Thrombocytopenia (HCC)   COPD with acute exacerbation (HCC)   Lactic acidosis   Acute respiratory failure with hypoxia: Likely secondary to right lung mass/concomitant COPD.  On supplemental 2 L oxygen.  Not on oxygen at home.  Will continue to wean.  Right lung mass: Presented with shortness of breath.  Imaging showed  right infrahilar/hilar mass that extends around the right mainstem bronchus, the right upper lobe and all the way to the thoracic inlet with bulky adenopathy, likely lung cancer but could be lymphoma too.  Family history of lymphoma.  Case was also discussed with cardiothoracic surgery, not a candidate for surgery.  Plan for tissue biopsy.  PCCM/oncology consulted.  Plan for cervical lymph node biopsy.  Also has elevated LDH, uric acid  level  Suspected COPD: No history of COPD in the past.  Presented with wheezing, shortness of breath.  Patient is a smoker.  Started on bronchodilators  A-fib with RVR: New onset.  Initially started on Cardizem drip, amiodarone drip.  CHA2DS2-VASc score low,doesnot   not need anticoagulation.  Patient also has severe thrombocytopenia.  TSH normal.  2D echo ordered. She converted to  normal sinus rhythm today.  Amiodarone changed to oral.  Lactic acidosis: Mild, gentle IV fluids  Thrombocytopenia: Mild bleeding.  Unclear etiology.  Needs further workup.  Oncology/hematology consulted.  This is most likely secondary to malignancy.  Hypokalemia: Resolved  Tobacco use: Counseled cessation.  Has history of 50+ pack year of smoking.         DVT prophylaxis:SCDs Start: 02/23/23 2132     Code Status: Full Code  Family Communication: None at the bedside  Patient status:Inpatient  Patient is from :home  Anticipated discharge WJ:XBJY  Estimated DC date:2-3 days, after full workup   Consultants: PCCM,cardiology  Procedures:none  Antimicrobials:  Anti-infectives (From admission, onward)    None       Subjective: Patient seen and examined at bedside today.  Hemodynamically stable.  On 2 L of oxygen.  Does not appear short of breath.  Denies chest pain.  Looked more comfortable.  Feels better than when she came.  Objective: Vitals:   02/23/23 2200 02/24/23 0022 02/24/23 0200 02/24/23 0320  BP:  90/64  114/69  Pulse:  87  86  Resp: 20 19 20 20   Temp:  98.1 F (36.7 C)  97.6 F (36.4 C)  TempSrc:  Oral  Oral  SpO2:  93%  93%  Weight:      Height:        Intake/Output Summary (Last 24 hours) at 02/24/2023 0826 Last data filed at 02/23/2023 1738 Gross per 24 hour  Intake 14.59 ml  Output --  Net 14.59 ml   Filed Weights   02/23/23 1108  Weight: 71.2 kg    Examination:  General exam: Overall comfortable, not in distress HEENT: PERRL Respiratory system:  no  wheezes or crackles , mild diminished sounds on the right side Cardiovascular system: S1 & S2 heard, RRR.  Gastrointestinal system: Abdomen is nondistended, soft and nontender. Central nervous system: Alert and oriented Extremities: No edema, no clubbing ,no cyanosis Skin: No rashes, no ulcers,no icterus     Data Reviewed: I have personally reviewed following labs and imaging studies  CBC: Recent Labs  Lab 02/23/23 1128 02/24/23 0109  WBC 8.0 8.9  NEUTROABS 6.0  --   HGB 13.8 11.8*  HCT 39.9 34.2*  MCV 95.0 95.3  PLT 50* 44*   Basic Metabolic Panel: Recent Labs  Lab 02/23/23 1128 02/23/23 1859 02/24/23 0109  NA 133* 135 134*  K 3.2* 3.4* 4.1  CL 98 103 102  CO2 20* 20* 19*  GLUCOSE 130* 150* 165*  BUN 18 15 14   CREATININE 1.18* 1.16* 1.16*  CALCIUM 10.1 9.5 9.6     Recent Results (from the past 240 hour(s))  Culture, blood (Routine x 2)     Status: None (Preliminary result)   Collection Time: 02/23/23 11:28 AM   Specimen: BLOOD  Result Value Ref Range Status   Specimen Description BLOOD BLOOD RIGHT ARM  Final   Special Requests   Final    BOTTLES DRAWN AEROBIC AND ANAEROBIC Blood Culture adequate volume   Culture   Final    NO GROWTH < 24 HOURS Performed at [facility], [address], [place], [place] [address]    Report Status PENDING  Incomplete  Culture, blood (Routine x 2)     Status: None (Preliminary result)   Collection Time: 02/23/23 11:29 AM   Specimen: BLOOD  Result Value Ref Range Status   Specimen Description BLOOD BLOOD LEFT ARM LAC  Final   Special Requests   Final    BOTTLES DRAWN AEROBIC AND ANAEROBIC Blood Culture adequate volume   Culture   Final    NO GROWTH < 24 HOURS Performed at [facility], [address] [place], [place] [address]    Report Status PENDING  Incomplete     Radiology Studies: CT Angio Chest PE W and/or Wo Contrast  Result Date: 02/23/2023 CLINICAL DATA:  Shortness of breath, right neck mass, chest mass,  headache * Tracking Code: BO * EXAM: CT ANGIOGRAPHY CHEST WITH CONTRAST TECHNIQUE: Multidetector CT imaging of the chest was performed using the standard protocol during bolus administration of intravenous contrast. Multiplanar CT image reconstructions and MIPs were obtained to evaluate the vascular anatomy. RADIATION DOSE REDUCTION: This exam was performed according to the departmental dose-optimization program which includes automated exposure control, adjustment of the mA and/or kV according to patient size and/or use of iterative reconstruction technique. CONTRAST:  75mL OMNIPAQUE IOHEXOL 350 MG/ML SOLN COMPARISON:  None Available. FINDINGS: Cardiovascular: No filling defect is identified in the pulmonary arterial tree to suggest pulmonary embolus. Mild atheromatous vascular calcification of the aortic arch. The large mediastinal mass bulges into the SVC posteriorly for example on image 128 series 4, probably from extrinsic compression,  definite invasion is not observed. Because of lack of complete opacification from pulmonary arterial timing, patency of the left brachiocephalic vein is uncertain. Moderate pericardial effusion. Mediastinum/Nodes: Large right infrahilar and hilar mass extending into the mediastinum and tracking all the way to the thoracic inlet. This extends around the right mainstem bronchus, right upper lobe bronchus, and bronchus intermedius as well as the right lower lobe and right middle lobe bronchi, and likewise surrounds and narrows the right pulmonary artery and its branches. In the subcarinal region, the conglomerate mass measures about 7.9 by 5.6 cm (image 144, series 4) and the mass flattens and narrows the SVC without overt occlusion. In the right infrahilar region, the observed mass measures 4.7 by 3.9 cm on image 179 series 4. Presumed lymph node anterior to the SVC in the upper chest measures 2.6 cm in short axis on image 36 series 3. A right lower neck level IV mass/lymph node  measures 3.4 cm in short axis on image 10 series 3. Lungs/Pleura: The mass in the right chest displaces the upper trachea to the left. There is substantial narrowing of the right upper lobe bronchus, right middle lobe bronchus, and severe narrowing of the right lower lobe bronchus due to the surrounding mass. Patchy regions of nodularity in the right lower lobe distal to the mass may represent postobstructive pneumonitis or satellite tumor nodules. Centrilobular emphysema. Scarring or atelectasis in the right middle lobe and right upper lobe anteriorly. Mild dependent atelectasis in both lower lobes. Trace right pleural effusion, image 50 series 3. Upper Abdomen: Small nonspecific hypodense lesion in the left hepatic lobe 0.9 cm in long axis on image 96 series 3. Musculoskeletal: Unremarkable Review of the MIP images confirms the above findings. IMPRESSION: 1. Large right infrahilar and hilar mass extending into the mediastinum and tracking all the way to the thoracic inlet. This extends around the right mainstem bronchus, right upper lobe bronchus, and bronchus intermedius as well as the right lower lobe and right middle lobe bronchi, and likewise surrounds and narrows the right pulmonary artery and its branches. The mass flattens and narrows the SVC without overt occlusion, and is confluent with bulky right level IV neck adenopathy. Top differential diagnostic considerations include lung cancer (such as squamous cell or small cell) versus extensive thoracic and right lower neck lymphoma. 2. Patchy regions of nodularity in the right lower lobe distal to the mass may represent postobstructive pneumonitis or satellite tumor nodules. 3. Presumed lymph node anterior to the SVC in the upper chest measures 2.6 cm in short axis. 4. A right lower neck level IV mass/lymph node measures 3.4 cm in short axis, compatible with malignancy. 5. Moderate pericardial effusion. 6. Trace right pleural effusion. 7. Centrilobular  emphysema. 8. Small nonspecific hypodense lesion in the left hepatic lobe 0.9 cm in long axis. 9. No pulmonary embolus is identified. 10. Aortic atherosclerosis. Aortic Atherosclerosis (ICD10-I70.0) and Emphysema (ICD10-J43.9). Electronically Signed   By: Gaylyn Rong M.D.   On: 02/23/2023 13:39   CT Soft Tissue Neck W Contrast  Result Date: 02/23/2023 CLINICAL DATA:  Neck mass, nonpulsatile right sided neck mass EXAM: CT NECK WITH CONTRAST TECHNIQUE: Multidetector CT imaging of the neck was performed using the standard protocol following the bolus administration of intravenous contrast. RADIATION DOSE REDUCTION: This exam was performed according to the departmental dose-optimization program which includes automated exposure control, adjustment of the mA and/or kV according to patient size and/or use of iterative reconstruction technique. CONTRAST:  75mL OMNIPAQUE IOHEXOL 350  MG/ML SOLN COMPARISON:  None Available. FINDINGS: Pharynx and larynx: Normal. No mass or swelling. Salivary glands: No inflammation, mass, or stone. Thyroid: Normal. Lymph nodes: There is bulky lower cervical and mediastinal lymphadenopathy involving the right level 3 and level 4 lymph node stations, the right supraclavicular region, and the anterior and posterior mediastinum. Cervical lymphadenopathy significant mass effect on the right internal jugular vein (series 5, image 68). Vascular: Negative. Mass effect on the right internal jugular vein, as above Limited intracranial: Negative. Visualized orbits: Negative. Mastoids and visualized paranasal sinuses: Moderate left-sided mastoid effusion. There is pneumatization of the petrous apices with air-fluid levels. Paranasal sinuses are clear. Orbits are unremarkable. Skeleton: No acute or aggressive process. Upper chest: Mild paraseptal emphysema. See same day CT chest for additional findings Other: None. IMPRESSION: 1. Bulky right lower cervical and mediastinal lymphadenopathy,  concerning for metastatic disease or lymphoma. 2. See separate CT chest for additional findings. Emphysema (ICD10-J43.9). Electronically Signed   By: Lorenza Cambridge M.D.   On: 02/23/2023 13:17   CT Head Wo Contrast  Result Date: 02/23/2023 CLINICAL DATA:  Headache EXAM: CT HEAD WITHOUT CONTRAST TECHNIQUE: Contiguous axial images were obtained from the base of the skull through the vertex without intravenous contrast. RADIATION DOSE REDUCTION: This exam was performed according to the departmental dose-optimization program which includes automated exposure control, adjustment of the mA and/or kV according to patient size and/or use of iterative reconstruction technique. COMPARISON:  None Available. FINDINGS: Brain: There is no acute intracranial hemorrhage, extra-axial fluid collection, or acute infarct Parenchymal volume is normal. The ventricles are normal in size. Gray-white differentiation is preserved The pituitary and suprasellar region are normal. There is no mass lesion. There is no mass effect or midline shift. Vascular: No hyperdense vessel or unexpected calcification. Skull: Normal. Negative for fracture or focal lesion. Sinuses/Orbits: The imaged paranasal sinuses are clear. The globes and orbits are unremarkable. Other: There are small bilateral mastoid effusions. IMPRESSION: 1. No acute intracranial pathology. 2. Small bilateral mastoid effusions. Electronically Signed   By: Lesia Hausen M.D.   On: 02/23/2023 13:15   DG Chest Port 1 View  Result Date: 02/23/2023 CLINICAL DATA:  Sepsis EXAM: PORTABLE CHEST 1 VIEW COMPARISON:  X-ray 01/09/2023 FINDINGS: No pneumothorax, effusion or edema. Calcified aorta. Overlapping cardiac leads. There is new widening of the mediastinum compared to prior x-ray and fullness of the right lung hilum. Recommend further evaluation with CT to delineate for potential mass. IMPRESSION: New widening of the mediastinum with masslike fullness in the right lung hilum.  Recommend follow up contrast CT to further delineate Electronically Signed   By: Karen Kays M.D.   On: 02/23/2023 13:11    Scheduled Meds:  dicyclomine  20 mg Oral Daily   diltiazem  15 mg Intravenous Once   fluticasone furoate-vilanterol  1 puff Inhalation Daily   ipratropium-albuterol  3 mL Nebulization Q6H   LORazepam       methylPREDNISolone (SOLU-MEDROL) injection  60 mg Intravenous Q12H   metoprolol tartrate  25 mg Oral BID   nicotine  21 mg Transdermal Daily   Continuous Infusions:  amiodarone 30 mg/hr (02/24/23 0105)   diltiazem (CARDIZEM) infusion Stopped (02/23/23 1951)     LOS: 1 day   Burnadette Pop, MD [facility] P6/15/2024, 8:26 AM

## 2023-02-25 DIAGNOSIS — R918 Other nonspecific abnormal finding of lung field: Secondary | ICD-10-CM | POA: Diagnosis not present

## 2023-02-25 LAB — COMPREHENSIVE METABOLIC PANEL
ALT: 28 U/L (ref 0–44)
AST: 44 U/L — ABNORMAL HIGH (ref 15–41)
Albumin: 3.4 g/dL — ABNORMAL LOW (ref 3.5–5.0)
Alkaline Phosphatase: 161 U/L — ABNORMAL HIGH (ref 38–126)
Anion gap: 14 (ref 5–15)
BUN: 14 mg/dL (ref 6–20)
CO2: 20 mmol/L — ABNORMAL LOW (ref 22–32)
Calcium: 9.6 mg/dL (ref 8.9–10.3)
Chloride: 104 mmol/L (ref 98–111)
Creatinine, Ser: 1.06 mg/dL — ABNORMAL HIGH (ref 0.44–1.00)
GFR, Estimated: >60 mL/min (ref >60)
Glucose, Bld: 142 mg/dL — ABNORMAL HIGH (ref 70–99)
Potassium: 3.7 mmol/L (ref 3.5–5.1)
Sodium: 138 mmol/L (ref 135–145)
Total Bilirubin: 0.7 mg/dL (ref 0.3–1.2)
Total Protein: 6.2 g/dL — ABNORMAL LOW (ref 6.5–8.1)

## 2023-02-25 LAB — CBC
HCT: 32.9 % — ABNORMAL LOW (ref 36.0–46.0)
Hemoglobin: 11 g/dL — ABNORMAL LOW (ref 12.0–15.0)
MCH: 31.9 pg (ref 26.0–34.0)
MCHC: 33.4 g/dL (ref 30.0–36.0)
MCV: 95.4 fL (ref 80.0–100.0)
Platelets: 45 10*3/uL — ABNORMAL LOW (ref 150–400)
RBC: 3.45 MIL/uL — ABNORMAL LOW (ref 3.87–5.11)
RDW: 14.1 % (ref 11.5–15.5)
WBC: 9.7 10*3/uL (ref 4.0–10.5)
nRBC: 0 % (ref 0.0–0.2)

## 2023-02-25 LAB — LACTIC ACID, PLASMA: Lactic Acid, Venous: 2.9 mmol/L (ref 0.5–1.9)

## 2023-02-25 NOTE — Consult Note (Addendum)
Chief Complaint: Patient was seen in consultation today for right neck mass  Referring Physician(s): Oley Balm, MD  Supervising Physician: Oley Balm  Patient Status: [facility] - In-pt  History of Present Illness: Ana Curry is a 52 y.o. female with PMH significant for vulvar intraepithelial neoplasia and tobacco abuse being seen today in relation to a right neck mass. Patient has been experiencing shortness of breath for several months and presented to [facility] ED on 02/23/23 after being told by her PCP to present for a right neck mass. Imaging revealed bulky right lower cervical lymphadenopathy concerning for metastatic disease or lymphoma. IR consulted for evaluation for possible image-guided biopsy.   Past Medical History:  Diagnosis Date   Hidradenitis suppurativa    History of abnormal cervical Pap smear    approx 2003  s/p LEEP   VIN III (vulvar intraepithelial neoplasia III)    Wears glasses     Past Surgical History:  Procedure Laterality Date   NO PAST SURGERIES     VULVA /PERINEUM BIOPSY N/A 07/12/2022   Procedure: VULVAR BIOPSY;  Surgeon: Carver Fila, MD;  Location: [facility];  Service: Gynecology;  Laterality: N/A;   VULVECTOMY PARTIAL N/A 07/12/2022   Procedure: WIDE EXCISION VULVA;  Surgeon: Carver Fila, MD;  Location: [facility];  Service: Gynecology;  Laterality: N/A;    Allergies: Codeine  Medications: Prior to Admission medications   Medication Sig Start Date End Date Taking? Authorizing Provider  acetaminophen (TYLENOL) 500 MG tablet Take 1,000 mg by mouth every 6 (six) hours as needed.   Yes [provider]  clotrimazole-betamethasone (LOTRISONE) cream Apply 1 Application topically daily. To feet. 02/23/23  Yes [provider]  dextromethorphan-guaiFENesin (MUCINEX DM) 30-600 MG 12hr tablet Take 1 tablet by mouth 2 (two) times daily as needed for cough.   Yes [provider]  diazepam (VALIUM) 10 MG tablet Take 10 mg by mouth daily as needed for anxiety. 02/23/23  Yes [provider]  dicyclomine (BENTYL) 20 MG tablet Take 20 mg by mouth daily.   Yes [provider]  HYDROcodone-acetaminophen (NORCO/VICODIN) 5-325 MG tablet Take 1 tablet by mouth daily as needed for moderate pain or severe pain. 11/27/22  Yes [provider]  predniSONE (DELTASONE) 20 MG tablet Take 10-40 mg by mouth daily. Take 40 mg (2 tablets) by mouth daily for 4 days, then 20 mg (1 tablet) for 4 days, then 10 mg (1/2 tablet) for 4 days. 02/07/23  Yes [provider]  triamcinolone ointment (KENALOG) 0.1 % Apply 1 Application topically daily as needed (Ear rash). 08/01/22 08/01/23 Yes [provider]  VENTOLIN HFA 108 (90 Base) MCG/ACT inhaler Inhale 2 puffs into the lungs every 4 (four) hours as needed for wheezing or shortness of breath. 01/09/23  Yes [provider]  cefPROZIL (CEFZIL) 250 MG tablet Take 250 mg by mouth 2 (two) times daily. Patient not taking: Reported on 02/23/2023 02/07/23   [provider]     Family History  Problem Relation Age of Onset   Non-Hodgkin's lymphoma Mother 51   Colon cancer Neg Hx    Breast cancer Neg Hx    Ovarian cancer Neg Hx    Endometrial cancer Neg Hx    Pancreatic cancer Neg Hx    Prostate cancer Neg Hx     Social History   Socioeconomic History   Marital status: Single    Spouse name: Not on file   Number  of children: 0   Years of education: Not on file   Highest education level: High school graduate  Occupational History   Not on file  Tobacco Use   Smoking status: Every Day    Packs/day: 1.5    Types: Cigarettes    Start date: 72   Smokeless tobacco: Never  Vaping Use   Vaping Use: Never used  Substance and Sexual Activity   Alcohol use: Not Currently   Drug use: Never   Sexual activity: Not Currently    Birth control/protection: None  Other Topics Concern   Not  on file  Social History Narrative   Not on file   Social Determinants of Health   Financial Resource Strain: Not on file  Food Insecurity: No Food Insecurity (03/17/2022)   Hunger Vital Sign    Worried About Running Out of Food in the Last Year: Never true    Ran Out of Food in the Last Year: Never true  Transportation Needs: No Transportation Needs (03/17/2022)   PRAPARE - Administrator, Civil Service (Medical): No    Lack of Transportation (Non-Medical): No  Physical Activity: Not on file  Stress: Not on file  Social Connections: Not on file    Code Status: Full code  Review of Systems: A 12 point ROS discussed and pertinent positives are indicated in the HPI above.  All other systems are negative.  Review of Systems  Constitutional:  Negative for chills and fever.  Respiratory:  Positive for chest tightness and shortness of breath.   Cardiovascular:  Negative for chest pain and leg swelling.  Gastrointestinal:  Positive for constipation. Negative for abdominal pain, diarrhea, nausea and vomiting.  Neurological:  Negative for dizziness and headaches.  Psychiatric/Behavioral:  Negative for confusion.     Vital Signs: BP 123/77 (BP Location: Right Arm)   Pulse 94   Temp 98.8 F (37.1 C) (Oral)   Resp 20   Ht 5\' 8"  (1.727 m)   Wt 157 lb (71.2 kg)   LMP 01/22/2017   SpO2 92%   BMI 23.87 kg/m     Physical Exam Vitals reviewed.  Constitutional:      General: She is not in acute distress.    Appearance: She is ill-appearing.  HENT:     Mouth/Throat:     Mouth: Mucous membranes are moist.  Cardiovascular:     Rate and Rhythm: Regular rhythm. Tachycardia present.     Pulses: Normal pulses.     Heart sounds: Normal heart sounds.  Pulmonary:     Effort: Pulmonary effort is normal. No respiratory distress.     Breath sounds: No stridor. Wheezing present. No rhonchi or rales.  Abdominal:     Palpations: Abdomen is soft.     Tenderness: There is no  abdominal tenderness.  Musculoskeletal:     Right lower leg: No edema.     Left lower leg: No edema.  Skin:    General: Skin is warm and dry.  Neurological:     Mental Status: She is alert and oriented to person, place, and time.  Psychiatric:        Mood and Affect: Mood normal.        Behavior: Behavior normal.        Thought Content: Thought content normal.        Judgment: Judgment normal.    Advanced Care Planning was discussed and patient has indicated that their father, Aalliyah Ricketts, is their Runner, broadcasting/film/video.  Imaging: ECHOCARDIOGRAM COMPLETE  Result Date: 02/24/2023    ECHOCARDIOGRAM REPORT   Patient Name:   Ana Curry Date of Exam: 02/24/2023 Medical Rec #:  [id number]         Height:       68.0 in Accession #:    [id number]        Weight:       157.0 lb Date of Birth:  April 29, 1971        BSA:          1.844 m Patient Age:    51 years          BP:           111/73 mmHg Patient Gender: F                 HR:           88 bpm. Exam Location:  Inpatient Procedure: 2D Echo, Cardiac Doppler and Color Doppler Indications:    A-FIB  History:        Patient has no prior history of Echocardiogram examinations.                 Arrythmias:Atrial Fibrillation; Risk Factors:Current Smoker.  Sonographer:    Darlys Gales Referring Phys: [id number] JACOB J STINSON IMPRESSIONS  1. Left ventricular ejection fraction, by estimation, is 60 to 65%. The left ventricle has normal function. The left ventricle has no regional wall motion abnormalities. Left ventricular diastolic parameters are indeterminate.  2. Right ventricular systolic function is normal. The right ventricular size is normal. Tricuspid regurgitation signal is inadequate for assessing PA pressure.  3. The mitral valve is normal in structure. No evidence of mitral valve regurgitation.  4. The aortic valve is grossly normal. Aortic valve regurgitation is not visualized.  5. The inferior vena cava is dilated in size with <50% respiratory  variability, suggesting right atrial pressure of 15 mmHg. FINDINGS  Left Ventricle: Left ventricular ejection fraction, by estimation, is 60 to 65%. The left ventricle has normal function. The left ventricle has no regional wall motion abnormalities. The left ventricular internal cavity size was normal in size. There is  no left ventricular hypertrophy. Left ventricular diastolic parameters are indeterminate. Right Ventricle: The right ventricular size is normal. Right ventricular systolic function is normal. Tricuspid regurgitation signal is inadequate for assessing PA pressure. Left Atrium: Left atrial size was normal in size. Right Atrium: Right atrial size was normal in size. Pericardium: There is no evidence of pericardial effusion. Mitral Valve: The mitral valve is normal in structure. No evidence of mitral valve regurgitation. Tricuspid Valve: Tricuspid valve regurgitation is not demonstrated. Aortic Valve: The aortic valve is grossly normal. Aortic valve regurgitation is not visualized. Aortic valve mean gradient measures 5.0 mmHg. Aortic valve peak gradient measures 7.6 mmHg. Aortic valve area, by VTI measures 2.82 cm. Pulmonic Valve: Pulmonic valve regurgitation is not visualized. Aorta: The aortic root and ascending aorta are structurally normal, with no evidence of dilitation. Venous: The inferior vena cava is dilated in size with less than 50% respiratory variability, suggesting right atrial pressure of 15 mmHg. IAS/Shunts: The interatrial septum was not well visualized.  LEFT VENTRICLE PLAX 2D LVIDd:         5.10 cm   Diastology LVIDs:         3.00 cm   LV e' medial:    7.07 cm/s LV PW:         0.80 cm   LV E/e' medial:  12.3 LV IVS:        0.70 cm   LV e' lateral:   10.20 cm/s LVOT diam:     1.80 cm   LV E/e' lateral: 8.5 LV SV:         76 LV SV Index:   41 LVOT Area:     2.54 cm  RIGHT VENTRICLE             IVC RV S prime:     15.10 cm/s  IVC diam: 2.50 cm TAPSE (M-mode): 2.5 cm LEFT ATRIUM              Index        RIGHT ATRIUM           Index LA Vol (A2C):   21.7 ml 11.77 ml/m  RA Area:     14.00 cm LA Vol (A4C):   27.3 ml 14.81 ml/m  RA Volume:   34.60 ml  18.76 ml/m LA Biplane Vol: 24.7 ml 13.40 ml/m  AORTIC VALVE AV Area (Vmax):    2.42 cm AV Area (Vmean):   2.26 cm AV Area (VTI):     2.82 cm AV Vmax:           138.00 cm/s AV Vmean:          102.000 cm/s AV VTI:            0.269 m AV Peak Grad:      7.6 mmHg AV Mean Grad:      5.0 mmHg LVOT Vmax:         131.00 cm/s LVOT Vmean:        90.500 cm/s LVOT VTI:          0.298 m LVOT/AV VTI ratio: 1.11  AORTA Ao Root diam: 3.20 cm Ao Asc diam:  3.50 cm MITRAL VALVE MV Area (PHT): 5.23 cm     SHUNTS MV Decel Time: 145 msec     Systemic VTI:  0.30 m MV E velocity: 87.00 cm/s   Systemic Diam: 1.80 cm MV A velocity: 102.00 cm/s MV E/A ratio:  0.85 Photographer signed by Carolan Clines Signature Date/Time: 02/24/2023/3:44:27 PM    Final    CT Angio Chest PE W and/or Wo Contrast  Result Date: 02/23/2023 CLINICAL DATA:  Shortness of breath, right neck mass, chest mass, headache * Tracking Code: BO * EXAM: CT ANGIOGRAPHY CHEST WITH CONTRAST TECHNIQUE: Multidetector CT imaging of the chest was performed using the standard protocol during bolus administration of intravenous contrast. Multiplanar CT image reconstructions and MIPs were obtained to evaluate the vascular anatomy. RADIATION DOSE REDUCTION: This exam was performed according to the departmental dose-optimization program which includes automated exposure control, adjustment of the mA and/or kV according to patient size and/or use of iterative reconstruction technique. CONTRAST:  75mL OMNIPAQUE IOHEXOL 350 MG/ML SOLN COMPARISON:  None Available. FINDINGS: Cardiovascular: No filling defect is identified in the pulmonary arterial tree to suggest pulmonary embolus. Mild atheromatous vascular calcification of the aortic arch. The large mediastinal mass bulges into the SVC posteriorly for example  on image 128 series 4, probably from extrinsic compression, definite invasion is not observed. Because of lack of complete opacification from pulmonary arterial timing, patency of the left brachiocephalic vein is uncertain. Moderate pericardial effusion. Mediastinum/Nodes: Large right infrahilar and hilar mass extending into the mediastinum and tracking all the way to the thoracic inlet. This extends around the right mainstem bronchus, right upper lobe bronchus, and bronchus intermedius as  well as the right lower lobe and right middle lobe bronchi, and likewise surrounds and narrows the right pulmonary artery and its branches. In the subcarinal region, the conglomerate mass measures about 7.9 by 5.6 cm (image 144, series 4) and the mass flattens and narrows the SVC without overt occlusion. In the right infrahilar region, the observed mass measures 4.7 by 3.9 cm on image 179 series 4. Presumed lymph node anterior to the SVC in the upper chest measures 2.6 cm in short axis on image 36 series 3. A right lower neck level IV mass/lymph node measures 3.4 cm in short axis on image 10 series 3. Lungs/Pleura: The mass in the right chest displaces the upper trachea to the left. There is substantial narrowing of the right upper lobe bronchus, right middle lobe bronchus, and severe narrowing of the right lower lobe bronchus due to the surrounding mass. Patchy regions of nodularity in the right lower lobe distal to the mass may represent postobstructive pneumonitis or satellite tumor nodules. Centrilobular emphysema. Scarring or atelectasis in the right middle lobe and right upper lobe anteriorly. Mild dependent atelectasis in both lower lobes. Trace right pleural effusion, image 50 series 3. Upper Abdomen: Small nonspecific hypodense lesion in the left hepatic lobe 0.9 cm in long axis on image 96 series 3. Musculoskeletal: Unremarkable Review of the MIP images confirms the above findings. IMPRESSION: 1. Large right infrahilar  and hilar mass extending into the mediastinum and tracking all the way to the thoracic inlet. This extends around the right mainstem bronchus, right upper lobe bronchus, and bronchus intermedius as well as the right lower lobe and right middle lobe bronchi, and likewise surrounds and narrows the right pulmonary artery and its branches. The mass flattens and narrows the SVC without overt occlusion, and is confluent with bulky right level IV neck adenopathy. Top differential diagnostic considerations include lung cancer (such as squamous cell or small cell) versus extensive thoracic and right lower neck lymphoma. 2. Patchy regions of nodularity in the right lower lobe distal to the mass may represent postobstructive pneumonitis or satellite tumor nodules. 3. Presumed lymph node anterior to the SVC in the upper chest measures 2.6 cm in short axis. 4. A right lower neck level IV mass/lymph node measures 3.4 cm in short axis, compatible with malignancy. 5. Moderate pericardial effusion. 6. Trace right pleural effusion. 7. Centrilobular emphysema. 8. Small nonspecific hypodense lesion in the left hepatic lobe 0.9 cm in long axis. 9. No pulmonary embolus is identified. 10. Aortic atherosclerosis. Aortic Atherosclerosis (ICD10-I70.0) and Emphysema (ICD10-J43.9). Electronically Signed   By: Gaylyn Rong M.D.   On: 02/23/2023 13:39   CT Soft Tissue Neck W Contrast  Result Date: 02/23/2023 CLINICAL DATA:  Neck mass, nonpulsatile right sided neck mass EXAM: CT NECK WITH CONTRAST TECHNIQUE: Multidetector CT imaging of the neck was performed using the standard protocol following the bolus administration of intravenous contrast. RADIATION DOSE REDUCTION: This exam was performed according to the departmental dose-optimization program which includes automated exposure control, adjustment of the mA and/or kV according to patient size and/or use of iterative reconstruction technique. CONTRAST:  75mL OMNIPAQUE IOHEXOL 350  MG/ML SOLN COMPARISON:  None Available. FINDINGS: Pharynx and larynx: Normal. No mass or swelling. Salivary glands: No inflammation, mass, or stone. Thyroid: Normal. Lymph nodes: There is bulky lower cervical and mediastinal lymphadenopathy involving the right level 3 and level 4 lymph node stations, the right supraclavicular region, and the anterior and posterior mediastinum. Cervical lymphadenopathy significant mass effect on  the right internal jugular vein (series 5, image 68). Vascular: Negative. Mass effect on the right internal jugular vein, as above Limited intracranial: Negative. Visualized orbits: Negative. Mastoids and visualized paranasal sinuses: Moderate left-sided mastoid effusion. There is pneumatization of the petrous apices with air-fluid levels. Paranasal sinuses are clear. Orbits are unremarkable. Skeleton: No acute or aggressive process. Upper chest: Mild paraseptal emphysema. See same day CT chest for additional findings Other: None. IMPRESSION: 1. Bulky right lower cervical and mediastinal lymphadenopathy, concerning for metastatic disease or lymphoma. 2. See separate CT chest for additional findings. Emphysema (ICD10-J43.9). Electronically Signed   By: Lorenza Cambridge M.D.   On: 02/23/2023 13:17   CT Head Wo Contrast  Result Date: 02/23/2023 CLINICAL DATA:  Headache EXAM: CT HEAD WITHOUT CONTRAST TECHNIQUE: Contiguous axial images were obtained from the base of the skull through the vertex without intravenous contrast. RADIATION DOSE REDUCTION: This exam was performed according to the departmental dose-optimization program which includes automated exposure control, adjustment of the mA and/or kV according to patient size and/or use of iterative reconstruction technique. COMPARISON:  None Available. FINDINGS: Brain: There is no acute intracranial hemorrhage, extra-axial fluid collection, or acute infarct Parenchymal volume is normal. The ventricles are normal in size. Gray-white  differentiation is preserved The pituitary and suprasellar region are normal. There is no mass lesion. There is no mass effect or midline shift. Vascular: No hyperdense vessel or unexpected calcification. Skull: Normal. Negative for fracture or focal lesion. Sinuses/Orbits: The imaged paranasal sinuses are clear. The globes and orbits are unremarkable. Other: There are small bilateral mastoid effusions. IMPRESSION: 1. No acute intracranial pathology. 2. Small bilateral mastoid effusions. Electronically Signed   By: Lesia Hausen M.D.   On: 02/23/2023 13:15   DG Chest Port 1 View  Result Date: 02/23/2023 CLINICAL DATA:  Sepsis EXAM: PORTABLE CHEST 1 VIEW COMPARISON:  X-ray 01/09/2023 FINDINGS: No pneumothorax, effusion or edema. Calcified aorta. Overlapping cardiac leads. There is new widening of the mediastinum compared to prior x-ray and fullness of the right lung hilum. Recommend further evaluation with CT to delineate for potential mass. IMPRESSION: New widening of the mediastinum with masslike fullness in the right lung hilum. Recommend follow up contrast CT to further delineate Electronically Signed   By: Karen Kays M.D.   On: 02/23/2023 13:11    Labs:  CBC: Recent Labs    10/29/22 2049 02/23/23 1128 02/24/23 0109 02/24/23 1014 02/25/23 0142  WBC 7.9 8.0 8.9  --  9.7  HGB 13.9 13.8 11.8*  --  11.0*  HCT 40.7 39.9 34.2*  --  32.9*  PLT 174 50* 44* 46* 45*    COAGS: Recent Labs    02/23/23 1128 02/24/23 1014  INR 1.1 1.1  APTT  --  24    BMP: Recent Labs    02/23/23 1128 02/23/23 1859 02/24/23 0109 02/25/23 0142  NA 133* 135 134* 138  K 3.2* 3.4* 4.1 3.7  CL 98 103 102 104  CO2 20* 20* 19* 20*  GLUCOSE 130* 150* 165* 142*  BUN 18 15 14 14   CALCIUM 10.1 9.5 9.6 9.6  CREATININE 1.18* 1.16* 1.16* 1.06*  GFRNONAA 56* 57* 57* >60    LIVER FUNCTION TESTS: Recent Labs    02/23/23 1128 02/24/23 0109 02/25/23 0142  BILITOT 0.9 0.6 0.7  AST 58* 52* 44*  ALT 27 27 28    ALKPHOS 203* 177* 161*  PROT 7.3 6.3* 6.2*  ALBUMIN 4.0 3.5 3.4*    TUMOR MARKERS: No results  for input(s): "AFPTM", "CEA", "CA199", "CHROMGRNA" in the last 8760 hours.  Assessment and Plan:  Anaka Bajaj is a 52 yo female being seen today in relation to a right neck mass. Imaging has revealed right lower cervical lymphadenopathy concerning for malignancy. Case has been reviewed and approved by Dr Deanne Coffer for image-guided right neck mass biopsy with moderate sedation tentatively for 02/26/23. Patient has been made NPO for midnight and appropriate labs have been ordered.  Risks and benefits of image-guided right neck mass biopsy was discussed with the patient and/or patient's family including, but not limited to bleeding, infection, damage to adjacent structures or low yield requiring additional tests.  All of the questions were answered and there is agreement to proceed.  Consent signed and in IR suite.   Thank you for this interesting consult.  I greatly enjoyed meeting Ana Curry and look forward to participating in their care.  A copy of this report was sent to the requesting provider on this date.  Electronically Signed: Kennieth Francois, PA-C 02/25/2023, 11:13 AM   I spent a total of 40 Minutes in face to face in clinical consultation, greater than 50% of which was counseling/coordinating care for right neck mass.

## 2023-02-25 NOTE — Progress Notes (Signed)
Subjective:  Patient denies any anginal chest pain complains of right-sided pleuritic pain denies any hemoptysis states received breathing treatment with improvement in her chest pain and breathing.  Patient remains in sinus rhythm.  No active cardiac issues.  Objective:  Vital Signs in the last 24 hours: Temp:  [98.2 F (36.8 C)-98.9 F (37.2 C)] 98.8 F (37.1 C) (06/16 0000) Pulse Rate:  [89-94] 94 (06/15 1510) Resp:  [20] 20 (06/15 1510) BP: (107-123)/(73-77) 123/77 (06/16 0000) SpO2:  [92 %-96 %] 92 % (06/16 0000) FiO2 (%):  [28 %] 28 % (06/15 0837)  Intake/Output from previous day: 06/15 0701 - 06/16 0700 In: 2401 [P.O.:240; I.V.:2161] Out: -  Intake/Output from this shift: No intake/output data recorded.  Physical Exam: Neck: no carotid bruit, no JVD, thyroid not enlarged, symmetric, no tenderness/mass/nodules, and right supraclavicular/neck swelling noted Lungs: Right lung field expiratory wheezing noted left clear Heart: regular rate and rhythm, S1, S2 normal, and no pericardial rub Abdomen: soft, non-tender; bowel sounds normal; no masses,  no organomegaly Extremities: extremities normal, atraumatic, no cyanosis or edema  Lab Results: Recent Labs    02/24/23 0109 02/24/23 1014 02/25/23 0142  WBC 8.9  --  9.7  HGB 11.8*  --  11.0*  PLT 44* 46* 45*   Recent Labs    02/24/23 0109 02/25/23 0142  NA 134* 138  K 4.1 3.7  CL 102 104  CO2 19* 20*  GLUCOSE 165* 142*  BUN 14 14  CREATININE 1.16* 1.06*   No results for input(s): "TROPONINI" in the last 72 hours.  Invalid input(s): "CK", "MB" Hepatic Function Panel Recent Labs    02/25/23 0142  PROT 6.2*  ALBUMIN 3.4*  AST 44*  ALT 28  ALKPHOS 161*  BILITOT 0.7   No results for input(s): "CHOL" in the last 72 hours. No results for input(s): "PROTIME" in the last 72 hours.  Imaging: Imaging results have been reviewed and ECHOCARDIOGRAM COMPLETE  Result Date: 02/24/2023    ECHOCARDIOGRAM REPORT    Patient Name:   LOURAINE HERBST Date of Exam: 02/24/2023 Medical Rec #:  [id number]         Height:       68.0 in Accession #:    [id number]        Weight:       157.0 lb Date of Birth:  [birth year]        BSA:          1.844 m Patient Age:    51 years          BP:           111/73 mmHg Patient Gender: F                 HR:           88 bpm. Exam Location:  Inpatient Procedure: 2D Echo, Cardiac Doppler and Color Doppler Indications:    A-FIB  History:        Patient has no prior history of Echocardiogram examinations.                 Arrythmias:Atrial Fibrillation; Risk Factors:Current Smoker.  Sonographer:    Darlys Gales Referring Phys: [phone] JACOB J STINSON IMPRESSIONS  1. Left ventricular ejection fraction, by estimation, is 60 to 65%. The left ventricle has normal function. The left ventricle has no regional wall motion abnormalities. Left ventricular diastolic parameters are indeterminate.  2. Right ventricular systolic function is normal. The right ventricular size  is normal. Tricuspid regurgitation signal is inadequate for assessing PA pressure.  3. The mitral valve is normal in structure. No evidence of mitral valve regurgitation.  4. The aortic valve is grossly normal. Aortic valve regurgitation is not visualized.  5. The inferior vena cava is dilated in size with <50% respiratory variability, suggesting right atrial pressure of 15 mmHg. FINDINGS  Left Ventricle: Left ventricular ejection fraction, by estimation, is 60 to 65%. The left ventricle has normal function. The left ventricle has no regional wall motion abnormalities. The left ventricular internal cavity size was normal in size. There is  no left ventricular hypertrophy. Left ventricular diastolic parameters are indeterminate. Right Ventricle: The right ventricular size is normal. Right ventricular systolic function is normal. Tricuspid regurgitation signal is inadequate for assessing PA pressure. Left Atrium: Left atrial size was normal in size.  Right Atrium: Right atrial size was normal in size. Pericardium: There is no evidence of pericardial effusion. Mitral Valve: The mitral valve is normal in structure. No evidence of mitral valve regurgitation. Tricuspid Valve: Tricuspid valve regurgitation is not demonstrated. Aortic Valve: The aortic valve is grossly normal. Aortic valve regurgitation is not visualized. Aortic valve mean gradient measures 5.0 mmHg. Aortic valve peak gradient measures 7.6 mmHg. Aortic valve area, by VTI measures 2.82 cm. Pulmonic Valve: Pulmonic valve regurgitation is not visualized. Aorta: The aortic root and ascending aorta are structurally normal, with no evidence of dilitation. Venous: The inferior vena cava is dilated in size with less than 50% respiratory variability, suggesting right atrial pressure of 15 mmHg. IAS/Shunts: The interatrial septum was not well visualized.  LEFT VENTRICLE PLAX 2D LVIDd:         5.10 cm   Diastology LVIDs:         3.00 cm   LV e' medial:    7.07 cm/s LV PW:         0.80 cm   LV E/e' medial:  12.3 LV IVS:        0.70 cm   LV e' lateral:   10.20 cm/s LVOT diam:     1.80 cm   LV E/e' lateral: 8.5 LV SV:         76 LV SV Index:   41 LVOT Area:     2.54 cm  RIGHT VENTRICLE             IVC RV S prime:     15.10 cm/s  IVC diam: 2.50 cm TAPSE (M-mode): 2.5 cm LEFT ATRIUM             Index        RIGHT ATRIUM           Index LA Vol (A2C):   21.7 ml 11.77 ml/m  RA Area:     14.00 cm LA Vol (A4C):   27.3 ml 14.81 ml/m  RA Volume:   34.60 ml  18.76 ml/m LA Biplane Vol: 24.7 ml 13.40 ml/m  AORTIC VALVE AV Area (Vmax):    2.42 cm AV Area (Vmean):   2.26 cm AV Area (VTI):     2.82 cm AV Vmax:           138.00 cm/s AV Vmean:          102.000 cm/s AV VTI:            0.269 m AV Peak Grad:      7.6 mmHg AV Mean Grad:      5.0 mmHg LVOT Vmax:  131.00 cm/s LVOT Vmean:        90.500 cm/s LVOT VTI:          0.298 m LVOT/AV VTI ratio: 1.11  AORTA Ao Root diam: 3.20 cm Ao Asc diam:  3.50 cm MITRAL VALVE  MV Area (PHT): 5.23 cm     SHUNTS MV Decel Time: 145 msec     Systemic VTI:  0.30 m MV E velocity: 87.00 cm/s   Systemic Diam: 1.80 cm MV A velocity: 102.00 cm/s MV E/A ratio:  0.85 Photographer signed by Carolan Clines Signature Date/Time: 02/24/2023/3:44:27 PM    Final    CT Angio Chest PE W and/or Wo Contrast  Result Date: 02/23/2023 CLINICAL DATA:  Shortness of breath, right neck mass, chest mass, headache * Tracking Code: BO * EXAM: CT ANGIOGRAPHY CHEST WITH CONTRAST TECHNIQUE: Multidetector CT imaging of the chest was performed using the standard protocol during bolus administration of intravenous contrast. Multiplanar CT image reconstructions and MIPs were obtained to evaluate the vascular anatomy. RADIATION DOSE REDUCTION: This exam was performed according to the departmental dose-optimization program which includes automated exposure control, adjustment of the mA and/or kV according to patient size and/or use of iterative reconstruction technique. CONTRAST:  75mL OMNIPAQUE IOHEXOL 350 MG/ML SOLN COMPARISON:  None Available. FINDINGS: Cardiovascular: No filling defect is identified in the pulmonary arterial tree to suggest pulmonary embolus. Mild atheromatous vascular calcification of the aortic arch. The large mediastinal mass bulges into the SVC posteriorly for example on image 128 series 4, probably from extrinsic compression, definite invasion is not observed. Because of lack of complete opacification from pulmonary arterial timing, patency of the left brachiocephalic vein is uncertain. Moderate pericardial effusion. Mediastinum/Nodes: Large right infrahilar and hilar mass extending into the mediastinum and tracking all the way to the thoracic inlet. This extends around the right mainstem bronchus, right upper lobe bronchus, and bronchus intermedius as well as the right lower lobe and right middle lobe bronchi, and likewise surrounds and narrows the right pulmonary artery and its branches.  In the subcarinal region, the conglomerate mass measures about 7.9 by 5.6 cm (image 144, series 4) and the mass flattens and narrows the SVC without overt occlusion. In the right infrahilar region, the observed mass measures 4.7 by 3.9 cm on image 179 series 4. Presumed lymph node anterior to the SVC in the upper chest measures 2.6 cm in short axis on image 36 series 3. A right lower neck level IV mass/lymph node measures 3.4 cm in short axis on image 10 series 3. Lungs/Pleura: The mass in the right chest displaces the upper trachea to the left. There is substantial narrowing of the right upper lobe bronchus, right middle lobe bronchus, and severe narrowing of the right lower lobe bronchus due to the surrounding mass. Patchy regions of nodularity in the right lower lobe distal to the mass may represent postobstructive pneumonitis or satellite tumor nodules. Centrilobular emphysema. Scarring or atelectasis in the right middle lobe and right upper lobe anteriorly. Mild dependent atelectasis in both lower lobes. Trace right pleural effusion, image 50 series 3. Upper Abdomen: Small nonspecific hypodense lesion in the left hepatic lobe 0.9 cm in long axis on image 96 series 3. Musculoskeletal: Unremarkable Review of the MIP images confirms the above findings. IMPRESSION: 1. Large right infrahilar and hilar mass extending into the mediastinum and tracking all the way to the thoracic inlet. This extends around the right mainstem bronchus, right upper lobe bronchus, and bronchus intermedius as well  as the right lower lobe and right middle lobe bronchi, and likewise surrounds and narrows the right pulmonary artery and its branches. The mass flattens and narrows the SVC without overt occlusion, and is confluent with bulky right level IV neck adenopathy. Top differential diagnostic considerations include lung cancer (such as squamous cell or small cell) versus extensive thoracic and right lower neck lymphoma. 2. Patchy regions  of nodularity in the right lower lobe distal to the mass may represent postobstructive pneumonitis or satellite tumor nodules. 3. Presumed lymph node anterior to the SVC in the upper chest measures 2.6 cm in short axis. 4. A right lower neck level IV mass/lymph node measures 3.4 cm in short axis, compatible with malignancy. 5. Moderate pericardial effusion. 6. Trace right pleural effusion. 7. Centrilobular emphysema. 8. Small nonspecific hypodense lesion in the left hepatic lobe 0.9 cm in long axis. 9. No pulmonary embolus is identified. 10. Aortic atherosclerosis. Aortic Atherosclerosis (ICD10-I70.0) and Emphysema (ICD10-J43.9). Electronically Signed   By: Gaylyn Rong M.D.   On: 02/23/2023 13:39   CT Soft Tissue Neck W Contrast  Result Date: 02/23/2023 CLINICAL DATA:  Neck mass, nonpulsatile right sided neck mass EXAM: CT NECK WITH CONTRAST TECHNIQUE: Multidetector CT imaging of the neck was performed using the standard protocol following the bolus administration of intravenous contrast. RADIATION DOSE REDUCTION: This exam was performed according to the departmental dose-optimization program which includes automated exposure control, adjustment of the mA and/or kV according to patient size and/or use of iterative reconstruction technique. CONTRAST:  75mL OMNIPAQUE IOHEXOL 350 MG/ML SOLN COMPARISON:  None Available. FINDINGS: Pharynx and larynx: Normal. No mass or swelling. Salivary glands: No inflammation, mass, or stone. Thyroid: Normal. Lymph nodes: There is bulky lower cervical and mediastinal lymphadenopathy involving the right level 3 and level 4 lymph node stations, the right supraclavicular region, and the anterior and posterior mediastinum. Cervical lymphadenopathy significant mass effect on the right internal jugular vein (series 5, image 68). Vascular: Negative. Mass effect on the right internal jugular vein, as above Limited intracranial: Negative. Visualized orbits: Negative. Mastoids and  visualized paranasal sinuses: Moderate left-sided mastoid effusion. There is pneumatization of the petrous apices with air-fluid levels. Paranasal sinuses are clear. Orbits are unremarkable. Skeleton: No acute or aggressive process. Upper chest: Mild paraseptal emphysema. See same day CT chest for additional findings Other: None. IMPRESSION: 1. Bulky right lower cervical and mediastinal lymphadenopathy, concerning for metastatic disease or lymphoma. 2. See separate CT chest for additional findings. Emphysema (ICD10-J43.9). Electronically Signed   By: Lorenza Cambridge M.D.   On: 02/23/2023 13:17   CT Head Wo Contrast  Result Date: 02/23/2023 CLINICAL DATA:  Headache EXAM: CT HEAD WITHOUT CONTRAST TECHNIQUE: Contiguous axial images were obtained from the base of the skull through the vertex without intravenous contrast. RADIATION DOSE REDUCTION: This exam was performed according to the departmental dose-optimization program which includes automated exposure control, adjustment of the mA and/or kV according to patient size and/or use of iterative reconstruction technique. COMPARISON:  None Available. FINDINGS: Brain: There is no acute intracranial hemorrhage, extra-axial fluid collection, or acute infarct Parenchymal volume is normal. The ventricles are normal in size. Gray-white differentiation is preserved The pituitary and suprasellar region are normal. There is no mass lesion. There is no mass effect or midline shift. Vascular: No hyperdense vessel or unexpected calcification. Skull: Normal. Negative for fracture or focal lesion. Sinuses/Orbits: The imaged paranasal sinuses are clear. The globes and orbits are unremarkable. Other: There are small bilateral mastoid effusions. IMPRESSION:  1. No acute intracranial pathology. 2. Small bilateral mastoid effusions. Electronically Signed   By: Lesia Hausen M.D.   On: 02/23/2023 13:15   DG Chest Port 1 View  Result Date: 02/23/2023 CLINICAL DATA:  Sepsis EXAM:  PORTABLE CHEST 1 VIEW COMPARISON:  X-ray 01/09/2023 FINDINGS: No pneumothorax, effusion or edema. Calcified aorta. Overlapping cardiac leads. There is new widening of the mediastinum compared to prior x-ray and fullness of the right lung hilum. Recommend further evaluation with CT to delineate for potential mass. IMPRESSION: New widening of the mediastinum with masslike fullness in the right lung hilum. Recommend follow up contrast CT to further delineate Electronically Signed   By: Karen Kays M.D.   On: 02/23/2023 13:11    Cardiac Studies:  Assessment/Plan:  Status post paroxysmal A-fib with RVR Right lung mass workup in progress Exacerbation of COPD Thrombocytopenia History of tobacco abuse Plan DC IV amiodarone switched to p.o. 200 mg daily for now I will sign off please call if needed Follow-up with me as needed  LOS: 2 days    Rinaldo Cloud 02/25/2023, 7:18 AM

## 2023-02-25 NOTE — Progress Notes (Signed)
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  Patient was seen by her primary care physician couple of times and was given antibiotics and was also diagnosed with pneumonia but she had no wheezing, coughing.  Recently noted to have right neck mass.  On presentation,she was hypoxic and requiring supplemental oxygen.  CT chest showed right infrahilar/hilar mass that extends around the right mainstem bronchus, the right upper lobe and all the way to the thoracic inlet with bulky adenopathy.  EKG showed A-fib with RVR.  Given iv  Cardizem.  Lab work showed potassium 3.2, alkaline phosphatase of 203, lactate of 3,  Platelets of 50.  Cardiology, PCCM,oncology consulted.  Plan for IR guided cervical node biopsy.  Assessment & Plan:  Principal Problem:   Lung mass Active Problems:   Atrial fibrillation with RVR (HCC)   Hypokalemia   Acute respiratory failure with hypoxia (HCC)   Thrombocytopenia (HCC)   COPD with acute exacerbation (HCC)   Lactic acidosis   Acute respiratory failure with hypoxia: Likely secondary to right lung mass/concomitant COPD.  On supplemental 2 L oxygen.  Not on oxygen at home.  Will continue to wean.  Right lung mass: Presented with shortness of breath.  Imaging showed  right infrahilar/hilar mass that extends around the right mainstem bronchus, the right upper lobe and all the way to the thoracic inlet with bulky adenopathy, likely lung cancer but could be lymphoma too.  Family history of lymphoma.  Case was also discussed with cardiothoracic surgery, not a candidate for surgery.  Plan for tissue biopsy.  PCCM/oncology consulted.  Plan for cervical lymph node biopsy.  Also has elevated LDH, uric acid  level LDH, uric acid elevated with concern for tumor lysis syndrome.  Elevated D-dimer.  Continue IV fluids, allopurinol. We may need to do CT abdomen/pelvis with contrast at some point.  Suspected COPD: No history of COPD in the past.  Presented with wheezing, shortness of breath.  Patient is a smoker.  Started on bronchodilators  A-fib with RVR: New onset.  Initially started on Cardizem drip, amiodarone drip.  CHA2DS2-VASc score low,doesnot   not need anticoagulation.  Patient also has severe thrombocytopenia.  TSH normal.  2D echo ordered. She converted to  normal sinus rhythm .  Amiodarone changed to oral.  Lactic acidosis: Continue IV fluids  Thrombocytopenia: No bleeding.  Likely associated with malignancy/DIC. Oncology/hematology following   Hypokalemia: Resolved  Tobacco use: Counseled cessation.  Has history of 50+ pack year of smoking.         DVT prophylaxis:SCDs Start: 02/23/23 2132     Code Status: Full Code  Family Communication: None at the bedside  Patient status:Inpatient  Patient is from :home  Anticipated discharge NF:AOZH  Estimated DC date:2-3 days, after full workup   Consultants: PCCM,cardiology  Procedures:none  Antimicrobials:  Anti-infectives (From admission, onward)    None       Subjective: Patient seen and examined at bedside today.  Hemodynamically stable.  On 1 to 2 L of oxygen per minute.  Not in respiratory distress.  Denies any abdomen pain, nausea or vomiting.  She states she is anxious about her diagnosis and upcoming procedure  Objective: Vitals:   02/24/23 0915 02/24/23 1235 02/24/23 1510 02/25/23 0000  BP: 111/73 107/73  109/76 123/77  Pulse: 92 89 94   Resp: 20 20 20    Temp: 98.2 F (36.8 C) 98.4 F (36.9 C) 98.9 F (37.2 C) 98.8 F (37.1 C)  TempSrc: Oral Oral Oral Oral  SpO2: 94% 96% 93% 92%  Weight:      Height:        Intake/Output Summary (Last 24 hours) at 02/25/2023 1130 Last data filed at 02/25/2023  1610 Gross per 24 hour  Intake 2400.99 ml  Output --  Net 2400.99 ml   Filed Weights   02/23/23 1108  Weight: 71.2 kg    Examination:  General exam: Overall comfortable, not in distress HEENT: PERRL Respiratory system:  no wheezes or crackles , mild diminished sounds on the right side. Cardiovascular system: S1 & S2 heard, RRR.  Gastrointestinal system: Abdomen is nondistended, soft and nontender. Central nervous system: Alert and oriented Extremities: No edema, no clubbing ,no cyanosis Skin: No rashes, no ulcers,no icterus     Data Reviewed: I have personally reviewed following labs and imaging studies  CBC: Recent Labs  Lab 02/23/23 1128 02/24/23 0109 02/24/23 1014 02/25/23 0142  WBC 8.0 8.9  --  9.7  NEUTROABS 6.0  --   --   --   HGB 13.8 11.8*  --  11.0*  HCT 39.9 34.2*  --  32.9*  MCV 95.0 95.3  --  95.4  PLT 50* 44* 46* 45*   Basic Metabolic Panel: Recent Labs  Lab 02/23/23 1128 02/23/23 1859 02/24/23 0109 02/25/23 0142  NA 133* 135 134* 138  K 3.2* 3.4* 4.1 3.7  CL 98 103 102 104  CO2 20* 20* 19* 20*  GLUCOSE 130* 150* 165* 142*  BUN 18 15 14 14   CREATININE 1.18* 1.16* 1.16* 1.06*  CALCIUM 10.1 9.5 9.6 9.6     Recent Results (from the past 240 hour(s))  Culture, blood (Routine x 2)     Status: None (Preliminary result)   Collection Time: 02/23/23 11:28 AM   Specimen: BLOOD  Result Value Ref Range Status   Specimen Description BLOOD BLOOD RIGHT ARM  Final   Special Requests   Final    BOTTLES DRAWN AEROBIC AND ANAEROBIC Blood Culture adequate volume   Culture   Final    NO GROWTH 2 DAYS Performed at [facility], [address] [place], [place] [address]    Report Status PENDING  Incomplete  Culture, blood (Routine x 2)     Status: None (Preliminary result)   Collection Time: 02/23/23 11:29 AM   Specimen: BLOOD  Result Value Ref Range Status   Specimen Description BLOOD BLOOD LEFT ARM LAC  Final   Special Requests   Final    BOTTLES  DRAWN AEROBIC AND ANAEROBIC Blood Culture adequate volume   Culture   Final    NO GROWTH 2 DAYS Performed at [facility], [address] [place], [place] [address]    Report Status PENDING  Incomplete     Radiology Studies: ECHOCARDIOGRAM COMPLETE  Result Date: 02/24/2023    ECHOCARDIOGRAM REPORT   Patient Name:   ANDJELA HJORTH Date of Exam: 02/24/2023 Medical Rec #:  [id number]         Height:       68.0 in Accession #:    [id number]        Weight:       157.0 lb Date of Birth:  [birth year]        BSA:  1.844 m Patient Age:    51 years          BP:           111/73 mmHg Patient Gender: F                 HR:           88 bpm. Exam Location:  Inpatient Procedure: 2D Echo, Cardiac Doppler and Color Doppler Indications:    A-FIB  History:        Patient has no prior history of Echocardiogram examinations.                 Arrythmias:Atrial Fibrillation; Risk Factors:Current Smoker.  Sonographer:    Darlys Gales Referring Phys: [phone] JACOB J STINSON IMPRESSIONS  1. Left ventricular ejection fraction, by estimation, is 60 to 65%. The left ventricle has normal function. The left ventricle has no regional wall motion abnormalities. Left ventricular diastolic parameters are indeterminate.  2. Right ventricular systolic function is normal. The right ventricular size is normal. Tricuspid regurgitation signal is inadequate for assessing PA pressure.  3. The mitral valve is normal in structure. No evidence of mitral valve regurgitation.  4. The aortic valve is grossly normal. Aortic valve regurgitation is not visualized.  5. The inferior vena cava is dilated in size with <50% respiratory variability, suggesting right atrial pressure of 15 mmHg. FINDINGS  Left Ventricle: Left ventricular ejection fraction, by estimation, is 60 to 65%. The left ventricle has normal function. The left ventricle has no regional wall motion abnormalities. The left ventricular internal cavity size was normal in size. There is  no  left ventricular hypertrophy. Left ventricular diastolic parameters are indeterminate. Right Ventricle: The right ventricular size is normal. Right ventricular systolic function is normal. Tricuspid regurgitation signal is inadequate for assessing PA pressure. Left Atrium: Left atrial size was normal in size. Right Atrium: Right atrial size was normal in size. Pericardium: There is no evidence of pericardial effusion. Mitral Valve: The mitral valve is normal in structure. No evidence of mitral valve regurgitation. Tricuspid Valve: Tricuspid valve regurgitation is not demonstrated. Aortic Valve: The aortic valve is grossly normal. Aortic valve regurgitation is not visualized. Aortic valve mean gradient measures 5.0 mmHg. Aortic valve peak gradient measures 7.6 mmHg. Aortic valve area, by VTI measures 2.82 cm. Pulmonic Valve: Pulmonic valve regurgitation is not visualized. Aorta: The aortic root and ascending aorta are structurally normal, with no evidence of dilitation. Venous: The inferior vena cava is dilated in size with less than 50% respiratory variability, suggesting right atrial pressure of 15 mmHg. IAS/Shunts: The interatrial septum was not well visualized.  LEFT VENTRICLE PLAX 2D LVIDd:         5.10 cm   Diastology LVIDs:         3.00 cm   LV e' medial:    7.07 cm/s LV PW:         0.80 cm   LV E/e' medial:  12.3 LV IVS:        0.70 cm   LV e' lateral:   10.20 cm/s LVOT diam:     1.80 cm   LV E/e' lateral: 8.5 LV SV:         76 LV SV Index:   41 LVOT Area:     2.54 cm  RIGHT VENTRICLE             IVC RV S prime:     15.10 cm/s  IVC diam: 2.50 cm  TAPSE (M-mode): 2.5 cm LEFT ATRIUM             Index        RIGHT ATRIUM           Index LA Vol (A2C):   21.7 ml 11.77 ml/m  RA Area:     14.00 cm LA Vol (A4C):   27.3 ml 14.81 ml/m  RA Volume:   34.60 ml  18.76 ml/m LA Biplane Vol: 24.7 ml 13.40 ml/m  AORTIC VALVE AV Area (Vmax):    2.42 cm AV Area (Vmean):   2.26 cm AV Area (VTI):     2.82 cm AV Vmax:            138.00 cm/s AV Vmean:          102.000 cm/s AV VTI:            0.269 m AV Peak Grad:      7.6 mmHg AV Mean Grad:      5.0 mmHg LVOT Vmax:         131.00 cm/s LVOT Vmean:        90.500 cm/s LVOT VTI:          0.298 m LVOT/AV VTI ratio: 1.11  AORTA Ao Root diam: 3.20 cm Ao Asc diam:  3.50 cm MITRAL VALVE MV Area (PHT): 5.23 cm     SHUNTS MV Decel Time: 145 msec     Systemic VTI:  0.30 m MV E velocity: 87.00 cm/s   Systemic Diam: 1.80 cm MV A velocity: 102.00 cm/s MV E/A ratio:  0.85 Photographer signed by Carolan Clines Signature Date/Time: 02/24/2023/3:44:27 PM    Final    CT Angio Chest PE W and/or Wo Contrast  Result Date: 02/23/2023 CLINICAL DATA:  Shortness of breath, right neck mass, chest mass, headache * Tracking Code: BO * EXAM: CT ANGIOGRAPHY CHEST WITH CONTRAST TECHNIQUE: Multidetector CT imaging of the chest was performed using the standard protocol during bolus administration of intravenous contrast. Multiplanar CT image reconstructions and MIPs were obtained to evaluate the vascular anatomy. RADIATION DOSE REDUCTION: This exam was performed according to the departmental dose-optimization program which includes automated exposure control, adjustment of the mA and/or kV according to patient size and/or use of iterative reconstruction technique. CONTRAST:  75mL OMNIPAQUE IOHEXOL 350 MG/ML SOLN COMPARISON:  None Available. FINDINGS: Cardiovascular: No filling defect is identified in the pulmonary arterial tree to suggest pulmonary embolus. Mild atheromatous vascular calcification of the aortic arch. The large mediastinal mass bulges into the SVC posteriorly for example on image 128 series 4, probably from extrinsic compression, definite invasion is not observed. Because of lack of complete opacification from pulmonary arterial timing, patency of the left brachiocephalic vein is uncertain. Moderate pericardial effusion. Mediastinum/Nodes: Large right infrahilar and hilar mass extending  into the mediastinum and tracking all the way to the thoracic inlet. This extends around the right mainstem bronchus, right upper lobe bronchus, and bronchus intermedius as well as the right lower lobe and right middle lobe bronchi, and likewise surrounds and narrows the right pulmonary artery and its branches. In the subcarinal region, the conglomerate mass measures about 7.9 by 5.6 cm (image 144, series 4) and the mass flattens and narrows the SVC without overt occlusion. In the right infrahilar region, the observed mass measures 4.7 by 3.9 cm on image 179 series 4. Presumed lymph node anterior to the SVC in the upper chest measures 2.6 cm in short axis on image 36 series  3. A right lower neck level IV mass/lymph node measures 3.4 cm in short axis on image 10 series 3. Lungs/Pleura: The mass in the right chest displaces the upper trachea to the left. There is substantial narrowing of the right upper lobe bronchus, right middle lobe bronchus, and severe narrowing of the right lower lobe bronchus due to the surrounding mass. Patchy regions of nodularity in the right lower lobe distal to the mass may represent postobstructive pneumonitis or satellite tumor nodules. Centrilobular emphysema. Scarring or atelectasis in the right middle lobe and right upper lobe anteriorly. Mild dependent atelectasis in both lower lobes. Trace right pleural effusion, image 50 series 3. Upper Abdomen: Small nonspecific hypodense lesion in the left hepatic lobe 0.9 cm in long axis on image 96 series 3. Musculoskeletal: Unremarkable Review of the MIP images confirms the above findings. IMPRESSION: 1. Large right infrahilar and hilar mass extending into the mediastinum and tracking all the way to the thoracic inlet. This extends around the right mainstem bronchus, right upper lobe bronchus, and bronchus intermedius as well as the right lower lobe and right middle lobe bronchi, and likewise surrounds and narrows the right pulmonary artery and  its branches. The mass flattens and narrows the SVC without overt occlusion, and is confluent with bulky right level IV neck adenopathy. Top differential diagnostic considerations include lung cancer (such as squamous cell or small cell) versus extensive thoracic and right lower neck lymphoma. 2. Patchy regions of nodularity in the right lower lobe distal to the mass may represent postobstructive pneumonitis or satellite tumor nodules. 3. Presumed lymph node anterior to the SVC in the upper chest measures 2.6 cm in short axis. 4. A right lower neck level IV mass/lymph node measures 3.4 cm in short axis, compatible with malignancy. 5. Moderate pericardial effusion. 6. Trace right pleural effusion. 7. Centrilobular emphysema. 8. Small nonspecific hypodense lesion in the left hepatic lobe 0.9 cm in long axis. 9. No pulmonary embolus is identified. 10. Aortic atherosclerosis. Aortic Atherosclerosis (ICD10-I70.0) and Emphysema (ICD10-J43.9). Electronically Signed   By: Gaylyn Rong M.D.   On: 02/23/2023 13:39   CT Soft Tissue Neck W Contrast  Result Date: 02/23/2023 CLINICAL DATA:  Neck mass, nonpulsatile right sided neck mass EXAM: CT NECK WITH CONTRAST TECHNIQUE: Multidetector CT imaging of the neck was performed using the standard protocol following the bolus administration of intravenous contrast. RADIATION DOSE REDUCTION: This exam was performed according to the departmental dose-optimization program which includes automated exposure control, adjustment of the mA and/or kV according to patient size and/or use of iterative reconstruction technique. CONTRAST:  75mL OMNIPAQUE IOHEXOL 350 MG/ML SOLN COMPARISON:  None Available. FINDINGS: Pharynx and larynx: Normal. No mass or swelling. Salivary glands: No inflammation, mass, or stone. Thyroid: Normal. Lymph nodes: There is bulky lower cervical and mediastinal lymphadenopathy involving the right level 3 and level 4 lymph node stations, the right supraclavicular  region, and the anterior and posterior mediastinum. Cervical lymphadenopathy significant mass effect on the right internal jugular vein (series 5, image 68). Vascular: Negative. Mass effect on the right internal jugular vein, as above Limited intracranial: Negative. Visualized orbits: Negative. Mastoids and visualized paranasal sinuses: Moderate left-sided mastoid effusion. There is pneumatization of the petrous apices with air-fluid levels. Paranasal sinuses are clear. Orbits are unremarkable. Skeleton: No acute or aggressive process. Upper chest: Mild paraseptal emphysema. See same day CT chest for additional findings Other: None. IMPRESSION: 1. Bulky right lower cervical and mediastinal lymphadenopathy, concerning for metastatic disease or lymphoma. 2.  See separate CT chest for additional findings. Emphysema (ICD10-J43.9). Electronically Signed   By: Lorenza Cambridge M.D.   On: 02/23/2023 13:17   CT Head Wo Contrast  Result Date: 02/23/2023 CLINICAL DATA:  Headache EXAM: CT HEAD WITHOUT CONTRAST TECHNIQUE: Contiguous axial images were obtained from the base of the skull through the vertex without intravenous contrast. RADIATION DOSE REDUCTION: This exam was performed according to the departmental dose-optimization program which includes automated exposure control, adjustment of the mA and/or kV according to patient size and/or use of iterative reconstruction technique. COMPARISON:  None Available. FINDINGS: Brain: There is no acute intracranial hemorrhage, extra-axial fluid collection, or acute infarct Parenchymal volume is normal. The ventricles are normal in size. Gray-white differentiation is preserved The pituitary and suprasellar region are normal. There is no mass lesion. There is no mass effect or midline shift. Vascular: No hyperdense vessel or unexpected calcification. Skull: Normal. Negative for fracture or focal lesion. Sinuses/Orbits: The imaged paranasal sinuses are clear. The globes and orbits are  unremarkable. Other: There are small bilateral mastoid effusions. IMPRESSION: 1. No acute intracranial pathology. 2. Small bilateral mastoid effusions. Electronically Signed   By: Lesia Hausen M.D.   On: 02/23/2023 13:15   DG Chest Port 1 View  Result Date: 02/23/2023 CLINICAL DATA:  Sepsis EXAM: PORTABLE CHEST 1 VIEW COMPARISON:  X-ray 01/09/2023 FINDINGS: No pneumothorax, effusion or edema. Calcified aorta. Overlapping cardiac leads. There is new widening of the mediastinum compared to prior x-ray and fullness of the right lung hilum. Recommend further evaluation with CT to delineate for potential mass. IMPRESSION: New widening of the mediastinum with masslike fullness in the right lung hilum. Recommend follow up contrast CT to further delineate Electronically Signed   By: Karen Kays M.D.   On: 02/23/2023 13:11    Scheduled Meds:  allopurinol  300 mg Oral BID   amiodarone  200 mg Oral Daily   dicyclomine  20 mg Oral Daily   fluticasone furoate-vilanterol  1 puff Inhalation Daily   methylPREDNISolone (SOLU-MEDROL) injection  60 mg Intravenous Q12H   metoprolol tartrate  25 mg Oral BID   nicotine  21 mg Transdermal Daily   Continuous Infusions:  sodium chloride 125 mL/hr at 02/25/23 0849     LOS: 2 days   Burnadette Pop, MD [facility] P6/16/2024, 11:30 AM

## 2023-02-26 ENCOUNTER — Inpatient Hospital Stay (HOSPITAL_COMMUNITY): Payer: Medicaid Other

## 2023-02-26 DIAGNOSIS — R918 Other nonspecific abnormal finding of lung field: Secondary | ICD-10-CM | POA: Diagnosis not present

## 2023-02-26 LAB — CBC
HCT: 31.2 % — ABNORMAL LOW (ref 36.0–46.0)
Hemoglobin: 10.6 g/dL — ABNORMAL LOW (ref 12.0–15.0)
MCH: 32.8 pg (ref 26.0–34.0)
MCHC: 34 g/dL (ref 30.0–36.0)
MCV: 96.6 fL (ref 80.0–100.0)
Platelets: 44 10*3/uL — ABNORMAL LOW (ref 150–400)
RBC: 3.23 MIL/uL — ABNORMAL LOW (ref 3.87–5.11)
RDW: 14.3 % (ref 11.5–15.5)
WBC: 8.1 10*3/uL (ref 4.0–10.5)
nRBC: 0.5 % — ABNORMAL HIGH (ref 0.0–0.2)

## 2023-02-26 LAB — BASIC METABOLIC PANEL
Anion gap: 14 (ref 5–15)
BUN: 17 mg/dL (ref 6–20)
CO2: 20 mmol/L — ABNORMAL LOW (ref 22–32)
Calcium: 9.4 mg/dL (ref 8.9–10.3)
Chloride: 104 mmol/L (ref 98–111)
Creatinine, Ser: 1.09 mg/dL — ABNORMAL HIGH (ref 0.44–1.00)
GFR, Estimated: >60 mL/min (ref >60)
Glucose, Bld: 132 mg/dL — ABNORMAL HIGH (ref 70–99)
Potassium: 3.8 mmol/L (ref 3.5–5.1)
Sodium: 138 mmol/L (ref 135–145)

## 2023-02-26 LAB — CULTURE, BLOOD (ROUTINE X 2): Special Requests: ADEQUATE

## 2023-02-26 LAB — LACTIC ACID, PLASMA: Lactic Acid, Venous: 2.7 mmol/L (ref 0.5–1.9)

## 2023-02-26 MED ORDER — IPRATROPIUM-ALBUTEROL 0.5-2.5 (3) MG/3ML IN SOLN
3.0000 mL | Freq: Four times a day (QID) | RESPIRATORY_TRACT | Status: DC | PRN
  Administered 2023-02-26: 3 mL via RESPIRATORY_TRACT
  Administered 2023-02-27: 6 mL via RESPIRATORY_TRACT
  Filled 2023-02-26 (×2): qty 3

## 2023-02-26 MED ORDER — LIDOCAINE HCL (PF) 1 % IJ SOLN
10.0000 mL | Freq: Once | INTRAMUSCULAR | Status: AC
  Administered 2023-02-26: 10 mL via INTRADERMAL

## 2023-02-26 MED ORDER — BUDESONIDE 0.5 MG/2ML IN SUSP
0.5000 mg | Freq: Two times a day (BID) | RESPIRATORY_TRACT | Status: DC
  Administered 2023-02-26 – 2023-03-05 (×14): 0.5 mg via RESPIRATORY_TRACT
  Filled 2023-02-26 (×18): qty 2

## 2023-02-26 MED ORDER — METHYLPREDNISOLONE SODIUM SUCC 125 MG IJ SOLR: 80.0000 mg | Freq: Every day | INTRAMUSCULAR | Status: DC

## 2023-02-26 MED ORDER — METHYLPREDNISOLONE SODIUM SUCC 40 MG IJ SOLR
40.0000 mg | Freq: Once | INTRAMUSCULAR | Status: AC
  Administered 2023-02-26: 40 mg via INTRAVENOUS
  Filled 2023-02-26: qty 1

## 2023-02-26 NOTE — Progress Notes (Signed)
She went for [place] nodal bx today.   We will do surveillance to insure that the sample was adequate for diagnosis. If not then we will help her arrange bronchoscopy.   Levy Pupa, MD, PhD 02/26/2023, 1:38 PM [facility] [phone] or if no answer before 7:00PM call [phone] For any issues after 7:00PM please call eLink [phone]

## 2023-02-26 NOTE — Progress Notes (Signed)
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  Patient was seen by her primary care physician couple of times and was given antibiotics and was also diagnosed with pneumonia but she had no wheezing, coughing.  Recently noted to have right neck mass.  On presentation,she was hypoxic and requiring supplemental oxygen.  CT chest showed right infrahilar/hilar mass that extends around the right mainstem bronchus, the right upper lobe and all the way to the thoracic inlet with bulky adenopathy.  EKG showed A-fib with RVR.  Given iv  Cardizem.  Lab work showed potassium 3.2, alkaline phosphatase of 203, lactate of 3,  Platelets of 50.  Cardiology, PCCM,oncology consulted.  S/P  IR guided cervical node biopsy on 6/17.  Assessment & Plan:  Principal Problem:   Lung mass Active Problems:   Atrial fibrillation with RVR (HCC)   Hypokalemia   Acute respiratory failure with hypoxia (HCC)   Thrombocytopenia (HCC)   COPD with acute exacerbation (HCC)   Lactic acidosis   Acute respiratory failure with hypoxia: Likely secondary to right lung mass/concomitant COPD.  On supplemental 2 L oxygen.  Not on oxygen at home.  Will continue to wean.  If we are not able to wean the oxygen, will check if she qualifies for home oxygen.  Right lung mass: Presented with shortness of breath.  Imaging showed  right infrahilar/hilar mass that extends around the right mainstem bronchus, the right upper lobe and all the way to the thoracic inlet with bulky adenopathy, likely lung cancer but could be lymphoma too.  Family history of lymphoma.  Case was also discussed with cardiothoracic surgery, not a candidate for surgery.  PCCM/oncology consulted.  Status post   cervical lymph node biopsy.  Also has elevated LDH, uric acid level LDH, uric acid elevated with concern for tumor lysis syndrome.  Elevated D-dimer.  Continue IV fluids, allopurinol. We will also do  CT abdomen/pelvis with for staging purpose  Suspected COPD: No history of COPD in the past.  Presented with wheezing, shortness of breath.  Patient is a smoker.  Started on bronchodilators.  Currently on 1 to 2 L of oxygen per minute.  She has mild wheezing on auscultation.  Continue steroid  A-fib with RVR: New onset.  Initially started on Cardizem drip, amiodarone drip.  CHA2DS2-VASc score low,doesnot   not need anticoagulation.  Patient also has severe thrombocytopenia.  TSH normal.  2D echo ordered. She converted to  normal sinus rhythm .  Amiodarone changed to oral.  Lactic acidosis: Continue gentle IV fluids  Thrombocytopenia: No bleeding.  Likely associated with malignancy/DIC. Oncology/hematology following   Hypokalemia: Resolved  Tobacco use: Counseled cessation.  Has history of 50+ pack year of smoking.         DVT prophylaxis:SCDs Start: 02/23/23 2132     Code Status: Full Code  Family Communication: Father at the bedside  Patient status:Inpatient  Patient is from :home  Anticipated discharge NF:AOZH  Estimated DC date: Likely tomorrow   Consultants: PCCM,cardiology, oncology  Procedures:none  Antimicrobials:  Anti-infectives (From admission, onward)    None       Subjective: Patient seen and the bedside today.  She just came back from cervical lymph node biopsy.  She appeared comfortable.  Still requiring 1 to 2  L of oxygen per minute.  Has mild bilateral expiratory wheezing.  Long discussion had at the bedside about her management plan with patient and her family.  She feels better today  Objective: Vitals:   02/26/23 1035 02/26/23 1050 02/26/23 1114 02/26/23 1140  BP:   109/78   Pulse: 96 97 96 97  Resp: 17 18 20 20   Temp:   98.6 F (37 C)    TempSrc:   Oral   SpO2: 91% 90% 92% 93%  Weight:      Height:        Intake/Output Summary (Last 24 hours) at 02/26/2023 1158 Last data filed at 02/26/2023 0845 Gross per 24 hour  Intake 3553.78 ml  Output --  Net 3553.78 ml   Filed Weights   02/23/23 1108  Weight: 71.2 kg    Examination:   General exam: Overall comfortable, not in distress HEENT: PERRL Respiratory system: Mild bilateral expiratory wheezing Cardiovascular system: S1 & S2 heard, RRR.  Gastrointestinal system: Abdomen is nondistended, soft and nontender. Central nervous system: Alert and oriented Extremities: No edema, no clubbing ,no cyanosis Skin: No rashes, no ulcers,no icterus     Data Reviewed: I have personally reviewed following labs and imaging studies  CBC: Recent Labs  Lab 02/23/23 1128 02/24/23 0109 02/24/23 1014 02/25/23 0142 02/26/23 0151  WBC 8.0 8.9  --  9.7 8.1  NEUTROABS 6.0  --   --   --   --   HGB 13.8 11.8*  --  11.0* 10.6*  HCT 39.9 34.2*  --  32.9* 31.2*  MCV 95.0 95.3  --  95.4 96.6  PLT 50* 44* 46* 45* 44*   Basic Metabolic Panel: Recent Labs  Lab 02/23/23 1128 02/23/23 1859 02/24/23 0109 02/25/23 0142 02/26/23 0151  NA 133* 135 134* 138 138  K 3.2* 3.4* 4.1 3.7 3.8  CL 98 103 102 104 104  CO2 20* 20* 19* 20* 20*  GLUCOSE 130* 150* 165* 142* 132*  BUN 18 15 14 14 17   CREATININE 1.18* 1.16* 1.16* 1.06* 1.09*  CALCIUM 10.1 9.5 9.6 9.6 9.4     Recent Results (from the past 240 hour(s))  Culture, blood (Routine x 2)     Status: None (Preliminary result)   Collection Time: 02/23/23 11:28 AM   Specimen: BLOOD  Result Value Ref Range Status   Specimen Description BLOOD BLOOD RIGHT ARM  Final   Special Requests   Final    BOTTLES DRAWN AEROBIC AND ANAEROBIC Blood Culture adequate volume   Culture   Final    NO GROWTH 3 DAYS Performed at [facility], [address], [place], [place] [address]    Report Status PENDING  Incomplete  Culture, blood (Routine x 2)      Status: None (Preliminary result)   Collection Time: 02/23/23 11:29 AM   Specimen: BLOOD  Result Value Ref Range Status   Specimen Description BLOOD BLOOD LEFT ARM LAC  Final   Special Requests   Final    BOTTLES DRAWN AEROBIC AND ANAEROBIC Blood Culture adequate volume   Culture   Final    NO GROWTH 3 DAYS Performed at [facility], [address] [place], [place] [address]    Report Status PENDING  Incomplete     Radiology Studies: [place] CORE BIOPSY (SOFT TISSUE)  Result Date: 02/26/2023 INDICATION: Cervical and supraclavicular adenopathy.  No diagnosis. EXAM: ULTRASOUND-GUIDED RIGHT SUPRACLAVICULAR NODAL MASS BIOPSY COMPARISON:  CT neck and chest, 09/24/2022. MEDICATIONS: None ANESTHESIA/SEDATION: Local anesthetic  was administered. COMPLICATIONS: None immediate. TECHNIQUE: Informed written consent was obtained from the patient and/or patient's representative after a discussion of the risks, benefits and alternatives to treatment. Questions regarding the procedure were encouraged and answered. Initial ultrasound scanning demonstrated RIGHT supraclavicular nodal mass. An ultrasound image was saved for documentation purposes. The procedure was planned. A timeout was performed prior to the initiation of the procedure. The operative was prepped and draped in the usual sterile fashion, and a sterile drape was applied covering the operative field. A timeout was performed prior to the initiation of the procedure. Local anesthesia was provided with 1% lidocaine with epinephrine. Under direct ultrasound guidance, an 18 gauge core needle device was utilized to obtain to obtain 3 core needle biopsies of the RIGHT supraclavicular nodal mass. The samples were placed in saline and submitted to pathology. The needle was removed and superficial hemostasis was achieved with manual compression. Post procedure scan was negative for significant hematoma. A dressing was applied. The patient tolerated the procedure  well without immediate postprocedural complication. IMPRESSION: Successful core biopsy of RIGHT supraclavicular nodal mass. Roanna Banning, MD [facility] Electronically Signed   By: Roanna Banning M.D.   On: 02/26/2023 10:15    Scheduled Meds:  allopurinol  300 mg Oral BID   amiodarone  200 mg Oral Daily   dicyclomine  20 mg Oral Daily   fluticasone furoate-vilanterol  1 puff Inhalation Daily   methylPREDNISolone (SOLU-MEDROL) injection  60 mg Intravenous Q12H   metoprolol tartrate  25 mg Oral BID   nicotine  21 mg Transdermal Daily   Continuous Infusions:  sodium chloride 125 mL/hr at 02/26/23 1021     LOS: 3 days   Burnadette Pop, MD [facility] P6/17/2024, 11:58 AM

## 2023-02-26 NOTE — Plan of Care (Signed)
?  Problem: Education: ?Goal: Knowledge of General Education information will improve ?Description: Including pain rating scale, medication(s)/side effects and non-pharmacologic comfort measures ?Outcome: Progressing ?  ?Problem: Clinical Measurements: ?Goal: Respiratory complications will improve ?Outcome: Progressing ?  ?Problem: Activity: ?Goal: Risk for activity intolerance will decrease ?Outcome: Progressing ?  ?Problem: Nutrition: ?Goal: Adequate nutrition will be maintained ?Outcome: Progressing ?  ?Problem: Coping: ?Goal: Level of anxiety will decrease ?Outcome: Progressing ?  ?Problem: Pain Managment: ?Goal: General experience of comfort will improve ?Outcome: Progressing ?  ?

## 2023-02-26 NOTE — Procedures (Signed)
Vascular and Interventional Radiology Procedure Note  Patient: Ana Curry DOB: 1971-04-09 Medical Record Number: [id number] Note Date/Time: 02/26/23 9:18 AM   Performing Physician: Roanna Banning, MD Assistant(s): None  Diagnosis: R cervical LND. No DX   Procedure: RIGHT SUPRACLAVICULAR LYMPH NODE BIOPSY  Anesthesia: Local Anesthetic Complications: None Estimated Blood Loss: Minimal Specimens: Sent for Pathology  Findings:  Successful Ultrasound-guided biopsy of R [address]. A total of 3 samples were obtained. Hemostasis of the tract was achieved using Manual Pressure.  Plan: Bed rest for 1 hours.  See detailed procedure note with images in PACS. The patient tolerated the procedure well without incident or complication and was returned to Floor Bed in stable condition.    Roanna Banning, MD [facility]   Pager. [phone] Clinic. [phone]

## 2023-02-27 ENCOUNTER — Inpatient Hospital Stay (HOSPITAL_COMMUNITY): Payer: Medicaid Other

## 2023-02-27 ENCOUNTER — Encounter (HOSPITAL_COMMUNITY): Payer: Self-pay | Admitting: Pulmonary Disease

## 2023-02-27 DIAGNOSIS — R918 Other nonspecific abnormal finding of lung field: Secondary | ICD-10-CM | POA: Diagnosis not present

## 2023-02-27 DIAGNOSIS — J90 Pleural effusion, not elsewhere classified: Secondary | ICD-10-CM

## 2023-02-27 LAB — BLOOD CULTURE ID PANEL (REFLEXED) - BCID2

## 2023-02-27 LAB — PROCALCITONIN: Procalcitonin: <0.1 ng/mL

## 2023-02-27 LAB — CULTURE, BLOOD (ROUTINE X 2): Culture: NO GROWTH

## 2023-02-27 LAB — LACTIC ACID, PLASMA: Lactic Acid, Venous: 3 mmol/L (ref 0.5–1.9)

## 2023-02-27 LAB — URIC ACID: Uric Acid, Serum: 4.1 mg/dL (ref 2.5–7.1)

## 2023-02-27 MED ORDER — IPRATROPIUM-ALBUTEROL 0.5-2.5 (3) MG/3ML IN SOLN
3.0000 mL | RESPIRATORY_TRACT | Status: DC | PRN
  Administered 2023-02-28 – 2023-03-05 (×7): 3 mL via RESPIRATORY_TRACT
  Filled 2023-02-27 (×7): qty 3

## 2023-02-27 MED ORDER — PREDNISONE 20 MG PO TABS
40.0000 mg | ORAL_TABLET | Freq: Every day | ORAL | Status: AC
  Administered 2023-02-27 – 2023-03-03 (×5): 40 mg via ORAL
  Filled 2023-02-27 (×5): qty 2

## 2023-02-27 MED ORDER — IOHEXOL 9 MG/ML PO SOLN
500.0000 mL | ORAL | Status: AC
  Administered 2023-02-27 (×2): 500 mL via ORAL

## 2023-02-27 MED ORDER — GADOBUTROL 1 MMOL/ML IV SOLN
7.0000 mL | Freq: Once | INTRAVENOUS | Status: AC | PRN
  Administered 2023-02-27: 7 mL via INTRAVENOUS

## 2023-02-27 MED ORDER — IOHEXOL 350 MG/ML SOLN
75.0000 mL | Freq: Once | INTRAVENOUS | Status: AC | PRN
  Administered 2023-02-27: 75 mL via INTRAVENOUS
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
## 2023-02-27 NOTE — Progress Notes (Signed)
PHARMACY - PHYSICIAN COMMUNICATION CRITICAL VALUE ALERT - BLOOD CULTURE IDENTIFICATION (BCID)  Ana Curry is an 52 y.o. female who presented to [facility] on 02/23/2023 with a chief complaint of respiratory failure and R lung mass.   Assessment: Blood cx from 6/14, one of 4,  aerobic bottle growing GPC, not identified on BCID. Likely contaminant.   Name of physician (or Provider) Contacted: Dr. Renford Dills  Current antibiotics: none  Changes to prescribed antibiotics recommended: None   No results found for this or any previous visit.  Alphia Moh, PharmD, BCPS, BCCP Clinical Pharmacist  Please check AMION for all [facility] Pharm[facility] phone numbers After 10:00 PM, call Main Pharmacy [phone]

## 2023-02-27 NOTE — Plan of Care (Signed)
  Problem: Education: Goal: Knowledge of General Education information will improve Description: Including pain rating scale, medication(s)/side effects and non-pharmacologic comfort measures Outcome: Progressing   Problem: Health Behavior/Discharge Planning: Goal: Ability to manage health-related needs will improve Outcome: Progressing   Problem: Clinical Measurements: Goal: Ability to maintain clinical measurements within normal limits will improve Outcome: Progressing Goal: Will remain free from infection Outcome: Progressing Goal: Diagnostic test results will improve Outcome: Progressing Goal: Respiratory complications will improve Outcome: Progressing Goal: Cardiovascular complication will be avoided Outcome: Progressing   Problem: Activity: Goal: Risk for activity intolerance will decrease Outcome: Progressing   Problem: Nutrition: Goal: Adequate nutrition will be maintained Outcome: Progressing   Problem: Coping: Goal: Level of anxiety will decrease Outcome: Progressing   Problem: Elimination: Goal: Will not experience complications related to bowel motility Outcome: Progressing Goal: Will not experience complications related to urinary retention Outcome: Progressing   Problem: Pain Managment: Goal: General experience of comfort will improve Outcome: Progressing   Problem: Safety: Goal: Ability to remain free from injury will improve Outcome: Progressing   Problem: Skin Integrity: Goal: Risk for impaired skin integrity will decrease Outcome: Progressing   Problem: Education: Goal: Knowledge of disease or condition will improve Outcome: Progressing Goal: Knowledge of the prescribed therapeutic regimen will improve Outcome: Progressing Goal: Individualized Educational Video(s) Outcome: Progressing   Problem: Activity: Goal: Ability to tolerate increased activity will improve Outcome: Progressing Goal: Will verbalize the importance of balancing  activity with adequate rest periods Outcome: Progressing   Problem: Respiratory: Goal: Ability to maintain a clear airway will improve Outcome: Progressing Goal: Levels of oxygenation will improve Outcome: Progressing Goal: Ability to maintain adequate ventilation will improve Outcome: Progressing   Problem: Education: Goal: Knowledge of disease or condition will improve Outcome: Progressing Goal: Understanding of medication regimen will improve Outcome: Progressing Goal: Individualized Educational Video(s) Outcome: Progressing   Problem: Activity: Goal: Ability to tolerate increased activity will improve Outcome: Progressing   Problem: Cardiac: Goal: Ability to achieve and maintain adequate cardiopulmonary perfusion will improve Outcome: Progressing   Problem: Health Behavior/Discharge Planning: Goal: Ability to safely manage health-related needs after discharge will improve Outcome: Progressing   

## 2023-02-27 NOTE — Progress Notes (Signed)
Patient oxygen at rest 96% on room air.  Patient oxygen during ambulation 82% on room air.  Patient recovered to 97% on 1 liter nasal cannula.  Patient currently on room air at 92%.

## 2023-02-27 NOTE — Progress Notes (Signed)
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  Patient was seen by her primary care physician couple of times and was given antibiotics and was also diagnosed with pneumonia but she had no wheezing, coughing.  Recently noted to have right neck mass.  On presentation,she was hypoxic and requiring supplemental oxygen.  CT chest showed right infrahilar/hilar mass that extends around the right mainstem bronchus, the right upper lobe and all the way to the thoracic inlet with bulky adenopathy.  EKG showed A-fib with RVR.  Given iv  Cardizem.  Lab work showed potassium 3.2, alkaline phosphatase of 203, lactate of 3,  Platelets of 50.  Cardiology, PCCM,oncology consulted.  S/P  IR guided cervical node biopsy on 6/17.  Assessment & Plan:  Principal Problem:   Lung mass Active Problems:   Atrial fibrillation with RVR (HCC)   Hypokalemia   Acute respiratory failure with hypoxia (HCC)   Thrombocytopenia (HCC)   COPD with acute exacerbation (HCC)   Lactic acidosis   Acute respiratory failure with hypoxia: Likely secondary to right lung mass/concomitant COPD.  On supplemental 2 L oxygen.  Not on oxygen at home.  She might qualify for home oxygen.  CT abdomen/pelvis done today showed bilateral lower lung collapse/consolidations, pleural effusion.  Checking chest x-ray.  If found to have significant pleural effusion, will request for thoracentesis  Right lung mass: Presented with shortness of breath.  Imaging showed  right infrahilar/hilar mass that extends around the right mainstem bronchus, the right upper lobe and all the way to the thoracic inlet with bulky adenopathy, likely lung cancer but could be lymphoma too.  Family history of  lymphoma.  Case was also discussed with cardiothoracic surgery, not a candidate for surgery.  PCCM/oncology consulted.  Status post  cervical lymph node biopsy.  Also has elevated LDH, uric acid level LDH, uric acid elevated with concern for tumor lysis syndrome.  Elevated D-dimer.  Treated with IV fluid, allopurinol CT abdomen/pelvis with for staging purpose did not show any clear metastatic disease.  Suspected COPD: No history of COPD in the past.  Presented with wheezing, shortness of breath.  Patient is a smoker.  Started on bronchodilators.  Currently on 1 to 2 L of oxygen per minute.  She has mild wheezing on auscultation.  Continue steroid, she medically fit for home oxygen  Gram-positive bacteremia: 1 set of blood culture sent on [facility] has shown gram-positive cocci in aerobic bottle.  We are repeating blood cultures.  She is not septic.  Afebrile, no leukocytosis.  Will check procalcitonin.  We have requested ID consultation.  Recent echo has not shown any vegetation  A-fib with RVR: New onset.  Initially started on Cardizem drip, amiodarone drip.  CHA2DS2-VASc score low,doesnot   not need anticoagulation.  Patient also has severe thrombocytopenia.  TSH normal.  2D echo showed normal EF, no wall motion abnormality.. She converted to  normal sinus rhythm .  Amiodarone changed to oral.  Lactic acidosis: On gentle IV fluids, I do not think her elevated lactate level is from sepsis  Thrombocytopenia: No bleeding.  Likely associated with malignancy/DIC. Oncology/hematology following   Hypokalemia: Resolved  Tobacco use: Counseled cessation.  Has history of 50+ pack year of smoking.  DVT prophylaxis:SCDs Start: 02/23/23 2132     Code Status: Full Code  Family Communication: Father at the bedside on 6/17  Patient status:Inpatient  Patient is from :home  Anticipated discharge ZO:XWRU  Estimated DC date: Likely tomorrow   Consultants: PCCM,cardiology,  oncology  Procedures:none  Antimicrobials:  Anti-infectives (From admission, onward)    None       Subjective: Send seen and examined at bedside today.  Still on 2 L of oxygen.  Denies any worsening shortness of breath or cough.  Still has wheezing.  Long discussion had at the bedside about her management plan.  We discussed about getting a CT abdomen/pelvis for staging purposes.  Since she is eager to go home.  Objective: Vitals:   02/27/23 1110 02/27/23 1200 02/27/23 1205 02/27/23 1226  BP: 126/82     Pulse: 95     Resp: 18     Temp: 97.9 F (36.6 C)     TempSrc: Oral     SpO2: 93% (!) 82% 97% 92%  Weight:      Height:        Intake/Output Summary (Last 24 hours) at 02/27/2023 1227 Last data filed at 02/27/2023 1200 Gross per 24 hour  Intake 1605 ml  Output 3 ml  Net 1602 ml   Filed Weights   02/23/23 1108  Weight: 71.2 kg    Examination:   General exam: Overall comfortable, not in distress HEENT: PERRL Respiratory system: Diminished sounds bilaterally, mild expiratory wheezing Cardiovascular system: S1 & S2 heard, RRR.  Gastrointestinal system: Abdomen is nondistended, soft and nontender. Central nervous system: Alert and oriented Extremities: No edema, no clubbing ,no cyanosis Skin: No rashes, no ulcers,no icterus     Data Reviewed: I have personally reviewed following labs and imaging studies  CBC: Recent Labs  Lab 02/23/23 1128 02/24/23 0109 02/24/23 1014 02/25/23 0142 02/26/23 0151  WBC 8.0 8.9  --  9.7 8.1  NEUTROABS 6.0  --   --   --   --   HGB 13.8 11.8*  --  11.0* 10.6*  HCT 39.9 34.2*  --  32.9* 31.2*  MCV 95.0 95.3  --  95.4 96.6  PLT 50* 44* 46* 45* 44*   Basic Metabolic Panel: Recent Labs  Lab 02/23/23 1128 02/23/23 1859 02/24/23 0109 02/25/23 0142 02/26/23 0151  NA 133* 135 134* 138 138  K 3.2* 3.4* 4.1 3.7 3.8  CL 98 103 102 104 104  CO2 20* 20* 19* 20* 20*  GLUCOSE 130* 150* 165* 142* 132*  BUN 18 15 14 14 17    CREATININE 1.18* 1.16* 1.16* 1.06* 1.09*  CALCIUM 10.1 9.5 9.6 9.6 9.4     Recent Results (from the past 240 hour(s))  Culture, blood (Routine x 2)     Status: None (Preliminary result)   Collection Time: 02/23/23 11:28 AM   Specimen: BLOOD  Result Value Ref Range Status   Specimen Description BLOOD BLOOD RIGHT ARM  Final   Special Requests   Final    BOTTLES DRAWN AEROBIC AND ANAEROBIC Blood Culture adequate volume   Culture   Final    NO GROWTH 4 DAYS Performed at [facility], [address] [place], [place] [address]    Report Status PENDING  Incomplete  Culture, blood (Routine x 2)     Status: None (Preliminary result)   Collection Time: 02/23/23 11:29 AM   Specimen: BLOOD  Result Value Ref Range Status   Specimen Description BLOOD BLOOD LEFT ARM LAC  Final   Special Requests   Final    BOTTLES DRAWN AEROBIC AND ANAEROBIC Blood Culture adequate volume   Culture  Setup Time   Final    AEROBIC BOTTLE ONLY GRAM POSITIVE COCCI Gram Stain Report Called to,Read Back By and Verified With: WEST,MIKE (Haines) @ 1105 ON 02/27/2023 BY FRATTO,ASHLEY Performed at [facility], [address] [place], [place] [address]    Culture PENDING  Incomplete   Report Status PENDING  Incomplete     Radiology Studies: CT ABDOMEN PELVIS W CONTRAST  Result Date: 02/27/2023 CLINICAL DATA:  Non-small-cell lung cancer. Staging. * Tracking Code: BO * EXAM: CT ABDOMEN AND PELVIS WITH CONTRAST TECHNIQUE: Multidetector CT imaging of the abdomen and pelvis was performed using the standard protocol following bolus administration of intravenous contrast. RADIATION DOSE REDUCTION: This exam was performed according to the departmental dose-optimization program which includes automated exposure control, adjustment of the mA and/or kV according to patient size and/or use of iterative reconstruction technique. CONTRAST:  75mL OMNIPAQUE IOHEXOL 350 MG/ML SOLN COMPARISON:  Chest CTA 02/23/2023 FINDINGS: Lower  chest: Bibasilar collapse/consolidation with bilateral pleural effusions, right greater than left. Pleural effusions are progressive since the chest CT 4 days ago. Hepatobiliary: Small area of low attenuation in the anterior liver, adjacent to the falciform ligament, is in a characteristic location for focal fatty deposition. 9 mm hypodensity in the left liver (15/3) is too small to characterize but statistically is likely benign. There is no evidence for gallstones, gallbladder wall thickening, or pericholecystic fluid. No intrahepatic or extrahepatic biliary dilation. Pancreas: No focal mass lesion. No dilatation of the main duct. No intraparenchymal cyst. No peripancreatic edema. Spleen: No splenomegaly. No suspicious focal mass lesion. Adrenals/Urinary Tract: No adrenal nodule or mass. Kidneys unremarkable. No evidence for hydroureter. The urinary bladder appears normal for the degree of distention. Stomach/Bowel: Stomach is distended with food and fluid. Duodenum is normally positioned as is the ligament of Treitz. No small bowel wall thickening. No small bowel dilatation. The terminal ileum is normal. The appendix is normal. No gross colonic mass. No colonic wall thickening. Vascular/Lymphatic: Moderate atherosclerotic calcification is noted in the wall of the aorta. There is no gastrohepatic or hepatoduodenal ligament lymphadenopathy. No retroperitoneal or mesenteric lymphadenopathy. No pelvic sidewall lymphadenopathy. Reproductive: Unremarkable. Other: No intraperitoneal free fluid. Musculoskeletal: No worrisome lytic or sclerotic osseous abnormality. IMPRESSION: 1. No definite evidence for metastatic disease in the abdomen or pelvis. 2. 9 mm hypodensity in the left liver is too small to characterize but statistically is likely benign. Attention on follow-up recommended. 3. Bibasilar collapse/consolidation with bilateral pleural effusions, right greater than left. Pleural effusions are progressive since the  chest CT 4 days ago. 4.  Aortic Atherosclerosis (ICD10-I70.0). Electronically Signed   By: Kennith Center M.D.   On: 02/27/2023 11:47   [place] CORE BIOPSY (SOFT TISSUE)  Result Date: 02/26/2023 INDICATION: Cervical and supraclavicular adenopathy.  No diagnosis. EXAM: ULTRASOUND-GUIDED RIGHT SUPRACLAVICULAR NODAL MASS BIOPSY COMPARISON:  CT neck and chest, 09/24/2022. MEDICATIONS: None ANESTHESIA/SEDATION: Local anesthetic was administered. COMPLICATIONS: None immediate. TECHNIQUE: Informed written consent was obtained from the patient and/or patient's representative after a discussion of the risks, benefits and alternatives to treatment. Questions regarding the procedure were encouraged and answered. Initial ultrasound scanning demonstrated RIGHT supraclavicular nodal mass. An ultrasound image was saved for documentation purposes. The procedure was planned. A timeout was performed prior to the initiation of the procedure. The operative was prepped and draped in the usual sterile fashion, and a  sterile drape was applied covering the operative field. A timeout was performed prior to the initiation of the procedure. Local anesthesia was provided with 1% lidocaine with epinephrine. Under direct ultrasound guidance, an 18 gauge core needle device was utilized to obtain to obtain 3 core needle biopsies of the RIGHT supraclavicular nodal mass. The samples were placed in saline and submitted to pathology. The needle was removed and superficial hemostasis was achieved with manual compression. Post procedure scan was negative for significant hematoma. A dressing was applied. The patient tolerated the procedure well without immediate postprocedural complication. IMPRESSION: Successful core biopsy of RIGHT supraclavicular nodal mass. Roanna Banning, MD [facility] Electronically Signed   By: Roanna Banning M.D.   On: 02/26/2023 10:15    Scheduled Meds:  allopurinol  300 mg  Oral BID   amiodarone  200 mg Oral Daily   budesonide (PULMICORT) nebulizer solution  0.5 mg Nebulization BID   dicyclomine  20 mg Oral Daily   fluticasone furoate-vilanterol  1 puff Inhalation Daily   metoprolol tartrate  25 mg Oral BID   nicotine  21 mg Transdermal Daily   predniSONE  40 mg Oral Q breakfast   Continuous Infusions:  sodium chloride 75 mL/hr at 02/27/23 0522     LOS: 4 days   Burnadette Pop, MD [facility] P6/18/2024, 12:27 PM

## 2023-02-27 NOTE — Plan of Care (Signed)
  Problem: Education: Goal: Knowledge of General Education information will improve Description: Including pain rating scale, medication(s)/side effects and non-pharmacologic comfort measures Outcome: Progressing   Problem: Clinical Measurements: Goal: Respiratory complications will improve Outcome: Progressing   Problem: Activity: Goal: Risk for activity intolerance will decrease Outcome: Progressing   

## 2023-02-28 ENCOUNTER — Encounter: Payer: Self-pay | Admitting: Hematology

## 2023-02-28 ENCOUNTER — Inpatient Hospital Stay (HOSPITAL_COMMUNITY): Payer: Medicaid Other

## 2023-02-28 DIAGNOSIS — C3491 Malignant neoplasm of unspecified part of right bronchus or lung: Secondary | ICD-10-CM | POA: Insufficient documentation

## 2023-02-28 DIAGNOSIS — R918 Other nonspecific abnormal finding of lung field: Secondary | ICD-10-CM | POA: Diagnosis not present

## 2023-02-28 LAB — CBC
HCT: 33.5 % — ABNORMAL LOW (ref 36.0–46.0)
Hemoglobin: 11.7 g/dL — ABNORMAL LOW (ref 12.0–15.0)
MCH: 33.1 pg (ref 26.0–34.0)
MCHC: 34.9 g/dL (ref 30.0–36.0)
MCV: 94.9 fL (ref 80.0–100.0)
Platelets: 51 10*3/uL — ABNORMAL LOW (ref 150–400)
RBC: 3.53 MIL/uL — ABNORMAL LOW (ref 3.87–5.11)
RDW: 14.2 % (ref 11.5–15.5)
WBC: 7 10*3/uL (ref 4.0–10.5)
nRBC: 6.3 % — ABNORMAL HIGH (ref 0.0–0.2)

## 2023-02-28 LAB — BASIC METABOLIC PANEL
Anion gap: 15 (ref 5–15)
BUN: 16 mg/dL (ref 6–20)
CO2: 21 mmol/L — ABNORMAL LOW (ref 22–32)
Calcium: 8.9 mg/dL (ref 8.9–10.3)
Chloride: 101 mmol/L (ref 98–111)
Creatinine, Ser: 1.04 mg/dL — ABNORMAL HIGH (ref 0.44–1.00)
GFR, Estimated: >60 mL/min (ref >60)
Glucose, Bld: 112 mg/dL — ABNORMAL HIGH (ref 70–99)
Potassium: 3.2 mmol/L — ABNORMAL LOW (ref 3.5–5.1)
Sodium: 137 mmol/L (ref 135–145)

## 2023-02-28 LAB — SURGICAL PATHOLOGY

## 2023-02-28 LAB — LACTIC ACID, PLASMA: Lactic Acid, Venous: 2.3 mmol/L (ref 0.5–1.9)

## 2023-02-28 MED ORDER — SENNOSIDES-DOCUSATE SODIUM 8.6-50 MG PO TABS
1.0000 | ORAL_TABLET | Freq: Two times a day (BID) | ORAL | Status: DC
  Administered 2023-03-03 – 2023-03-05 (×3): 1 via ORAL
  Filled 2023-02-28 (×7): qty 1

## 2023-02-28 MED ORDER — POLYETHYLENE GLYCOL 3350 17 G PO PACK
17.0000 g | PACK | Freq: Every day | ORAL | Status: DC
  Administered 2023-03-03 – 2023-03-05 (×3): 17 g via ORAL
  Filled 2023-02-28 (×3): qty 1

## 2023-02-28 MED ORDER — LIDOCAINE HCL 1 % IJ SOLN
INTRAMUSCULAR | Status: AC
  Filled 2023-02-28: qty 20

## 2023-02-28 MED ORDER — TECHNETIUM TC 99M MEDRONATE IV KIT
20.0000 | PACK | Freq: Once | INTRAVENOUS | Status: AC | PRN
  Administered 2023-02-28: 21 via INTRAVENOUS

## 2023-02-28 MED ORDER — ACETAMINOPHEN 325 MG PO TABS
650.0000 mg | ORAL_TABLET | Freq: Four times a day (QID) | ORAL | Status: DC | PRN
  Administered 2023-02-28: 650 mg via ORAL
  Filled 2023-02-28: qty 2

## 2023-02-28 MED ORDER — POTASSIUM CHLORIDE CRYS ER 20 MEQ PO TBCR
40.0000 meq | EXTENDED_RELEASE_TABLET | Freq: Once | ORAL | Status: AC
  Administered 2023-02-28: 40 meq via ORAL
  Filled 2023-02-28: qty 2

## 2023-02-28 NOTE — Progress Notes (Addendum)
Ana Curry   DOB:1971-08-12   ZO#:[id number]   JWJ#:[id number]  Oncology follow up   Subjective: Patient is clinically stable, still has dyspnea, even with speech.  She is able to walk in the hallway, appetite is decent, she has been eating.  She is quite exhausted today due to the multiple tests.  She has ecchymosis in the left elbow due to the IV needles, no other active bleeding.   Objective:  Vitals:   02/28/23 1218 02/28/23 1611  BP:  104/69  Pulse:  89  Resp: 20 (!) 22  Temp:  97.9 F (36.6 C)  SpO2: 92% (!) 89%    Body mass index is 23.87 kg/m.  Intake/Output Summary (Last 24 hours) at 02/28/2023 1744 Last data filed at 02/27/2023 2200 Gross per 24 hour  Intake 540 ml  Output --  Net 540 ml     Sclerae unicteric  Oropharynx clear  Large right low neck mass   MSK no focal spinal tenderness, no peripheral edema  Neuro nonfocal  CBG (last 3)  No results for input(s): "GLUCAP" in the last 72 hours.   Labs:  Urine Studies No results for input(s): "UHGB", "CRYS" in the last 72 hours.  Invalid input(s): "UACOL", "UAPR", "USPG", "UPH", "UTP", "UGL", "UKET", "UBIL", "UNIT", "UROB", "ULEU", "UEPI", "UWBC", "URBC", "UBAC", "CAST", "UCOM", "BILUA"  Basic Metabolic Panel: Recent Labs  Lab 02/23/23 1859 02/24/23 0109 02/25/23 0142 02/26/23 0151 02/28/23 0547  NA 135 134* 138 138 137  K 3.4* 4.1 3.7 3.8 3.2*  CL 103 102 104 104 101  CO2 20* 19* 20* 20* 21*  GLUCOSE 150* 165* 142* 132* 112*  BUN 15 14 14 17 16   CREATININE 1.16* 1.16* 1.06* 1.09* 1.04*  CALCIUM 9.5 9.6 9.6 9.4 8.9   GFR Estimated Creatinine Clearance: 63.8 mL/min (A) (by C-G formula based on SCr of 1.04 mg/dL (H)). Liver Function Tests: Recent Labs  Lab 02/23/23 1128 02/24/23 0109 02/25/23 0142  AST 58* 52* 44*  ALT 27 27 28   ALKPHOS 203* 177* 161*  BILITOT 0.9 0.6 0.7  PROT 7.3 6.3* 6.2*  ALBUMIN 4.0 3.5 3.4*   No results for input(s): "LIPASE", "AMYLASE" in the last 168 hours. No  results for input(s): "AMMONIA" in the last 168 hours. Coagulation profile Recent Labs  Lab 02/23/23 1128 02/24/23 1014  INR 1.1 1.1    CBC: Recent Labs  Lab 02/23/23 1128 02/24/23 0109 02/24/23 1014 02/25/23 0142 02/26/23 0151 02/28/23 0639  WBC 8.0 8.9  --  9.7 8.1 7.0  NEUTROABS 6.0  --   --   --   --   --   HGB 13.8 11.8*  --  11.0* 10.6* 11.7*  HCT 39.9 34.2*  --  32.9* 31.2* 33.5*  MCV 95.0 95.3  --  95.4 96.6 94.9  PLT 50* 44* 46* 45* 44* 51*   Cardiac Enzymes: No results for input(s): "CKTOTAL", "CKMB", "CKMBINDEX", "TROPONINI" in the last 168 hours. BNP: Invalid input(s): "POCBNP" CBG: No results for input(s): "GLUCAP" in the last 168 hours. D-Dimer No results for input(s): "DDIMER" in the last 72 hours. Hgb A1c No results for input(s): "HGBA1C" in the last 72 hours. Lipid Profile No results for input(s): "CHOL", "HDL", "LDLCALC", "TRIG", "CHOLHDL", "LDLDIRECT" in the last 72 hours. Thyroid function studies No results for input(s): "TSH", "T4TOTAL", "T3FREE", "THYROIDAB" in the last 72 hours.  Invalid input(s): "FREET3" Anemia work up No results for input(s): "VITAMINB12", "FOLATE", "FERRITIN", "TIBC", "IRON", "RETICCTPCT" in the last 72 hours. Microbiology  Recent Results (from the past 240 hour(s))  Culture, blood (Routine x 2)     Status: None   Collection Time: 02/23/23 11:28 AM   Specimen: BLOOD  Result Value Ref Range Status   Specimen Description BLOOD BLOOD RIGHT ARM  Final   Special Requests   Final    BOTTLES DRAWN AEROBIC AND ANAEROBIC Blood Culture adequate volume   Culture   Final    NO GROWTH 5 DAYS Performed at [facility], [address] [place], [place] [address]    Report Status 02/28/2023 FINAL  Final  Culture, blood (Routine x 2)     Status: None (Preliminary result)   Collection Time: 02/23/23 11:29 AM   Specimen: Left Antecubital; Blood  Result Value Ref Range Status   Specimen Description   Final    LEFT ANTECUBITAL  BLOOD Performed at [facility], [address], [place], [place] [address]    Special Requests   Final    BOTTLES DRAWN AEROBIC AND ANAEROBIC Blood Culture adequate volume Performed at [facility], [address] [place], [place] [address]    Culture  Setup Time   Final    AEROBIC BOTTLE ONLY GRAM POSITIVE COCCI Gram Stain Report Called to,Read Back By and Verified With: WEST,MIKE ([place]) @ 1105 ON 02/27/2023 BY FRATTO,ASHLEY CRITICAL RESULT CALLED TO, READ BACK BY AND VERIFIED WITH: PHARMD L. CHEN [id number] @ 1840 FH    Culture   Final    GRAM POSITIVE COCCI TOO YOUNG TO READ Performed at [facility], [address], [place], [place] [address]    Report Status PENDING  Incomplete  Blood Culture ID Panel (Reflexed)     Status: None   Collection Time: 02/23/23 11:29 AM  Result Value Ref Range Status   Enterococcus faecalis NOT DETECTED NOT DETECTED Final   Enterococcus Faecium NOT DETECTED NOT DETECTED Final   Listeria monocytogenes NOT DETECTED NOT DETECTED Final   Staphylococcus species NOT DETECTED NOT DETECTED Final   Staphylococcus aureus (BCID) NOT DETECTED NOT DETECTED Final   Staphylococcus epidermidis NOT DETECTED NOT DETECTED Final   Staphylococcus lugdunensis NOT DETECTED NOT DETECTED Final   Streptococcus species NOT DETECTED NOT DETECTED Final   Streptococcus agalactiae NOT DETECTED NOT DETECTED Final   Streptococcus pneumoniae NOT DETECTED NOT DETECTED Final   Streptococcus pyogenes NOT DETECTED NOT DETECTED Final   A.calcoaceticus-baumannii NOT DETECTED NOT DETECTED Final   Bacteroides fragilis NOT DETECTED NOT DETECTED Final   Enterobacterales NOT DETECTED NOT DETECTED Final   Enterobacter cloacae complex NOT DETECTED NOT DETECTED Final   Escherichia coli NOT DETECTED NOT DETECTED Final   Klebsiella aerogenes NOT DETECTED NOT DETECTED Final   Klebsiella oxytoca NOT DETECTED NOT DETECTED Final   Klebsiella pneumoniae NOT DETECTED NOT DETECTED  Final   Proteus species NOT DETECTED NOT DETECTED Final   Salmonella species NOT DETECTED NOT DETECTED Final   Serratia marcescens NOT DETECTED NOT DETECTED Final   Haemophilus influenzae NOT DETECTED NOT DETECTED Final   Neisseria meningitidis NOT DETECTED NOT DETECTED Final   Pseudomonas aeruginosa NOT DETECTED NOT DETECTED Final   Stenotrophomonas maltophilia NOT DETECTED NOT DETECTED Final   Candida albicans NOT DETECTED NOT DETECTED Final   Candida auris NOT DETECTED NOT DETECTED Final   Candida glabrata NOT DETECTED NOT DETECTED Final   Candida krusei NOT DETECTED NOT DETECTED Final   Candida parapsilosis NOT DETECTED NOT DETECTED Final   Candida tropicalis NOT DETECTED NOT DETECTED Final   Cryptococcus neoformans/gattii  NOT DETECTED NOT DETECTED Final    Comment: Performed at [facility], [address] [place], [place] [address]  Culture, blood (Routine X 2) w Reflex to ID Panel     Status: None (Preliminary result)   Collection Time: 02/27/23 12:52 PM   Specimen: BLOOD RIGHT HAND  Result Value Ref Range Status   Specimen Description BLOOD RIGHT HAND  Final   Special Requests   Final    BOTTLES DRAWN AEROBIC AND ANAEROBIC Blood Culture adequate volume   Culture   Final    NO GROWTH < 24 HOURS Performed at [facility], [address] [place], [place] [address]    Report Status PENDING  Incomplete  Culture, blood (Routine X 2) w Reflex to ID Panel     Status: None (Preliminary result)   Collection Time: 02/27/23 12:53 PM   Specimen: BLOOD  Result Value Ref Range Status   Specimen Description BLOOD SITE NOT SPECIFIED  Final   Special Requests   Final    BOTTLES DRAWN AEROBIC AND ANAEROBIC Blood Culture adequate volume   Culture   Final    NO GROWTH < 24 HOURS Performed at [facility], [address] [place], [place] [address]    Report Status PENDING  Incomplete      Studies:  IR US CHEST  Result Date: 02/28/2023 CLINICAL DATA:  Evaluate for  thoracentesis. EXAM: CHEST ULTRASOUND COMPARISON:  Chest radiograph 02/28/2023 FINDINGS: Small amount of right pleural fluid was identified. No percutaneous window for thoracentesis due to a flap of lung. Thoracentesis not performed. IMPRESSION: Small right pleural effusion. Thoracentesis not performed due to lack of a safe percutaneous window. Electronically Signed   By: Richarda Overlie M.D.   On: 02/28/2023 16:10   NM Bone Scan Whole Body  Result Date: 02/28/2023 CLINICAL DATA:  Lung cancer EXAM: NUCLEAR MEDICINE WHOLE BODY BONE SCAN TECHNIQUE: Whole body anterior and posterior images were obtained approximately 3 hours after intravenous injection of radiopharmaceutical. RADIOPHARMACEUTICALS:  21.0 mCi Technetium-2m MDP IV COMPARISON:  CT scan earlier June 2024 of multiple areas. FINDINGS: Physiologic distribution of radiotracer overall. There is some uptake involving the right midfoot consistent with known provided history of injury in the past. There is slight asymmetric uptake along the proximal left tibia from the other side. There is also some subtle areas of low uptake along the distal femoral metaphysis. Recommend dedicated x-rays. Mild areas of degenerative uptake such as along the extremities. Of note the kidneys are not as well seen as typical. Please correlate with clinical findings. IMPRESSION: Subtle asymmetric uptake involving the proximal left tibia. Slight low uptake along the distal femurs. Recommend dedicated x-rays if there is no known history. Atypical low uptake of the renal parenchyma. The rest of the findings do not suggest super scan but please correlate with the patient's renal function and other history. This may be technical. Electronically Signed   By: Karen Kays M.D.   On: 02/28/2023 14:40   DG CHEST PORT 1 VIEW  Result Date: 02/28/2023 CLINICAL DATA:  Pleural effusion on right. EXAM: PORTABLE CHEST 1 VIEW COMPARISON:  Radiographs 02/27/2023 and 02/23/2023. Abdominal CT  02/27/2023 and chest CT 02/23/2023. FINDINGS: 0845 hours. Mild patient rotation to the right. Grossly stable heart size and mediastinal contours with known extensive mediastinal lymphadenopathy. There are enlarging right greater than left pleural effusions with increasing atelectasis at both lung bases. No pneumothorax. The bones appear unchanged. Telemetry leads overlie the chest. IMPRESSION:  1. Enlarging right greater than left pleural effusions with increasing bibasilar atelectasis. 2. Grossly stable mediastinal lymphadenopathy from recent chest CT, presumed lung cancer. Electronically Signed   By: Carey Bullocks M.D.   On: 02/28/2023 12:55   MR BRAIN W WO CONTRAST  Result Date: 02/28/2023 CLINICAL DATA:  Staging of small cell lung carcinoma EXAM: MRI HEAD WITHOUT AND WITH CONTRAST TECHNIQUE: Multiplanar, multiecho pulse sequences of the brain and surrounding structures were obtained without and with intravenous contrast. CONTRAST:  7mL GADAVIST GADOBUTROL 1 MMOL/ML IV SOLN COMPARISON:  None Available. FINDINGS: Brain: No acute infarct, mass effect or extra-axial collection. No acute or chronic hemorrhage. Normal white matter signal. On diffusion-weighted imaging and the FLAIR sequence, there are bilateral convexity signal abnormalities within the subdural space. The midline structures are normal. There is smooth, diffuse pachymeningeal contrast enhancement. There are no intraparenchymal contrast enhancing lesions. Vascular: Major flow voids are preserved. Skull and upper cervical spine: Normal calvarium and skull base. Visualized upper cervical spine and soft tissues are normal. Sinuses/Orbits:Mastoid effusions. Paranasal sinuses are clear. Normal orbits. IMPRESSION: 1. No intraparenchymal metastatic disease. 2. Bilateral convexity signal abnormalities within the subdural space, which may indicate aging subdural blood. 3. Smooth, diffuse pachymeningeal contrast enhancement. 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 Metastatic disease felt less likely given the lack nodularity. Electronically Signed   By: Deatra Robinson M.D.   On: 02/28/2023 02:17   DG CHEST PORT 1 VIEW  Result Date: 02/27/2023 CLINICAL DATA:  Shortness of breath. EXAM: PORTABLE CHEST 1 VIEW COMPARISON:  February 23, 2023. FINDINGS: Stable cardiomegaly. Mild central pulmonary vascular congestion is noted. Minimal bibasilar pulmonary edema is noted with small right pleural effusion. Bony thorax is unremarkable. IMPRESSION: Stable cardiomegaly with mild central pulmonary vascular congestion. Minimal bibasilar pulmonary edema is noted with probable small right pleural effusion. Electronically Signed   By: Lupita Raider M.D.   On: 02/27/2023 16:27   CT ABDOMEN PELVIS W CONTRAST  Result Date: 02/27/2023 CLINICAL DATA:  Non-small-cell lung cancer. Staging. * Tracking Code: BO * EXAM: CT ABDOMEN AND PELVIS WITH CONTRAST TECHNIQUE: Multidetector CT imaging of the abdomen and pelvis was performed using the standard protocol following bolus administration of intravenous contrast. RADIATION DOSE REDUCTION: This exam was performed according to the departmental dose-optimization program which includes automated exposure control, adjustment of the mA and/or kV according to patient size and/or use of iterative reconstruction technique. CONTRAST:  75mL OMNIPAQUE IOHEXOL 350 MG/ML SOLN COMPARISON:  Chest CTA 02/23/2023 FINDINGS: Lower chest: Bibasilar collapse/consolidation with bilateral pleural effusions, right greater than left. Pleural effusions are progressive since the chest CT 4 days ago. Hepatobiliary: Small area of low attenuation in the anterior liver, adjacent to the falciform ligament, is in a characteristic location for focal fatty deposition. 9 mm hypodensity in the left liver (15/3) is too small to characterize but statistically is  likely benign. There is no evidence for gallstones, gallbladder wall thickening, or pericholecystic fluid. No intrahepatic or extrahepatic biliary dilation. Pancreas: No focal mass lesion. No dilatation of the main duct. No intraparenchymal cyst. No peripancreatic edema. Spleen: No splenomegaly. No suspicious focal mass lesion. Adrenals/Urinary Tract: No adrenal nodule or mass. Kidneys unremarkable. No evidence for hydroureter. The urinary bladder appears normal for the degree of distention. Stomach/Bowel: Stomach is distended with food and fluid. Duodenum is normally positioned as is the ligament of Treitz. No small bowel wall  thickening. No small bowel dilatation. The terminal ileum is normal. The appendix is normal. No gross colonic mass. No colonic wall thickening. Vascular/Lymphatic: Moderate atherosclerotic calcification is noted in the wall of the aorta. There is no gastrohepatic or hepatoduodenal ligament lymphadenopathy. No retroperitoneal or mesenteric lymphadenopathy. No pelvic sidewall lymphadenopathy. Reproductive: Unremarkable. Other: No intraperitoneal free fluid. Musculoskeletal: No worrisome lytic or sclerotic osseous abnormality. IMPRESSION: 1. No definite evidence for metastatic disease in the abdomen or pelvis. 2. 9 mm hypodensity in the left liver is too small to characterize but statistically is likely benign. Attention on follow-up recommended. 3. Bibasilar collapse/consolidation with bilateral pleural effusions, right greater than left. Pleural effusions are progressive since the chest CT 4 days ago. 4.  Aortic Atherosclerosis (ICD10-I70.0). Electronically Signed   By: Kennith Center M.D.   On: 02/27/2023 11:47    Assessment: 52 y.o.  Right small cell lung cancer, stage IV with cervical node metastasis, indeterminate right pleural effusion  moderate thrombocytopenia, likely DIC vs bone marrow involvement by malignancy Tumor lysis syndrome, resolved now  Right pleural effusion      Plan:  -I discussed her cervical lymph node biopsy results with patient in detail, it confirms small cell carcinoma, from her right lung.  She has distal nodal metastasis to neck, questionable malignant right pleural effusion.  Due to her moderate thrombocytopenia, bone marrow involvement also need to be ruled out. -I reviewed her brain MRI findings, which was negative for metastasis or acute findings.  Her bone scan showed nonspecific uptake in the left lower extremity, no definitive evidence of bone metastasis. -I recommend a bone marrow biopsy to rule out bone metastasis, also workup for her thrombocytopenia -Right thoracentesis was attempted, not able to do due to the limited fluid -I discussed the treatment options with patient.  I recommend first-line chemotherapy with carboplatin, etoposide and atezolizumab, every 3 weeks for 4 cycles, followed by atezolizumab every 3 weeks until disease progression.  Benefit and potential side effects were discussed with her in details, she agrees to proceed. -I recommend her to start chemotherapy as soon as possible, I gave her the option of start chemo in the hospital in next few days, versus discharge her home first then start chemo in office next Monday.  She prefers to go home first, then start chemo next Monday in office. -She would likely benefit from radiation, due to the significant narrowing of right-sided bronchus, and large adenopathy around SVC, which potentially will cause SVC syndrome.  If her bone marrow biopsy is negative, we will plan to give her definitive dose concurrent chemoradiation. -I will reach out to radiation oncologist, to get their opinion. -I will also reach out to Dr. Ellin Saba in [place] to see her next Monday, which is closer to her home -She lives alone, does have some support from her parents and sister who live very close to her. -She would like to apply for disability, I encouraged her to talk to Child psychotherapist. -She also  need a port placement for chemo, I will request IR to do before discharge if possible.   I spent a total of 50 mins for her chemo today.  Malachy Mood, MD 02/27/2023

## 2023-02-28 NOTE — Progress Notes (Signed)
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  Patient was seen by her primary care physician couple of times and was given antibiotics and was also diagnosed with pneumonia but she had no wheezing, coughing.  Recently noted to have right neck mass.  On presentation,she was hypoxic and requiring supplemental oxygen.  CT chest showed right infrahilar/hilar mass that extends around the right mainstem bronchus, the right upper lobe and all the way to the thoracic inlet with bulky adenopathy.  EKG showed A-fib with RVR.  Given iv  Cardizem.  Lab work showed potassium 3.2, alkaline phosphatase of 203, lactate of 3,  Platelets of 50.  Cardiology, PCCM,oncology consulted.  S/P  IR guided cervical node biopsy on 6/17.  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  Workup in progress  Assessment & Plan:  Principal Problem:   Lung mass Active Problems:   Atrial fibrillation with RVR (HCC)   Hypokalemia   Acute respiratory failure with hypoxia (HCC)   Thrombocytopenia (HCC)   COPD with acute exacerbation (HCC)   Lactic acidosis   Acute respiratory failure with hypoxia: Likely secondary to right lung mass/concomitant COPD/pleural effusion.  On supplemental 2 L oxygen.  Not on oxygen at home.  She might qualify for home oxygen.  CT abdomen/pelvis done showed bilateral lower lung collapse/consolidations, pleural effusion.  CXR showed bilateral pleural effusion more than right. We will request for US thoracentesis  Right lung mass: Presented with shortness of breath.  Imaging showed  right infrahilar/hilar mass that extends around the right mainstem bronchus, the right upper lobe and all the way to the thoracic inlet with bulky  adenopathy, likely lung cancer but could be lymphoma too.  Family history of lymphoma.  Case was also discussed with cardiothoracic surgery, not a candidate for surgery.  PCCM/oncology consulted.  Status post  cervical lymph node biopsy.  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  Also has elevated LDH, uric acid level LDH, uric acid elevated with concern for tumor lysis syndrome.  Elevated D-dimer.  Treated with IV fluid, allopurinol CT abdomen/pelvis with for staging purpose did not show any clear metastatic disease. MRI of the brain did not show any metastatic disease.  Plan for bone scan today  Suspected COPD: No history of COPD in the past.  Presented with wheezing, shortness of breath.  Patient is a smoker.  Started on bronchodilators.  Currently on 1 to 2 L of oxygen per minute.  She has mild wheezing on auscultation. She might need home oxygen  Gram-positive bacteremia: 1 set of blood culture sent on [facility] has shown gram-positive cocci in aerobic bottle.  We are repeating blood cultures.  She is not septic.  Afebrile, no leukocytosis. Normal procalcitonin.    Recent echo has not shown any vegetation.  Repeat blood cultures have not shown any growth.  This is most likely contamination ,continue to hold antibiotics  A-fib with RVR: New onset.  Initially started on Cardizem drip, amiodarone drip.  CHA2DS2-VASc score low,doesnot   not need anticoagulation.  Patient also has severe thrombocytopenia.  TSH normal.  2D echo showed normal EF, no wall motion abnormality.. She converted to  normal sinus rhythm .  Amiodarone changed to oral.  Lactic  acidosis: On gentle IV fluids, I do not think her elevated lactate level is from sepsis  Thrombocytopenia: No bleeding.  Likely associated with malignancy/DIC. Oncology/hematology following   Hypokalemia: Supplemented  Tobacco use: Counseled cessation.  Has history of 50+ pack year of smoking.          DVT prophylaxis:SCDs Start: 02/23/23 2132     Code Status: Full Code  Family Communication: Father at the bedside on 6/17  Patient status:Inpatient  Patient is from :home  Anticipated discharge ZH:YQMV  Estimated DC date: Likely tomorrow   Consultants: PCCM,cardiology, oncology  Procedures: Cervical lymph node biopsy  Antimicrobials:  Anti-infectives (From admission, onward)    None       Subjective: Patient seen and examined at bedside today.  She was on 2L of oxygen.  Having some cough.  Noted to be having rhonchi bilaterally today.  Appears little short of breath, mild tachypnea.  Eager to go home  Objective: Vitals:   02/28/23 0850 02/28/23 0853 02/28/23 1146 02/28/23 1218  BP:   99/69   Pulse:   88   Resp:   (!) 22 20  Temp:   99.3 F (37.4 C)   TempSrc:   Oral   SpO2: 96% 92% 91% 92%  Weight:      Height:        Intake/Output Summary (Last 24 hours) at 02/28/2023 1318 Last data filed at 02/27/2023 2200 Gross per 24 hour  Intake 1215 ml  Output 2 ml  Net 1213 ml   Filed Weights   02/23/23 1108  Weight: 71.2 kg    Examination:   General exam: Overall comfortable, not in distress HEENT: PERRL Respiratory system: Diminished sounds bilaterally, bilateral rhonchi, wheezing Cardiovascular system: S1 & S2 heard, RRR.  Gastrointestinal system: Abdomen is nondistended, soft and nontender. Central nervous system: Alert and oriented Extremities: No edema, no clubbing ,no cyanosis Skin: No rashes, no ulcers,no icterus     Data Reviewed: I have personally reviewed following labs and imaging studies  CBC: Recent Labs  Lab 02/23/23 1128 02/24/23 0109 02/24/23 1014 02/25/23 0142 02/26/23 0151 02/28/23 0639  WBC 8.0 8.9  --  9.7 8.1 7.0  NEUTROABS 6.0  --   --   --   --   --   HGB 13.8 11.8*  --  11.0* 10.6* 11.7*  HCT 39.9 34.2*  --  32.9* 31.2* 33.5*  MCV 95.0 95.3  --  95.4 96.6 94.9  PLT 50* 44* 46* 45* 44* 51*   Basic Metabolic  Panel: Recent Labs  Lab 02/23/23 1859 02/24/23 0109 02/25/23 0142 02/26/23 0151 02/28/23 0547  NA 135 134* 138 138 137  K 3.4* 4.1 3.7 3.8 3.2*  CL 103 102 104 104 101  CO2 20* 19* 20* 20* 21*  GLUCOSE 150* 165* 142* 132* 112*  BUN 15 14 14 17 16   CREATININE 1.16* 1.16* 1.06* 1.09* 1.04*  CALCIUM 9.5 9.6 9.6 9.4 8.9     Recent Results (from the past 240 hour(s))  Culture, blood (Routine x 2)     Status: None   Collection Time: 02/23/23 11:28 AM   Specimen: BLOOD  Result Value Ref Range Status   Specimen Description BLOOD BLOOD RIGHT ARM  Final   Special Requests   Final    BOTTLES DRAWN AEROBIC AND ANAEROBIC Blood Culture adequate volume   Culture   Final    NO GROWTH 5 DAYS Performed at [facility], [address] [place], [place] [address]  Report Status 02/28/2023 FINAL  Final  Culture, blood (Routine x 2)     Status: None (Preliminary result)   Collection Time: 02/23/23 11:29 AM   Specimen: Left Antecubital; Blood  Result Value Ref Range Status   Specimen Description   Final    LEFT ANTECUBITAL BLOOD Performed at [facility], [address], [place], [place] [address]    Special Requests   Final    BOTTLES DRAWN AEROBIC AND ANAEROBIC Blood Culture adequate volume Performed at [facility], [address] [place], [place] [address]    Culture  Setup Time   Final    AEROBIC BOTTLE ONLY GRAM POSITIVE COCCI Gram Stain Report Called to,Read Back By and Verified With: WEST,MIKE ([place]) @ 1105 ON 02/27/2023 BY FRATTO,ASHLEY CRITICAL RESULT CALLED TO, READ BACK BY AND VERIFIED WITH: PHARMD L. CHEN [id number] @ 1840 FH    Culture   Final    GRAM POSITIVE COCCI TOO YOUNG TO READ Performed at [facility], [address], [place], [place] [address]    Report Status PENDING  Incomplete  Blood Culture ID Panel (Reflexed)     Status: None   Collection Time: 02/23/23 11:29 AM  Result Value Ref Range Status   Enterococcus faecalis NOT DETECTED NOT  DETECTED Final   Enterococcus Faecium NOT DETECTED NOT DETECTED Final   Listeria monocytogenes NOT DETECTED NOT DETECTED Final   Staphylococcus species NOT DETECTED NOT DETECTED Final   Staphylococcus aureus (BCID) NOT DETECTED NOT DETECTED Final   Staphylococcus epidermidis NOT DETECTED NOT DETECTED Final   Staphylococcus lugdunensis NOT DETECTED NOT DETECTED Final   Streptococcus species NOT DETECTED NOT DETECTED Final   Streptococcus agalactiae NOT DETECTED NOT DETECTED Final   Streptococcus pneumoniae NOT DETECTED NOT DETECTED Final   Streptococcus pyogenes NOT DETECTED NOT DETECTED Final   A.calcoaceticus-baumannii NOT DETECTED NOT DETECTED Final   Bacteroides fragilis NOT DETECTED NOT DETECTED Final   Enterobacterales NOT DETECTED NOT DETECTED Final   Enterobacter cloacae complex NOT DETECTED NOT DETECTED Final   Escherichia coli NOT DETECTED NOT DETECTED Final   Klebsiella aerogenes NOT DETECTED NOT DETECTED Final   Klebsiella oxytoca NOT DETECTED NOT DETECTED Final   Klebsiella pneumoniae NOT DETECTED NOT DETECTED Final   Proteus species NOT DETECTED NOT DETECTED Final   Salmonella species NOT DETECTED NOT DETECTED Final   Serratia marcescens NOT DETECTED NOT DETECTED Final   Haemophilus influenzae NOT DETECTED NOT DETECTED Final   Neisseria meningitidis NOT DETECTED NOT DETECTED Final   Pseudomonas aeruginosa NOT DETECTED NOT DETECTED Final   Stenotrophomonas maltophilia NOT DETECTED NOT DETECTED Final   Candida albicans NOT DETECTED NOT DETECTED Final   Candida auris NOT DETECTED NOT DETECTED Final   Candida glabrata NOT DETECTED NOT DETECTED Final   Candida krusei NOT DETECTED NOT DETECTED Final   Candida parapsilosis NOT DETECTED NOT DETECTED Final   Candida tropicalis NOT DETECTED NOT DETECTED Final   Cryptococcus neoformans/gattii NOT DETECTED NOT DETECTED Final    Comment: Performed at [facility], [address] [place], [place] [address]  Culture, blood  (Routine X 2) w Reflex to ID Panel     Status: None (Preliminary result)   Collection Time: 02/27/23 12:52 PM   Specimen: BLOOD RIGHT HAND  Result Value Ref Range Status   Specimen Description BLOOD RIGHT HAND  Final   Special Requests   Final    BOTTLES DRAWN AEROBIC AND ANAEROBIC Blood Culture adequate volume   Culture  Final    NO GROWTH < 24 HOURS Performed at [facility], [address], [place], [place] [address]    Report Status PENDING  Incomplete  Culture, blood (Routine X 2) w Reflex to ID Panel     Status: None (Preliminary result)   Collection Time: 02/27/23 12:53 PM   Specimen: BLOOD  Result Value Ref Range Status   Specimen Description BLOOD SITE NOT SPECIFIED  Final   Special Requests   Final    BOTTLES DRAWN AEROBIC AND ANAEROBIC Blood Culture adequate volume   Culture   Final    NO GROWTH < 24 HOURS Performed at [facility], [address], [place], [place] [address]    Report Status PENDING  Incomplete     Radiology Studies: DG CHEST PORT 1 VIEW  Result Date: 02/28/2023 CLINICAL DATA:  Pleural effusion on right. EXAM: PORTABLE CHEST 1 VIEW COMPARISON:  Radiographs 02/27/2023 and 02/23/2023. Abdominal CT 02/27/2023 and chest CT 02/23/2023. FINDINGS: 0845 hours. Mild patient rotation to the right. Grossly stable heart size and mediastinal contours with known extensive mediastinal lymphadenopathy. There are enlarging right greater than left pleural effusions with increasing atelectasis at both lung bases. No pneumothorax. The bones appear unchanged. Telemetry leads overlie the chest. IMPRESSION: 1. Enlarging right greater than left pleural effusions with increasing bibasilar atelectasis. 2. Grossly stable mediastinal lymphadenopathy from recent chest CT, presumed lung cancer. Electronically Signed   By: Carey Bullocks M.D.   On: 02/28/2023 12:55   MR BRAIN W WO CONTRAST  Result Date: 02/28/2023 CLINICAL DATA:  Staging of small cell lung carcinoma EXAM:  MRI HEAD WITHOUT AND WITH CONTRAST TECHNIQUE: Multiplanar, multiecho pulse sequences of the brain and surrounding structures were obtained without and with intravenous contrast. CONTRAST:  7mL GADAVIST GADOBUTROL 1 MMOL/ML IV SOLN COMPARISON:  None Available. FINDINGS: Brain: No acute infarct, mass effect or extra-axial collection. No acute or chronic hemorrhage. Normal white matter signal. On diffusion-weighted imaging and the FLAIR sequence, there are bilateral convexity signal abnormalities within the subdural space. The midline structures are normal. There is smooth, diffuse pachymeningeal contrast enhancement. There are no intraparenchymal contrast enhancing lesions. Vascular: Major flow voids are preserved. Skull and upper cervical spine: Normal calvarium and skull base. Visualized upper cervical spine and soft tissues are normal. Sinuses/Orbits:Mastoid effusions. Paranasal sinuses are clear. Normal orbits. IMPRESSION: 1. No intraparenchymal metastatic disease. 2. Bilateral convexity signal abnormalities within the subdural space, which may indicate aging subdural blood. 3. Smooth, diffuse pachymeningeal contrast enhancement. 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 Metastatic disease felt less likely given the lack nodularity. Electronically Signed   By: Deatra Robinson M.D.   On: 02/28/2023 02:17   DG CHEST PORT 1 VIEW  Result Date: 02/27/2023 CLINICAL DATA:  Shortness of breath. EXAM: PORTABLE CHEST 1 VIEW COMPARISON:  February 23, 2023. FINDINGS: Stable cardiomegaly. Mild central pulmonary vascular congestion is noted. Minimal bibasilar pulmonary edema is noted with small right pleural effusion. Bony thorax is unremarkable. IMPRESSION: Stable cardiomegaly with mild central pulmonary vascular congestion. Minimal bibasilar pulmonary edema is noted with probable small right pleural effusion.  Electronically Signed   By: Lupita Raider M.D.   On: 02/27/2023 16:27   CT ABDOMEN PELVIS W CONTRAST  Result Date: 02/27/2023 CLINICAL DATA:  Non-small-cell lung cancer. Staging. * Tracking Code: BO * EXAM: CT ABDOMEN AND PELVIS WITH CONTRAST TECHNIQUE: Multidetector CT  imaging of the abdomen and pelvis was performed using the standard protocol following bolus administration of intravenous contrast. RADIATION DOSE REDUCTION: This exam was performed according to the departmental dose-optimization program which includes automated exposure control, adjustment of the mA and/or kV according to patient size and/or use of iterative reconstruction technique. CONTRAST:  75mL OMNIPAQUE IOHEXOL 350 MG/ML SOLN COMPARISON:  Chest CTA 02/23/2023 FINDINGS: Lower chest: Bibasilar collapse/consolidation with bilateral pleural effusions, right greater than left. Pleural effusions are progressive since the chest CT 4 days ago. Hepatobiliary: Small area of low attenuation in the anterior liver, adjacent to the falciform ligament, is in a characteristic location for focal fatty deposition. 9 mm hypodensity in the left liver (15/3) is too small to characterize but statistically is likely benign. There is no evidence for gallstones, gallbladder wall thickening, or pericholecystic fluid. No intrahepatic or extrahepatic biliary dilation. Pancreas: No focal mass lesion. No dilatation of the main duct. No intraparenchymal cyst. No peripancreatic edema. Spleen: No splenomegaly. No suspicious focal mass lesion. Adrenals/Urinary Tract: No adrenal nodule or mass. Kidneys unremarkable. No evidence for hydroureter. The urinary bladder appears normal for the degree of distention. Stomach/Bowel: Stomach is distended with food and fluid. Duodenum is normally positioned as is the ligament of Treitz. No small bowel wall thickening. No small bowel dilatation. The terminal ileum is normal. The appendix is normal. No gross colonic mass. No colonic  wall thickening. Vascular/Lymphatic: Moderate atherosclerotic calcification is noted in the wall of the aorta. There is no gastrohepatic or hepatoduodenal ligament lymphadenopathy. No retroperitoneal or mesenteric lymphadenopathy. No pelvic sidewall lymphadenopathy. Reproductive: Unremarkable. Other: No intraperitoneal free fluid. Musculoskeletal: No worrisome lytic or sclerotic osseous abnormality. IMPRESSION: 1. No definite evidence for metastatic disease in the abdomen or pelvis. 2. 9 mm hypodensity in the left liver is too small to characterize but statistically is likely benign. Attention on follow-up recommended. 3. Bibasilar collapse/consolidation with bilateral pleural effusions, right greater than left. Pleural effusions are progressive since the chest CT 4 days ago. 4.  Aortic Atherosclerosis (ICD10-I70.0). Electronically Signed   By: Kennith Center M.D.   On: 02/27/2023 11:47    Scheduled Meds:  allopurinol  300 mg Oral BID   amiodarone  200 mg Oral Daily   budesonide (PULMICORT) nebulizer solution  0.5 mg Nebulization BID   dicyclomine  20 mg Oral Daily   fluticasone furoate-vilanterol  1 puff Inhalation Daily   metoprolol tartrate  25 mg Oral BID   nicotine  21 mg Transdermal Daily   polyethylene glycol  17 g Oral Daily   predniSONE  40 mg Oral Q breakfast   senna-docusate  1 tablet Oral BID   Continuous Infusions:     LOS: 5 days   Burnadette Pop, MD [facility] P6/19/2024, 1:18 PM

## 2023-02-28 NOTE — Progress Notes (Signed)
Interventional Radiology Brief Note:  Patient brought to IR for possible thoracentesis.  Limited US Chest shows a small, thin pocket of fluid with lung flap visible, not amenable to thoracentesis.  No procedure performed.  Imaging saved for review.   Patient returned to unit.   Loyce Dys, MS RD PA-C 3:00 PM

## 2023-02-28 NOTE — TOC Initial Note (Signed)
Transition of Care (TOC) - Initial/Assessment Note  Donn Pierini RN, BSN Transitions of Care Unit 4E- RN Case Manager See Treatment Team for direct phone #   Patient Details  Name: Ana Curry MRN: [id number] Date of Birth: 1970-10-04  Transition of Care [facility]) CM/SW Contact:    Darrold Span, RN Phone Number: 02/28/2023, 4:10 PM  Clinical Narrative:                 Notified pt would need home 02, Orders placed. Per MD not medically ready for transition home today.   CM spoke with pt and father at the bedside.  Discussed DME- home 02 needs and offered choice- pt voiced she does not have a preference in provider as long as they are in-network with her insurance.  Pt also voiced she would like [facility] for home.  Pt states she has RW, Cane, shower chair and crutches at home. Family will transport home when medically ready.   Address, phone # and PCP all confirmed in epic.   Call made to [employer] for home 02 and [facility] needs- per pt [facility]SC can be delivered to home w/ 02 if needed. [employer] to process and deliver portable 02 here to bedside prior to discharge.   TOC to follow for any further needs.   Expected Discharge Plan: Home/Self Care Barriers to Discharge: Continued Medical Work up   Patient Goals and CMS Choice Patient states their goals for this hospitalization and ongoing recovery are:: return home to "puppy" CMS Medicare.gov Compare Post Acute Care list provided to:: Patient Choice offered to / list presented to : Patient      Expected Discharge Plan and Services   Discharge Planning Services: CM Consult Post Acute Care Choice: Durable Medical Equipment Living arrangements for the past 2 months: Single Family Home                 DME Arranged: Bedside commode, Oxygen DME Agency: [facility] Date DME Agency Contacted: 02/28/23 Time DME Agency Contacted: 1610 Representative spoke with at DME Agency: Zollie Beckers HH Arranged: NA HH Agency: NA        Prior  Living Arrangements/Services Living arrangements for the past 2 months: Single Family Home Lives with:: Self Patient language and need for interpreter reviewed:: Yes Do you feel safe going back to the place where you live?: Yes      Need for Family Participation in Patient Care: Yes (Comment) Care giver support system in place?: Yes (comment)   Criminal Activity/Legal Involvement Pertinent to Current Situation/Hospitalization: No - Comment as needed  Activities of Daily Living      Permission Sought/Granted Permission sought to share information with : Facility Industrial/product designer granted to share information with : Yes, Verbal Permission Granted     Permission granted to share info w AGENCY: DME        Emotional Assessment Appearance:: Appears stated age Attitude/Demeanor/Rapport: Engaged Affect (typically observed): Accepting, Appropriate Orientation: : Oriented to Self, Oriented to Place, Oriented to  Time, Oriented to Situation Alcohol / Substance Use: Not Applicable Psych Involvement: No (comment)  Admission diagnosis:  Lung mass [R91.8] Thrombocytopenia (HCC) [D69.6] Hypoxia [R09.02] Mediastinal widening [R93.89] Atrial fibrillation with RVR (HCC) [I48.91] Head and neck cancer (HCC) [C76.0] Malignant neoplasm of lung, unspecified laterality, unspecified part of lung (HCC) [C34.90] Patient Active Problem List   Diagnosis Date Noted   Lung mass 02/23/2023   Atrial fibrillation with RVR (HCC) 02/23/2023   Hypokalemia 02/23/2023   Acute respiratory  failure with hypoxia (HCC) 02/23/2023   Thrombocytopenia (HCC) 02/23/2023   COPD with acute exacerbation (HCC) 02/23/2023   Lactic acidosis 02/23/2023   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/30/2022   Pap smear of cervix shows high risk HPV present on 02/24/2021 03/03/2021   PCP:  [facility]:   [facility] [place], [place] - [address] [place]  [place] [address] Phone: [phone] Fax: [phone]  [facility] [place], [place] - [address] [place] [address] Phone: [phone] Fax: [phone]     Social Determinants of Health (SDOH) Social History: SDOH Screenings   Food Insecurity: No Food Insecurity (03/17/2022)  Transportation Needs: No Transportation Needs (03/17/2022)  Tobacco Use: High Risk (02/27/2023)   SDOH Interventions:     Readmission Risk Interventions     No data to display

## 2023-02-28 NOTE — Progress Notes (Signed)
SATURATION QUALIFICATIONS: (This note is used to comply with regulatory documentation for home oxygen)  Patient Saturations on Room Air at Rest = 86%   Patient Saturations on 3 Liters of oxygen while Ambulating = 93%  Please briefly explain why patient needs home oxygen: Pts oxygen dropped bellow 88% on RA

## 2023-02-28 NOTE — Progress Notes (Signed)
Ana Curry   DOB:1971/08/30   UE#:[id number]   JYN#:[id number]  Oncology follow up   Subjective: Patient is clinically stable, denies any new complaints.  She is on nasal cannula oxygen.   Objective:  Vitals:   02/28/23 0850 02/28/23 0853  BP:    Pulse:    Resp:    Temp:    SpO2: 96% 92%    Body mass index is 23.87 kg/m.  Intake/Output Summary (Last 24 hours) at 02/28/2023 0914 Last data filed at 02/27/2023 2200 Gross per 24 hour  Intake 1680 ml  Output 5 ml  Net 1675 ml     Sclerae unicteric  Oropharynx clear  Large right low neck mass   MSK no focal spinal tenderness, no peripheral edema  Neuro nonfocal  CBG (last 3)  No results for input(s): "GLUCAP" in the last 72 hours.   Labs:  Urine Studies No results for input(s): "UHGB", "CRYS" in the last 72 hours.  Invalid input(s): "UACOL", "UAPR", "USPG", "UPH", "UTP", "UGL", "UKET", "UBIL", "UNIT", "UROB", "ULEU", "UEPI", "UWBC", "URBC", "UBAC", "CAST", "UCOM", "BILUA"  Basic Metabolic Panel: Recent Labs  Lab 02/23/23 1859 02/24/23 0109 02/25/23 0142 02/26/23 0151 02/28/23 0547  NA 135 134* 138 138 137  K 3.4* 4.1 3.7 3.8 3.2*  CL 103 102 104 104 101  CO2 20* 19* 20* 20* 21*  GLUCOSE 150* 165* 142* 132* 112*  BUN 15 14 14 17 16   CREATININE 1.16* 1.16* 1.06* 1.09* 1.04*  CALCIUM 9.5 9.6 9.6 9.4 8.9   GFR Estimated Creatinine Clearance: 63.8 mL/min (A) (by C-G formula based on SCr of 1.04 mg/dL (H)). Liver Function Tests: Recent Labs  Lab 02/23/23 1128 02/24/23 0109 02/25/23 0142  AST 58* 52* 44*  ALT 27 27 28   ALKPHOS 203* 177* 161*  BILITOT 0.9 0.6 0.7  PROT 7.3 6.3* 6.2*  ALBUMIN 4.0 3.5 3.4*   No results for input(s): "LIPASE", "AMYLASE" in the last 168 hours. No results for input(s): "AMMONIA" in the last 168 hours. Coagulation profile Recent Labs  Lab 02/23/23 1128 02/24/23 1014  INR 1.1 1.1    CBC: Recent Labs  Lab 02/23/23 1128 02/24/23 0109 02/24/23 1014 02/25/23 0142  02/26/23 0151 02/28/23 0639  WBC 8.0 8.9  --  9.7 8.1 7.0  NEUTROABS 6.0  --   --   --   --   --   HGB 13.8 11.8*  --  11.0* 10.6* 11.7*  HCT 39.9 34.2*  --  32.9* 31.2* 33.5*  MCV 95.0 95.3  --  95.4 96.6 94.9  PLT 50* 44* 46* 45* 44* 51*   Cardiac Enzymes: No results for input(s): "CKTOTAL", "CKMB", "CKMBINDEX", "TROPONINI" in the last 168 hours. BNP: Invalid input(s): "POCBNP" CBG: No results for input(s): "GLUCAP" in the last 168 hours. D-Dimer No results for input(s): "DDIMER" in the last 72 hours. Hgb A1c No results for input(s): "HGBA1C" in the last 72 hours. Lipid Profile No results for input(s): "CHOL", "HDL", "LDLCALC", "TRIG", "CHOLHDL", "LDLDIRECT" in the last 72 hours. Thyroid function studies No results for input(s): "TSH", "T4TOTAL", "T3FREE", "THYROIDAB" in the last 72 hours.  Invalid input(s): "FREET3" Anemia work up No results for input(s): "VITAMINB12", "FOLATE", "FERRITIN", "TIBC", "IRON", "RETICCTPCT" in the last 72 hours. Microbiology Recent Results (from the past 240 hour(s))  Culture, blood (Routine x 2)     Status: None   Collection Time: 02/23/23 11:28 AM   Specimen: BLOOD  Result Value Ref Range Status   Specimen Description BLOOD BLOOD RIGHT  ARM  Final   Special Requests   Final    BOTTLES DRAWN AEROBIC AND ANAEROBIC Blood Culture adequate volume   Culture   Final    NO GROWTH 5 DAYS Performed at [facility], [address], [place], [place] [address]    Report Status 02/28/2023 FINAL  Final  Culture, blood (Routine x 2)     Status: None (Preliminary result)   Collection Time: 02/23/23 11:29 AM   Specimen: Left Antecubital; Blood  Result Value Ref Range Status   Specimen Description   Final    LEFT ANTECUBITAL BLOOD Performed at [facility], [address] [place], [place] [address]    Special Requests   Final    BOTTLES DRAWN AEROBIC AND ANAEROBIC Blood Culture adequate volume Performed at [facility], [address]  [place], [place] [address]    Culture  Setup Time   Final    AEROBIC BOTTLE ONLY GRAM POSITIVE COCCI Gram Stain Report Called to,Read Back By and Verified With: WEST,MIKE (Dixon) @ 1105 ON 02/27/2023 BY FRATTO,ASHLEY CRITICAL RESULT CALLED TO, READ BACK BY AND VERIFIED WITH: PHARMD L. CHEN [id number] @ 1840 FH    Culture   Final    GRAM POSITIVE COCCI TOO YOUNG TO READ Performed at [facility], [address], [place], [place] [address]    Report Status PENDING  Incomplete  Blood Culture ID Panel (Reflexed)     Status: None   Collection Time: 02/23/23 11:29 AM  Result Value Ref Range Status   Enterococcus faecalis NOT DETECTED NOT DETECTED Final   Enterococcus Faecium NOT DETECTED NOT DETECTED Final   Listeria monocytogenes NOT DETECTED NOT DETECTED Final   Staphylococcus species NOT DETECTED NOT DETECTED Final   Staphylococcus aureus (BCID) NOT DETECTED NOT DETECTED Final   Staphylococcus epidermidis NOT DETECTED NOT DETECTED Final   Staphylococcus lugdunensis NOT DETECTED NOT DETECTED Final   Streptococcus species NOT DETECTED NOT DETECTED Final   Streptococcus agalactiae NOT DETECTED NOT DETECTED Final   Streptococcus pneumoniae NOT DETECTED NOT DETECTED Final   Streptococcus pyogenes NOT DETECTED NOT DETECTED Final   A.calcoaceticus-baumannii NOT DETECTED NOT DETECTED Final   Bacteroides fragilis NOT DETECTED NOT DETECTED Final   Enterobacterales NOT DETECTED NOT DETECTED Final   Enterobacter cloacae complex NOT DETECTED NOT DETECTED Final   Escherichia coli NOT DETECTED NOT DETECTED Final   Klebsiella aerogenes NOT DETECTED NOT DETECTED Final   Klebsiella oxytoca NOT DETECTED NOT DETECTED Final   Klebsiella pneumoniae NOT DETECTED NOT DETECTED Final   Proteus species NOT DETECTED NOT DETECTED Final   Salmonella species NOT DETECTED NOT DETECTED Final   Serratia marcescens NOT DETECTED NOT DETECTED Final   Haemophilus influenzae NOT DETECTED NOT DETECTED Final    Neisseria meningitidis NOT DETECTED NOT DETECTED Final   Pseudomonas aeruginosa NOT DETECTED NOT DETECTED Final   Stenotrophomonas maltophilia NOT DETECTED NOT DETECTED Final   Candida albicans NOT DETECTED NOT DETECTED Final   Candida auris NOT DETECTED NOT DETECTED Final   Candida glabrata NOT DETECTED NOT DETECTED Final   Candida krusei NOT DETECTED NOT DETECTED Final   Candida parapsilosis NOT DETECTED NOT DETECTED Final   Candida tropicalis NOT DETECTED NOT DETECTED Final   Cryptococcus neoformans/gattii NOT DETECTED NOT DETECTED Final    Comment: Performed at [facility], [address] [place], [place] [address]  Culture, blood (Routine X 2) w Reflex to ID Panel     Status: None (Preliminary result)  Collection Time: 02/27/23 12:52 PM   Specimen: BLOOD RIGHT HAND  Result Value Ref Range Status   Specimen Description BLOOD RIGHT HAND  Final   Special Requests   Final    BOTTLES DRAWN AEROBIC AND ANAEROBIC Blood Culture adequate volume   Culture   Final    NO GROWTH < 24 HOURS Performed at [facility], [address] [place], [place] [address]    Report Status PENDING  Incomplete  Culture, blood (Routine X 2) w Reflex to ID Panel     Status: None (Preliminary result)   Collection Time: 02/27/23 12:53 PM   Specimen: BLOOD  Result Value Ref Range Status   Specimen Description BLOOD SITE NOT SPECIFIED  Final   Special Requests   Final    BOTTLES DRAWN AEROBIC AND ANAEROBIC Blood Culture adequate volume   Culture   Final    NO GROWTH < 24 HOURS Performed at [facility], [address] [place], [place] [address]    Report Status PENDING  Incomplete      Studies:  MR BRAIN W WO CONTRAST  Result Date: 02/28/2023 CLINICAL DATA:  Staging of small cell lung carcinoma EXAM: MRI HEAD WITHOUT AND WITH CONTRAST TECHNIQUE: Multiplanar, multiecho pulse sequences of the brain and surrounding structures were obtained without and with intravenous contrast.  CONTRAST:  7mL GADAVIST GADOBUTROL 1 MMOL/ML IV SOLN COMPARISON:  None Available. FINDINGS: Brain: No acute infarct, mass effect or extra-axial collection. No acute or chronic hemorrhage. Normal white matter signal. On diffusion-weighted imaging and the FLAIR sequence, there are bilateral convexity signal abnormalities within the subdural space. The midline structures are normal. There is smooth, diffuse pachymeningeal contrast enhancement. There are no intraparenchymal contrast enhancing lesions. Vascular: Major flow voids are preserved. Skull and upper cervical spine: Normal calvarium and skull base. Visualized upper cervical spine and soft tissues are normal. Sinuses/Orbits:Mastoid effusions. Paranasal sinuses are clear. Normal orbits. IMPRESSION: 1. No intraparenchymal metastatic disease. 2. Bilateral convexity signal abnormalities within the subdural space, which may indicate aging subdural blood. 3. Smooth, diffuse pachymeningeal contrast enhancement. 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 Metastatic disease felt less likely given the lack nodularity. Electronically Signed   By: Deatra Robinson M.D.   On: 02/28/2023 02:17   DG CHEST PORT 1 VIEW  Result Date: 02/27/2023 CLINICAL DATA:  Shortness of breath. EXAM: PORTABLE CHEST 1 VIEW COMPARISON:  February 23, 2023. FINDINGS: Stable cardiomegaly. Mild central pulmonary vascular congestion is noted. Minimal bibasilar pulmonary edema is noted with small right pleural effusion. Bony thorax is unremarkable. IMPRESSION: Stable cardiomegaly with mild central pulmonary vascular congestion. Minimal bibasilar pulmonary edema is noted with probable small right pleural effusion. Electronically Signed   By: Lupita Raider M.D.   On: 02/27/2023 16:27   CT ABDOMEN PELVIS W CONTRAST  Result Date: 02/27/2023 CLINICAL DATA:  Non-small-cell lung cancer.  Staging. * Tracking Code: BO * EXAM: CT ABDOMEN AND PELVIS WITH CONTRAST TECHNIQUE: Multidetector CT imaging of the abdomen and pelvis was performed using the standard protocol following bolus administration of intravenous contrast. RADIATION DOSE REDUCTION: This exam was performed according to the departmental dose-optimization program which includes automated exposure control, adjustment of the mA and/or kV according to patient size and/or use of iterative reconstruction technique. CONTRAST:  75mL OMNIPAQUE IOHEXOL 350 MG/ML SOLN COMPARISON:  Chest CTA 02/23/2023 FINDINGS: Lower chest: Bibasilar collapse/consolidation with bilateral  pleural effusions, right greater than left. Pleural effusions are progressive since the chest CT 4 days ago. Hepatobiliary: Small area of low attenuation in the anterior liver, adjacent to the falciform ligament, is in a characteristic location for focal fatty deposition. 9 mm hypodensity in the left liver (15/3) is too small to characterize but statistically is likely benign. There is no evidence for gallstones, gallbladder wall thickening, or pericholecystic fluid. No intrahepatic or extrahepatic biliary dilation. Pancreas: No focal mass lesion. No dilatation of the main duct. No intraparenchymal cyst. No peripancreatic edema. Spleen: No splenomegaly. No suspicious focal mass lesion. Adrenals/Urinary Tract: No adrenal nodule or mass. Kidneys unremarkable. No evidence for hydroureter. The urinary bladder appears normal for the degree of distention. Stomach/Bowel: Stomach is distended with food and fluid. Duodenum is normally positioned as is the ligament of Treitz. No small bowel wall thickening. No small bowel dilatation. The terminal ileum is normal. The appendix is normal. No gross colonic mass. No colonic wall thickening. Vascular/Lymphatic: Moderate atherosclerotic calcification is noted in the wall of the aorta. There is no gastrohepatic or hepatoduodenal ligament  lymphadenopathy. No retroperitoneal or mesenteric lymphadenopathy. No pelvic sidewall lymphadenopathy. Reproductive: Unremarkable. Other: No intraperitoneal free fluid. Musculoskeletal: No worrisome lytic or sclerotic osseous abnormality. IMPRESSION: 1. No definite evidence for metastatic disease in the abdomen or pelvis. 2. 9 mm hypodensity in the left liver is too small to characterize but statistically is likely benign. Attention on follow-up recommended. 3. Bibasilar collapse/consolidation with bilateral pleural effusions, right greater than left. Pleural effusions are progressive since the chest CT 4 days ago. 4.  Aortic Atherosclerosis (ICD10-I70.0). Electronically Signed   By: Kennith Center M.D.   On: 02/27/2023 11:47   [place] CORE BIOPSY (SOFT TISSUE)  Result Date: 02/26/2023 INDICATION: Cervical and supraclavicular adenopathy.  No diagnosis. EXAM: ULTRASOUND-GUIDED RIGHT SUPRACLAVICULAR NODAL MASS BIOPSY COMPARISON:  CT neck and chest, 09/24/2022. MEDICATIONS: None ANESTHESIA/SEDATION: Local anesthetic was administered. COMPLICATIONS: None immediate. TECHNIQUE: Informed written consent was obtained from the patient and/or patient's representative after a discussion of the risks, benefits and alternatives to treatment. Questions regarding the procedure were encouraged and answered. Initial ultrasound scanning demonstrated RIGHT supraclavicular nodal mass. An ultrasound image was saved for documentation purposes. The procedure was planned. A timeout was performed prior to the initiation of the procedure. The operative was prepped and draped in the usual sterile fashion, and a sterile drape was applied covering the operative field. A timeout was performed prior to the initiation of the procedure. Local anesthesia was provided with 1% lidocaine with epinephrine. Under direct ultrasound guidance, an 18 gauge core needle device was utilized to obtain to obtain 3 core needle biopsies of the RIGHT supraclavicular  nodal mass. The samples were placed in saline and submitted to pathology. The needle was removed and superficial hemostasis was achieved with manual compression. Post procedure scan was negative for significant hematoma. A dressing was applied. The patient tolerated the procedure well without immediate postprocedural complication. IMPRESSION: Successful core biopsy of RIGHT supraclavicular nodal mass. Roanna Banning, MD [facility] Electronically Signed   By: Roanna Banning M.D.   On: 02/26/2023 10:15    Assessment: 52 y.o.  Right hilar mass with extensive mediastinal and cervical adenopathy, likely small cell lung cancer  Moderate thrombocytopenia, likely DIC vs bone marrow involvement by malignancy Tumor lysis syndrome, resolved now      Plan:  -I discussed with pathologist Dr. [place] Callas today, cervical node biopsy showed high-grade malignant cells, concerning for  small cell carcinoma or high-grade lymphoma.  The slides was reviewed with a second pathologist in office who favor small cell carcinoma.  Additionally IHC studies are pending, which will likely return tomorrow.  -Her CT abdomen pelvis showed no additional metastasis, however showed significantly increased right pleural effusion, right thoracentesis was attempted today, not adequate fluid to be drained. -I recommend brain MRI with and without contrast, and bone scan for staging, rule out distant metastasis, especially given her moderate thrombocytopenia. -I reviewed the aggressive nature of small cell lung cancer, and treatment options.  Depending on the final staging, the treatment likely will be chemotherapy, with or without radiation.  I explained to her that this is unlikely curable disease. -Patient would like to get treatment and any pain hospital which is closer to her home. -I will f/u tomorrow    Malachy Mood, MD 02/27/2023

## 2023-03-01 ENCOUNTER — Ambulatory Visit
Admit: 2023-03-01 | Discharge: 2023-03-01 | Disposition: A | Discharge status: Home / Self Care | Payer: Medicaid Other | Attending: Radiation Oncology | Admitting: Radiation Oncology

## 2023-03-01 ENCOUNTER — Other Ambulatory Visit: Payer: Self-pay

## 2023-03-01 ENCOUNTER — Encounter: Payer: Self-pay | Admitting: Hematology

## 2023-03-01 ENCOUNTER — Encounter (HOSPITAL_COMMUNITY)
Admission: EM | Disposition: A | Discharge status: Home / Self Care | Payer: Self-pay | Source: Ambulatory Visit | Attending: Internal Medicine

## 2023-03-01 DIAGNOSIS — C3431 Malignant neoplasm of lower lobe, right bronchus or lung: Secondary | ICD-10-CM | POA: Insufficient documentation

## 2023-03-01 DIAGNOSIS — I871 Compression of vein: Secondary | ICD-10-CM | POA: Insufficient documentation

## 2023-03-01 DIAGNOSIS — C3491 Malignant neoplasm of unspecified part of right bronchus or lung: Secondary | ICD-10-CM

## 2023-03-01 DIAGNOSIS — R918 Other nonspecific abnormal finding of lung field: Secondary | ICD-10-CM | POA: Diagnosis not present

## 2023-03-01 LAB — CBC
HCT: 34.8 % — ABNORMAL LOW (ref 36.0–46.0)
Hemoglobin: 11.6 g/dL — ABNORMAL LOW (ref 12.0–15.0)
MCH: 31.6 pg (ref 26.0–34.0)
MCHC: 33.3 g/dL (ref 30.0–36.0)
MCV: 94.8 fL (ref 80.0–100.0)
Platelets: 47 10*3/uL — ABNORMAL LOW (ref 150–400)
RBC: 3.67 MIL/uL — ABNORMAL LOW (ref 3.87–5.11)
RDW: 14.2 % (ref 11.5–15.5)
WBC: 5.4 10*3/uL (ref 4.0–10.5)
nRBC: 3.9 % — ABNORMAL HIGH (ref 0.0–0.2)

## 2023-03-01 LAB — BASIC METABOLIC PANEL
Anion gap: 11 (ref 5–15)
BUN: 19 mg/dL (ref 6–20)
CO2: 23 mmol/L (ref 22–32)
Calcium: 9.3 mg/dL (ref 8.9–10.3)
Chloride: 100 mmol/L (ref 98–111)
Creatinine, Ser: 0.96 mg/dL (ref 0.44–1.00)
GFR, Estimated: >60 mL/min (ref >60)
Glucose, Bld: 107 mg/dL — ABNORMAL HIGH (ref 70–99)
Potassium: 3.4 mmol/L — ABNORMAL LOW (ref 3.5–5.1)
Sodium: 134 mmol/L — ABNORMAL LOW (ref 135–145)

## 2023-03-01 LAB — LACTIC ACID, PLASMA: Lactic Acid, Venous: 1.9 mmol/L (ref 0.5–1.9)

## 2023-03-01 LAB — CULTURE, BLOOD (ROUTINE X 2)

## 2023-03-01 SURGERY — ENDOBRONCHIAL ULTRASOUND (EBUS)
Anesthesia: General

## 2023-03-01 MED ORDER — POTASSIUM CHLORIDE CRYS ER 20 MEQ PO TBCR
40.0000 meq | EXTENDED_RELEASE_TABLET | Freq: Once | ORAL | Status: AC
  Administered 2023-03-01: 40 meq via ORAL
  Filled 2023-03-01: qty 2

## 2023-03-01 NOTE — Consult Note (Signed)
Chief Complaint: Patient was seen in consultation today for [facility] a cath placement and Bone marrow biopsy Chief Complaint  Patient presents with   Shortness of Breath   at the request of Dr Adrian Saran  Supervising Physician: Irish Lack  Patient Status: [facility] - In-pt  History of Present Illness: Ana Curry is a 52 y.o. female   FULL Code status per pt  New dx small cell lung cancer Pleural effusion; SOB Thrombocytopenia  Dr Mosetta Putt note 02/28/23:  -I discussed her cervical lymph node biopsy results with patient in detail, it confirms small cell carcinoma, from her right lung.  She has distal nodal metastasis to neck, questionable malignant right pleural effusion.  Due to her moderate thrombocytopenia, bone marrow involvement also need to be ruled out. -I reviewed her brain MRI findings, which was negative for metastasis or acute findings.  Her bone scan showed nonspecific uptake in the left lower extremity, no definitive evidence of bone metastasis. -I recommend bone marrow biopsy to rule out bone metastasis, also workup for her thrombocytopenia   Scheduled for Bone marrow bx and PAC placement in IR 6/21   Past Medical History:  Diagnosis Date   Hidradenitis suppurativa    History of abnormal cervical Pap smear    approx 2003  s/p LEEP   VIN III (vulvar intraepithelial neoplasia III)    Wears glasses     Past Surgical History:  Procedure Laterality Date   NO PAST SURGERIES     VULVA /PERINEUM BIOPSY N/A 07/12/2022   Procedure: VULVAR BIOPSY;  Surgeon: Carver Fila, MD;  Location: [facility];  Service: Gynecology;  Laterality: N/A;   VULVECTOMY PARTIAL N/A 07/12/2022   Procedure: WIDE EXCISION VULVA;  Surgeon: Carver Fila, MD;  Location: [facility];  Service: Gynecology;  Laterality: N/A;    Allergies: Codeine  Medications: Prior to Admission medications   Medication Sig Start Date End Date Taking? Authorizing  Provider  acetaminophen (TYLENOL) 500 MG tablet Take 1,000 mg by mouth every 6 (six) hours as needed.   Yes [provider]  clotrimazole-betamethasone (LOTRISONE) cream Apply 1 Application topically daily. To feet. 02/23/23  Yes [provider]  dextromethorphan-guaiFENesin (MUCINEX DM) 30-600 MG 12hr tablet Take 1 tablet by mouth 2 (two) times daily as needed for cough.   Yes [provider]  diazepam (VALIUM) 10 MG tablet Take 10 mg by mouth daily as needed for anxiety. 02/23/23  Yes [provider]  dicyclomine (BENTYL) 20 MG tablet Take 20 mg by mouth daily.   Yes [provider]  HYDROcodone-acetaminophen (NORCO/VICODIN) 5-325 MG tablet Take 1 tablet by mouth daily as needed for moderate pain or severe pain. 11/27/22  Yes [provider]  predniSONE (DELTASONE) 20 MG tablet Take 10-40 mg by mouth daily. Take 40 mg (2 tablets) by mouth daily for 4 days, then 20 mg (1 tablet) for 4 days, then 10 mg (1/2 tablet) for 4 days. 02/07/23  Yes [provider]  triamcinolone ointment (KENALOG) 0.1 % Apply 1 Application topically daily as needed (Ear rash). 08/01/22 08/01/23 Yes [provider]  VENTOLIN HFA 108 (90 Base) MCG/ACT inhaler Inhale 2 puffs into the lungs every 4 (four) hours as needed for wheezing or shortness of breath. 01/09/23  Yes [provider]  cefPROZIL (CEFZIL) 250 MG tablet Take 250 mg by mouth 2 (two) times daily. Patient not taking: Reported on 02/23/2023 02/07/23   [provider]  Family History  Problem Relation Age of Onset   Non-Hodgkin's lymphoma Mother 51   Colon cancer Neg Hx    Breast cancer Neg Hx    Ovarian cancer Neg Hx    Endometrial cancer Neg Hx    Pancreatic cancer Neg Hx    Prostate cancer Neg Hx     Social History   Socioeconomic History   Marital status: Single    Spouse name: Not on file   Number of children: 0   Years of education: Not on file   Highest  education level: High school graduate  Occupational History   Not on file  Tobacco Use   Smoking status: Every Day    Packs/day: 1.5    Types: Cigarettes    Start date: 27   Smokeless tobacco: Never  Vaping Use   Vaping Use: Never used  Substance and Sexual Activity   Alcohol use: Not Currently   Drug use: Never   Sexual activity: Not Currently    Birth control/protection: None  Other Topics Concern   Not on file  Social History Narrative   Not on file   Social Determinants of Health   Financial Resource Strain: Not on file  Food Insecurity: No Food Insecurity (03/17/2022)   Hunger Vital Sign    Worried About Running Out of Food in the Last Year: Never true    Ran Out of Food in the Last Year: Never true  Transportation Needs: No Transportation Needs (03/17/2022)   PRAPARE - Administrator, Civil Service (Medical): No    Lack of Transportation (Non-Medical): No  Physical Activity: Not on file  Stress: Not on file  Social Connections: Not on file    Review of Systems: A 12 point ROS discussed and pertinent positives are indicated in the HPI above.  All other systems are negative.  Review of Systems  Constitutional:  Positive for activity change and fatigue.  Respiratory:  Positive for cough and shortness of breath.   Gastrointestinal:  Positive for nausea.  Neurological:  Positive for weakness.  Psychiatric/Behavioral:  Negative for behavioral problems and confusion.     Vital Signs: BP 99/60 (BP Location: Left Arm)   Pulse 96   Temp 98.9 F (37.2 C) (Oral)   Resp 19   Ht 5\' 8"  (1.727 m)   Wt 157 lb (71.2 kg)   LMP 01/22/2017   SpO2 90%   BMI 23.87 kg/m     Physical Exam Vitals reviewed.  Cardiovascular:     Rate and Rhythm: Normal rate and regular rhythm.  Pulmonary:     Breath sounds: Normal breath sounds. No wheezing.  Musculoskeletal:        General: Normal range of motion.  Skin:    General: Skin is warm.  Neurological:     Mental  Status: She is alert and oriented to person, place, and time.  Psychiatric:        Behavior: Behavior normal.     Imaging: IR US CHEST  Result Date: 02/28/2023 CLINICAL DATA:  Evaluate for thoracentesis. EXAM: CHEST ULTRASOUND COMPARISON:  Chest radiograph 02/28/2023 FINDINGS: Small amount of right pleural fluid was identified. No percutaneous window for thoracentesis due to a flap of lung. Thoracentesis not performed. IMPRESSION: Small right pleural effusion. Thoracentesis not performed due to lack of a safe percutaneous window. Electronically Signed   By: Richarda Overlie M.D.   On: 02/28/2023 16:10   NM Bone Scan Whole Body  Result Date: 02/28/2023 CLINICAL DATA:  Lung cancer EXAM: NUCLEAR MEDICINE WHOLE BODY BONE SCAN TECHNIQUE: Whole body anterior and posterior images were obtained approximately 3 hours after intravenous injection of radiopharmaceutical. RADIOPHARMACEUTICALS:  21.0 mCi Technetium-33m MDP IV COMPARISON:  CT scan earlier June 2024 of multiple areas. FINDINGS: Physiologic distribution of radiotracer overall. There is some uptake involving the right midfoot consistent with known provided history of injury in the past. There is slight asymmetric uptake along the proximal left tibia from the other side. There is also some subtle areas of low uptake along the distal femoral metaphysis. Recommend dedicated x-rays. Mild areas of degenerative uptake such as along the extremities. Of note the kidneys are not as well seen as typical. Please correlate with clinical findings. IMPRESSION: Subtle asymmetric uptake involving the proximal left tibia. Slight low uptake along the distal femurs. Recommend dedicated x-rays if there is no known history. Atypical low uptake of the renal parenchyma. The rest of the findings do not suggest super scan but please correlate with the patient's renal function and other history. This may be technical. Electronically Signed   By: Karen Kays M.D.   On: 02/28/2023 14:40    DG CHEST PORT 1 VIEW  Result Date: 02/28/2023 CLINICAL DATA:  Pleural effusion on right. EXAM: PORTABLE CHEST 1 VIEW COMPARISON:  Radiographs 02/27/2023 and 02/23/2023. Abdominal CT 02/27/2023 and chest CT 02/23/2023. FINDINGS: 0845 hours. Mild patient rotation to the right. Grossly stable heart size and mediastinal contours with known extensive mediastinal lymphadenopathy. There are enlarging right greater than left pleural effusions with increasing atelectasis at both lung bases. No pneumothorax. The bones appear unchanged. Telemetry leads overlie the chest. IMPRESSION: 1. Enlarging right greater than left pleural effusions with increasing bibasilar atelectasis. 2. Grossly stable mediastinal lymphadenopathy from recent chest CT, presumed lung cancer. Electronically Signed   By: Carey Bullocks M.D.   On: 02/28/2023 12:55   MR BRAIN W WO CONTRAST  Result Date: 02/28/2023 CLINICAL DATA:  Staging of small cell lung carcinoma EXAM: MRI HEAD WITHOUT AND WITH CONTRAST TECHNIQUE: Multiplanar, multiecho pulse sequences of the brain and surrounding structures were obtained without and with intravenous contrast. CONTRAST:  7mL GADAVIST GADOBUTROL 1 MMOL/ML IV SOLN COMPARISON:  None Available. FINDINGS: Brain: No acute infarct, mass effect or extra-axial collection. No acute or chronic hemorrhage. Normal white matter signal. On diffusion-weighted imaging and the FLAIR sequence, there are bilateral convexity signal abnormalities within the subdural space. The midline structures are normal. There is smooth, diffuse pachymeningeal contrast enhancement. There are no intraparenchymal contrast enhancing lesions. Vascular: Major flow voids are preserved. Skull and upper cervical spine: Normal calvarium and skull base. Visualized upper cervical spine and soft tissues are normal. Sinuses/Orbits:Mastoid effusions. Paranasal sinuses are clear. Normal orbits. IMPRESSION: 1. No intraparenchymal metastatic disease. 2. Bilateral  convexity signal abnormalities within the subdural space, which may indicate aging subdural blood. 3. Smooth, diffuse pachymeningeal contrast enhancement. 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 Metastatic disease felt less likely given the lack nodularity. Electronically Signed   By: Deatra Robinson M.D.   On: 02/28/2023 02:17   DG CHEST PORT 1 VIEW  Result Date: 02/27/2023 CLINICAL DATA:  Shortness of breath. EXAM: PORTABLE CHEST 1 VIEW COMPARISON:  February 23, 2023. FINDINGS: Stable cardiomegaly. Mild central pulmonary vascular congestion is noted. Minimal bibasilar pulmonary edema is noted with small right pleural effusion. Bony thorax is unremarkable. IMPRESSION: Stable cardiomegaly with mild central pulmonary vascular  congestion. Minimal bibasilar pulmonary edema is noted with probable small right pleural effusion. Electronically Signed   By: Lupita Raider M.D.   On: 02/27/2023 16:27   CT ABDOMEN PELVIS W CONTRAST  Result Date: 02/27/2023 CLINICAL DATA:  Non-small-cell lung cancer. Staging. * Tracking Code: BO * EXAM: CT ABDOMEN AND PELVIS WITH CONTRAST TECHNIQUE: Multidetector CT imaging of the abdomen and pelvis was performed using the standard protocol following bolus administration of intravenous contrast. RADIATION DOSE REDUCTION: This exam was performed according to the departmental dose-optimization program which includes automated exposure control, adjustment of the mA and/or kV according to patient size and/or use of iterative reconstruction technique. CONTRAST:  75mL OMNIPAQUE IOHEXOL 350 MG/ML SOLN COMPARISON:  Chest CTA 02/23/2023 FINDINGS: Lower chest: Bibasilar collapse/consolidation with bilateral pleural effusions, right greater than left. Pleural effusions are progressive since the chest CT 4 days ago. Hepatobiliary: Small area of low attenuation in the anterior liver,  adjacent to the falciform ligament, is in a characteristic location for focal fatty deposition. 9 mm hypodensity in the left liver (15/3) is too small to characterize but statistically is likely benign. There is no evidence for gallstones, gallbladder wall thickening, or pericholecystic fluid. No intrahepatic or extrahepatic biliary dilation. Pancreas: No focal mass lesion. No dilatation of the main duct. No intraparenchymal cyst. No peripancreatic edema. Spleen: No splenomegaly. No suspicious focal mass lesion. Adrenals/Urinary Tract: No adrenal nodule or mass. Kidneys unremarkable. No evidence for hydroureter. The urinary bladder appears normal for the degree of distention. Stomach/Bowel: Stomach is distended with food and fluid. Duodenum is normally positioned as is the ligament of Treitz. No small bowel wall thickening. No small bowel dilatation. The terminal ileum is normal. The appendix is normal. No gross colonic mass. No colonic wall thickening. Vascular/Lymphatic: Moderate atherosclerotic calcification is noted in the wall of the aorta. There is no gastrohepatic or hepatoduodenal ligament lymphadenopathy. No retroperitoneal or mesenteric lymphadenopathy. No pelvic sidewall lymphadenopathy. Reproductive: Unremarkable. Other: No intraperitoneal free fluid. Musculoskeletal: No worrisome lytic or sclerotic osseous abnormality. IMPRESSION: 1. No definite evidence for metastatic disease in the abdomen or pelvis. 2. 9 mm hypodensity in the left liver is too small to characterize but statistically is likely benign. Attention on follow-up recommended. 3. Bibasilar collapse/consolidation with bilateral pleural effusions, right greater than left. Pleural effusions are progressive since the chest CT 4 days ago. 4.  Aortic Atherosclerosis (ICD10-I70.0). Electronically Signed   By: Kennith Center M.D.   On: 02/27/2023 11:47   [place] CORE BIOPSY (SOFT TISSUE)  Result Date: 02/26/2023 INDICATION: Cervical and  supraclavicular adenopathy.  No diagnosis. EXAM: ULTRASOUND-GUIDED RIGHT SUPRACLAVICULAR NODAL MASS BIOPSY COMPARISON:  CT neck and chest, 09/24/2022. MEDICATIONS: None ANESTHESIA/SEDATION: Local anesthetic was administered. COMPLICATIONS: None immediate. TECHNIQUE: Informed written consent was obtained from the patient and/or patient's representative after a discussion of the risks, benefits and alternatives to treatment. Questions regarding the procedure were encouraged and answered. Initial ultrasound scanning demonstrated RIGHT supraclavicular nodal mass. An ultrasound image was saved for documentation purposes. The procedure was planned. A timeout was performed prior to the initiation of the procedure. The operative was prepped and draped in the usual sterile fashion, and a sterile drape was applied covering the operative field. A timeout was performed prior to the initiation of the procedure. Local anesthesia was provided with 1% lidocaine with epinephrine. Under direct ultrasound guidance, an 18 gauge core needle device was utilized to obtain to obtain 3 core needle biopsies of the RIGHT supraclavicular nodal mass. The samples were placed  in saline and submitted to pathology. The needle was removed and superficial hemostasis was achieved with manual compression. Post procedure scan was negative for significant hematoma. A dressing was applied. The patient tolerated the procedure well without immediate postprocedural complication. IMPRESSION: Successful core biopsy of RIGHT supraclavicular nodal mass. Roanna Banning, MD [facility] Electronically Signed   By: Roanna Banning M.D.   On: 02/26/2023 10:15   ECHOCARDIOGRAM COMPLETE  Result Date: 02/24/2023    ECHOCARDIOGRAM REPORT   Patient Name:   MARLAYAH KAUFMANN Date of Exam: 02/24/2023 Medical Rec #:  [id number]         Height:       68.0 in Accession #:    [id number]        Weight:       157.0 lb Date of  Birth:  03/24/1971        BSA:          1.844 m Patient Age:    51 years          BP:           111/73 mmHg Patient Gender: F                 HR:           88 bpm. Exam Location:  Inpatient Procedure: 2D Echo, Cardiac Doppler and Color Doppler Indications:    A-FIB  History:        Patient has no prior history of Echocardiogram examinations.                 Arrythmias:Atrial Fibrillation; Risk Factors:Current Smoker.  Sonographer:    Darlys Gales Referring Phys: [phone] JACOB J STINSON IMPRESSIONS  1. Left ventricular ejection fraction, by estimation, is 60 to 65%. The left ventricle has normal function. The left ventricle has no regional wall motion abnormalities. Left ventricular diastolic parameters are indeterminate.  2. Right ventricular systolic function is normal. The right ventricular size is normal. Tricuspid regurgitation signal is inadequate for assessing PA pressure.  3. The mitral valve is normal in structure. No evidence of mitral valve regurgitation.  4. The aortic valve is grossly normal. Aortic valve regurgitation is not visualized.  5. The inferior vena cava is dilated in size with <50% respiratory variability, suggesting right atrial pressure of 15 mmHg. FINDINGS  Left Ventricle: Left ventricular ejection fraction, by estimation, is 60 to 65%. The left ventricle has normal function. The left ventricle has no regional wall motion abnormalities. The left ventricular internal cavity size was normal in size. There is  no left ventricular hypertrophy. Left ventricular diastolic parameters are indeterminate. Right Ventricle: The right ventricular size is normal. Right ventricular systolic function is normal. Tricuspid regurgitation signal is inadequate for assessing PA pressure. Left Atrium: Left atrial size was normal in size. Right Atrium: Right atrial size was normal in size. Pericardium: There is no evidence of pericardial effusion. Mitral Valve: The mitral valve is normal in structure. No evidence of  mitral valve regurgitation. Tricuspid Valve: Tricuspid valve regurgitation is not demonstrated. Aortic Valve: The aortic valve is grossly normal. Aortic valve regurgitation is not visualized. Aortic valve mean gradient measures 5.0 mmHg. Aortic valve peak gradient measures 7.6 mmHg. Aortic valve area, by VTI measures 2.82 cm. Pulmonic Valve: Pulmonic valve regurgitation is not visualized. Aorta: The aortic root and ascending aorta are structurally normal, with no evidence of dilitation. Venous: The inferior vena cava is dilated in size with less than  50% respiratory variability, suggesting right atrial pressure of 15 mmHg. IAS/Shunts: The interatrial septum was not well visualized.  LEFT VENTRICLE PLAX 2D LVIDd:         5.10 cm   Diastology LVIDs:         3.00 cm   LV e' medial:    7.07 cm/s LV PW:         0.80 cm   LV E/e' medial:  12.3 LV IVS:        0.70 cm   LV e' lateral:   10.20 cm/s LVOT diam:     1.80 cm   LV E/e' lateral: 8.5 LV SV:         76 LV SV Index:   41 LVOT Area:     2.54 cm  RIGHT VENTRICLE             IVC RV S prime:     15.10 cm/s  IVC diam: 2.50 cm TAPSE (M-mode): 2.5 cm LEFT ATRIUM             Index        RIGHT ATRIUM           Index LA Vol (A2C):   21.7 ml 11.77 ml/m  RA Area:     14.00 cm LA Vol (A4C):   27.3 ml 14.81 ml/m  RA Volume:   34.60 ml  18.76 ml/m LA Biplane Vol: 24.7 ml 13.40 ml/m  AORTIC VALVE AV Area (Vmax):    2.42 cm AV Area (Vmean):   2.26 cm AV Area (VTI):     2.82 cm AV Vmax:           138.00 cm/s AV Vmean:          102.000 cm/s AV VTI:            0.269 m AV Peak Grad:      7.6 mmHg AV Mean Grad:      5.0 mmHg LVOT Vmax:         131.00 cm/s LVOT Vmean:        90.500 cm/s LVOT VTI:          0.298 m LVOT/AV VTI ratio: 1.11  AORTA Ao Root diam: 3.20 cm Ao Asc diam:  3.50 cm MITRAL VALVE MV Area (PHT): 5.23 cm     SHUNTS MV Decel Time: 145 msec     Systemic VTI:  0.30 m MV E velocity: 87.00 cm/s   Systemic Diam: 1.80 cm MV A velocity: 102.00 cm/s MV E/A ratio:  0.85  Photographer signed by Carolan Clines Signature Date/Time: 02/24/2023/3:44:27 PM    Final    CT Angio Chest PE W and/or Wo Contrast  Result Date: 02/23/2023 CLINICAL DATA:  Shortness of breath, right neck mass, chest mass, headache * Tracking Code: BO * EXAM: CT ANGIOGRAPHY CHEST WITH CONTRAST TECHNIQUE: Multidetector CT imaging of the chest was performed using the standard protocol during bolus administration of intravenous contrast. Multiplanar CT image reconstructions and MIPs were obtained to evaluate the vascular anatomy. RADIATION DOSE REDUCTION: This exam was performed according to the departmental dose-optimization program which includes automated exposure control, adjustment of the mA and/or kV according to patient size and/or use of iterative reconstruction technique. CONTRAST:  75mL OMNIPAQUE IOHEXOL 350 MG/ML SOLN COMPARISON:  None Available. FINDINGS: Cardiovascular: No filling defect is identified in the pulmonary arterial tree to suggest pulmonary embolus. Mild atheromatous vascular calcification of the aortic arch. The large mediastinal mass bulges into  the SVC posteriorly for example on image 128 series 4, probably from extrinsic compression, definite invasion is not observed. Because of lack of complete opacification from pulmonary arterial timing, patency of the left brachiocephalic vein is uncertain. Moderate pericardial effusion. Mediastinum/Nodes: Large right infrahilar and hilar mass extending into the mediastinum and tracking all the way to the thoracic inlet. This extends around the right mainstem bronchus, right upper lobe bronchus, and bronchus intermedius as well as the right lower lobe and right middle lobe bronchi, and likewise surrounds and narrows the right pulmonary artery and its branches. In the subcarinal region, the conglomerate mass measures about 7.9 by 5.6 cm (image 144, series 4) and the mass flattens and narrows the SVC without overt occlusion. In the right  infrahilar region, the observed mass measures 4.7 by 3.9 cm on image 179 series 4. Presumed lymph node anterior to the SVC in the upper chest measures 2.6 cm in short axis on image 36 series 3. A right lower neck level IV mass/lymph node measures 3.4 cm in short axis on image 10 series 3. Lungs/Pleura: The mass in the right chest displaces the upper trachea to the left. There is substantial narrowing of the right upper lobe bronchus, right middle lobe bronchus, and severe narrowing of the right lower lobe bronchus due to the surrounding mass. Patchy regions of nodularity in the right lower lobe distal to the mass may represent postobstructive pneumonitis or satellite tumor nodules. Centrilobular emphysema. Scarring or atelectasis in the right middle lobe and right upper lobe anteriorly. Mild dependent atelectasis in both lower lobes. Trace right pleural effusion, image 50 series 3. Upper Abdomen: Small nonspecific hypodense lesion in the left hepatic lobe 0.9 cm in long axis on image 96 series 3. Musculoskeletal: Unremarkable Review of the MIP images confirms the above findings. IMPRESSION: 1. Large right infrahilar and hilar mass extending into the mediastinum and tracking all the way to the thoracic inlet. This extends around the right mainstem bronchus, right upper lobe bronchus, and bronchus intermedius as well as the right lower lobe and right middle lobe bronchi, and likewise surrounds and narrows the right pulmonary artery and its branches. The mass flattens and narrows the SVC without overt occlusion, and is confluent with bulky right level IV neck adenopathy. Top differential diagnostic considerations include lung cancer (such as squamous cell or small cell) versus extensive thoracic and right lower neck lymphoma. 2. Patchy regions of nodularity in the right lower lobe distal to the mass may represent postobstructive pneumonitis or satellite tumor nodules. 3. Presumed lymph node anterior to the SVC in the  upper chest measures 2.6 cm in short axis. 4. A right lower neck level IV mass/lymph node measures 3.4 cm in short axis, compatible with malignancy. 5. Moderate pericardial effusion. 6. Trace right pleural effusion. 7. Centrilobular emphysema. 8. Small nonspecific hypodense lesion in the left hepatic lobe 0.9 cm in long axis. 9. No pulmonary embolus is identified. 10. Aortic atherosclerosis. Aortic Atherosclerosis (ICD10-I70.0) and Emphysema (ICD10-J43.9). Electronically Signed   By: Gaylyn Rong M.D.   On: 02/23/2023 13:39   CT Soft Tissue Neck W Contrast  Result Date: 02/23/2023 CLINICAL DATA:  Neck mass, nonpulsatile right sided neck mass EXAM: CT NECK WITH CONTRAST TECHNIQUE: Multidetector CT imaging of the neck was performed using the standard protocol following the bolus administration of intravenous contrast. RADIATION DOSE REDUCTION: This exam was performed according to the departmental dose-optimization program which includes automated exposure control, adjustment of the mA and/or kV according to  patient size and/or use of iterative reconstruction technique. CONTRAST:  75mL OMNIPAQUE IOHEXOL 350 MG/ML SOLN COMPARISON:  None Available. FINDINGS: Pharynx and larynx: Normal. No mass or swelling. Salivary glands: No inflammation, mass, or stone. Thyroid: Normal. Lymph nodes: There is bulky lower cervical and mediastinal lymphadenopathy involving the right level 3 and level 4 lymph node stations, the right supraclavicular region, and the anterior and posterior mediastinum. Cervical lymphadenopathy significant mass effect on the right internal jugular vein (series 5, image 68). Vascular: Negative. Mass effect on the right internal jugular vein, as above Limited intracranial: Negative. Visualized orbits: Negative. Mastoids and visualized paranasal sinuses: Moderate left-sided mastoid effusion. There is pneumatization of the petrous apices with air-fluid levels. Paranasal sinuses are clear. Orbits are  unremarkable. Skeleton: No acute or aggressive process. Upper chest: Mild paraseptal emphysema. See same day CT chest for additional findings Other: None. IMPRESSION: 1. Bulky right lower cervical and mediastinal lymphadenopathy, concerning for metastatic disease or lymphoma. 2. See separate CT chest for additional findings. Emphysema (ICD10-J43.9). Electronically Signed   By: Lorenza Cambridge M.D.   On: 02/23/2023 13:17   CT Head Wo Contrast  Result Date: 02/23/2023 CLINICAL DATA:  Headache EXAM: CT HEAD WITHOUT CONTRAST TECHNIQUE: Contiguous axial images were obtained from the base of the skull through the vertex without intravenous contrast. RADIATION DOSE REDUCTION: This exam was performed according to the departmental dose-optimization program which includes automated exposure control, adjustment of the mA and/or kV according to patient size and/or use of iterative reconstruction technique. COMPARISON:  None Available. FINDINGS: Brain: There is no acute intracranial hemorrhage, extra-axial fluid collection, or acute infarct Parenchymal volume is normal. The ventricles are normal in size. Gray-white differentiation is preserved The pituitary and suprasellar region are normal. There is no mass lesion. There is no mass effect or midline shift. Vascular: No hyperdense vessel or unexpected calcification. Skull: Normal. Negative for fracture or focal lesion. Sinuses/Orbits: The imaged paranasal sinuses are clear. The globes and orbits are unremarkable. Other: There are small bilateral mastoid effusions. IMPRESSION: 1. No acute intracranial pathology. 2. Small bilateral mastoid effusions. Electronically Signed   By: Lesia Hausen M.D.   On: 02/23/2023 13:15   DG Chest Port 1 View  Result Date: 02/23/2023 CLINICAL DATA:  Sepsis EXAM: PORTABLE CHEST 1 VIEW COMPARISON:  X-ray 01/09/2023 FINDINGS: No pneumothorax, effusion or edema. Calcified aorta. Overlapping cardiac leads. There is new widening of the mediastinum  compared to prior x-ray and fullness of the right lung hilum. Recommend further evaluation with CT to delineate for potential mass. IMPRESSION: New widening of the mediastinum with masslike fullness in the right lung hilum. Recommend follow up contrast CT to further delineate Electronically Signed   By: Karen Kays M.D.   On: 02/23/2023 13:11    Labs:  CBC: Recent Labs    02/24/23 0109 02/24/23 1014 02/25/23 0142 02/26/23 0151 02/28/23 0639  WBC 8.9  --  9.7 8.1 7.0  HGB 11.8*  --  11.0* 10.6* 11.7*  HCT 34.2*  --  32.9* 31.2* 33.5*  PLT 44* 46* 45* 44* 51*    COAGS: Recent Labs    02/23/23 1128 02/24/23 1014  INR 1.1 1.1  APTT  --  24    BMP: Recent Labs    02/24/23 0109 02/25/23 0142 02/26/23 0151 02/28/23 0547  NA 134* 138 138 137  K 4.1 3.7 3.8 3.2*  CL 102 104 104 101  CO2 19* 20* 20* 21*  GLUCOSE 165* 142* 132* 112*  BUN 14  14 17 16   CALCIUM 9.6 9.6 9.4 8.9  CREATININE 1.16* 1.06* 1.09* 1.04*  GFRNONAA 57* >60 >60 >60    LIVER FUNCTION TESTS: Recent Labs    02/23/23 1128 02/24/23 0109 02/25/23 0142  BILITOT 0.9 0.6 0.7  AST 58* 52* 44*  ALT 27 27 28   ALKPHOS 203* 177* 161*  PROT 7.3 6.3* 6.2*  ALBUMIN 4.0 3.5 3.4*    TUMOR MARKERS: No results for input(s): "AFPTM", "CEA", "CA199", "CHROMGRNA" in the last 8760 hours.  Assessment and Plan:  Scheduled for Bone marrow biopsy and Port a cath placement in IR 6/21  Risks and benefits of bone marrow biopsy was discussed with the patient and/or patient's family including, but not limited to bleeding, infection, damage to adjacent structures or low yield requiring additional tests. All of the questions were answered and there is agreement to proceed.  Risks and benefits of image guided port-a-catheter placement was discussed with the patient including, but not limited to bleeding, infection, pneumothorax, or fibrin sheath development and need for additional procedures. All of the patient's questions  were answered, patient is agreeable to proceed. Consent signed and in chart.  Thank you for this interesting consult.  I greatly enjoyed meeting ELFIE DOELLING and look forward to participating in their care.  A copy of this report was sent to the requesting provider on this date.  Electronically Signed: Robet Leu, PA-C 03/01/2023, 8:30 AM   I spent a total of 20 Minutes    in face to face in clinical consultation, greater than 50% of which was counseling/coordinating care for Bone marrow bx and PAC

## 2023-03-01 NOTE — Progress Notes (Signed)
[facility] at bedside to transport pt to [facility] for appointment

## 2023-03-01 NOTE — Consult Note (Signed)
Radiation Oncology         [phone]) [phone] ________________________________  Name: Ana Curry        MRN: [id number]  Date of Service 03/01/23 DOB: 22-Oct-1970  WJ:[id number], [facility]    REFERRING PHYSICIAN: Dr. Mosetta Putt   DIAGNOSIS: The primary encounter diagnosis was Thrombocytopenia (HCC). Diagnoses of Head and neck cancer (HCC), Mediastinal widening, Malignant neoplasm of lung, unspecified laterality, unspecified part of lung (HCC), Hypoxia, Atrial fibrillation with RVR (HCC), Tumor lysis syndrome, and Small cell carcinoma of lung, right (HCC) were also pertinent to this visit.   HISTORY OF PRESENT ILLNESS: Ana Curry is a 52 y.o. female seen at the request of Dr. Mosetta Putt for new diagnosis of small cell lung cancer.  The patient has a history of VIN and underwent wide local excision of the vulva in November 2023.  She had been experiencing symptoms of shortness of breath and was seen on several occasions by her PCP and ultimately diagnosed with pneumonia though she had not had wheezing or coughing.  She presented on 02/23/2023 complaining of Shortness of breath requiring supplemental oxygen.  CT head was negative for intracranial pathology, CT imaging of the chest showed a large right infrahilar and hilar mass extending into the mediastinum and tracking into the way of the thoracic inlet extending around the right mainstem bronchus, right upper lobe bronchus, and bronchus intermedius as well as the right lower lobe and right middle lobe bronchi surrounding and narrowing the right pulmonary artery and its branches.  The mass flattens and narrows the SVC without overt occlusion and is confluent with bulky right level 4 neck adenopathy.  Patchy regions of nodularity in the right lower lobe distal to the mass were noted concerning for postobstructive pneumonitis for satellite disease.  A 2.6 cm lymph node anterior to the SVC and right lower neck level 4 mass/lymph node measured 3.4 cm.   Moderate pericardial effusion right pleural effusion and nonspecific hypodense lesion in the left hepatic lobe are noted.  No embolism was appreciated and she had emphysema and atherosclerotic changes.  A CT of the neck confirmed bulky right lower cervical and mediastinal lymphadenopathy concerning for metastatic disease.  She underwent a core biopsy of the supraclavicular region on 02/26/2023 which showed small cell lung cancer.  She has met with Dr. Mosetta Putt who has recommended urgent chemotherapy in the near future.  The patient is interested in trying to have treatment as an outpatient, and we have been asked to see her to consider urgent radiotherapy.  She is also thrombocytopenic and for this reason we will be undergoing bone marrow biopsy tomorrow and Port-A-Cath placement in interventional radiology.    PREVIOUS RADIATION THERAPY: No   PAST MEDICAL HISTORY:  Past Medical History:  Diagnosis Date   Hidradenitis suppurativa    History of abnormal cervical Pap smear    approx 2003  s/p LEEP   VIN III (vulvar intraepithelial neoplasia III)    Wears glasses        PAST SURGICAL HISTORY: Past Surgical History:  Procedure Laterality Date   NO PAST SURGERIES     VULVA /PERINEUM BIOPSY N/A 07/12/2022   Procedure: VULVAR BIOPSY;  Surgeon: Carver Fila, MD;  Location: [facility];  Service: Gynecology;  Laterality: N/A;   VULVECTOMY PARTIAL N/A 07/12/2022   Procedure: WIDE EXCISION VULVA;  Surgeon: Carver Fila, MD;  Location: [facility];  Service: Gynecology;  Laterality: N/A;     FAMILY HISTORY:  Family History  Problem Relation Age of Onset   Non-Hodgkin's lymphoma Mother 41   Colon cancer Neg Hx    Breast cancer Neg Hx    Ovarian cancer Neg Hx    Endometrial cancer Neg Hx    Pancreatic cancer Neg Hx    Prostate cancer Neg Hx      SOCIAL HISTORY:  reports that she has been smoking cigarettes. She started smoking about 38 years ago. She  has been smoking an average of 1.5 packs per day. She has never used smokeless tobacco. She reports that she does not currently use alcohol. She reports that she does not use drugs.  The patient is single and lives in [place].  She works at a [employer].  She is hoping to get home so she can spend time with her puppy.   ALLERGIES: Codeine   MEDICATIONS:  Current Facility-Administered Medications  Medication Dose Route Frequency Provider Last Rate Last Admin   acetaminophen (TYLENOL) tablet 650 mg  650 mg Oral Q6H PRN Burnadette Pop, MD   650 mg at 02/28/23 1012   allopurinol (ZYLOPRIM) tablet 300 mg  300 mg Oral BID Malachy Mood, MD   300 mg at 03/01/23 1610   amiodarone (PACERONE) tablet 200 mg  200 mg Oral Daily Rinaldo Cloud, MD   200 mg at 03/01/23 0907   budesonide (PULMICORT) nebulizer solution 0.5 mg  0.5 mg Nebulization BID Burnadette Pop, MD   0.5 mg at 03/01/23 9604   dextromethorphan-guaiFENesin (MUCINEX DM) 30-600 MG per 12 hr tablet 1 tablet  1 tablet Oral BID PRN Levie Heritage, DO       dicyclomine (BENTYL) tablet 20 mg  20 mg Oral Daily Levie Heritage, DO   20 mg at 03/01/23 0909   fluticasone furoate-vilanterol (BREO ELLIPTA) 100-25 MCG/ACT 1 puff  1 puff Inhalation Daily Levie Heritage, DO   1 puff at 03/01/23 5409   HYDROcodone-acetaminophen (NORCO/VICODIN) 5-325 MG per tablet 1 tablet  1 tablet Oral Daily PRN Levie Heritage, DO   1 tablet at 03/01/23 0907   ipratropium-albuterol (DUONEB) 0.5-2.5 (3) MG/3ML nebulizer solution 3 mL  3 mL Nebulization Q4H PRN Tim Lair, PA-C   3 mL at 02/28/23 8119   LORazepam (ATIVAN) injection 0.5 mg  0.5 mg Intravenous TID PRN Levie Heritage, DO   0.5 mg at 03/01/23 0005   metoprolol tartrate (LOPRESSOR) tablet 25 mg  25 mg Oral BID Levie Heritage, DO   25 mg at 03/01/23 1478   nicotine (NICODERM CQ - dosed in mg/24 hours) patch 21 mg  21 mg Transdermal Daily Levie Heritage, DO   21 mg at 03/01/23 0907    ondansetron (ZOFRAN) tablet 4 mg  4 mg Oral Q6H PRN Levie Heritage, DO       Or   ondansetron [facility]) injection 4 mg  4 mg Intravenous Q6H PRN Levie Heritage, DO       polyethylene glycol (MIRALAX / GLYCOLAX) packet 17 g  17 g Oral Daily Adhikari, Amrit, MD       potassium chloride SA (KLOR-CON M) CR tablet 40 mEq  40 mEq Oral Once Burnadette Pop, MD       predniSONE (DELTASONE) tablet 40 mg  40 mg Oral Q breakfast Burnadette Pop, MD   40 mg at 03/01/23 2956   senna-docusate (Senokot-S) tablet 1 tablet  1 tablet Oral BID Burnadette Pop, MD  REVIEW OF SYSTEMS: On review of systems, the patient reports that her hospitalization she has never been oxygen dependent.  She states that she is feeling very fatigued and lethargic today after all of the workup that she was having yesterday and tests that she has been through.  She states that she does get short winded with movement.  She denies any hemoptysis.  No other complaints are verbalized.     PHYSICAL EXAM:  Wt Readings from Last 3 Encounters:  02/23/23 157 lb (71.2 kg)  10/29/22 157 lb (71.2 kg)  08/15/22 150 lb 2 oz (68.1 kg)   Temp Readings from Last 3 Encounters:  03/01/23 98.9 F (37.2 C) (Oral)  10/29/22 97.9 F (36.6 C)  08/15/22 98.5 F (36.9 C) (Oral)   BP Readings from Last 3 Encounters:  03/01/23 107/74  10/29/22 (!) 140/86  08/15/22 132/78   Pulse Readings from Last 3 Encounters:  03/01/23 97  10/29/22 88  08/15/22 (!) 108   Pain Assessment Pain Score: 8 /10 Unable to assess given encounter type.   ECOG = 3  0 - Asymptomatic (Fully active, able to carry on all predisease activities without restriction)  1 - Symptomatic but completely ambulatory (Restricted in physically strenuous activity but ambulatory and able to carry out work of a light or sedentary nature. For example, light housework, office work)  2 - Symptomatic, <50% in bed during the day (Ambulatory and capable of all self care but  unable to carry out any work activities. Up and about more than 50% of waking hours)  3 - Symptomatic, >50% in bed, but not bedbound (Capable of only limited self-care, confined to bed or chair 50% or more of waking hours)  4 - Bedbound (Completely disabled. Cannot carry on any self-care. Totally confined to bed or chair)  5 - Death   Santiago Glad MM, Creech RH, Tormey DC, et al. [phone]). "Toxicity and response criteria of the [employer]". Am. Evlyn Clines. Oncol. 5 (6): 649-55    LABORATORY DATA:  Lab Results  Component Value Date   WBC 5.4 03/01/2023   HGB 11.6 (L) 03/01/2023   HCT 34.8 (L) 03/01/2023   MCV 94.8 03/01/2023   PLT 47 (L) 03/01/2023   Lab Results  Component Value Date   NA 134 (L) 03/01/2023   K 3.4 (L) 03/01/2023   CL 100 03/01/2023   CO2 23 03/01/2023   Lab Results  Component Value Date   ALT 28 02/25/2023   AST 44 (H) 02/25/2023   ALKPHOS 161 (H) 02/25/2023   BILITOT 0.7 02/25/2023      RADIOGRAPHY: IR US CHEST  Result Date: 02/28/2023 CLINICAL DATA:  Evaluate for thoracentesis. EXAM: CHEST ULTRASOUND COMPARISON:  Chest radiograph 02/28/2023 FINDINGS: Small amount of right pleural fluid was identified. No percutaneous window for thoracentesis due to a flap of lung. Thoracentesis not performed. IMPRESSION: Small right pleural effusion. Thoracentesis not performed due to lack of a safe percutaneous window. Electronically Signed   By: Richarda Overlie M.D.   On: 02/28/2023 16:10   NM Bone Scan Whole Body  Result Date: 02/28/2023 CLINICAL DATA:  Lung cancer EXAM: NUCLEAR MEDICINE WHOLE BODY BONE SCAN TECHNIQUE: Whole body anterior and posterior images were obtained approximately 3 hours after intravenous injection of radiopharmaceutical. RADIOPHARMACEUTICALS:  21.0 mCi Technetium-54m MDP IV COMPARISON:  CT scan earlier June 2024 of multiple areas. FINDINGS: Physiologic distribution of radiotracer overall. There is some uptake involving the right midfoot  consistent with known provided history  of injury in the past. There is slight asymmetric uptake along the proximal left tibia from the other side. There is also some subtle areas of low uptake along the distal femoral metaphysis. Recommend dedicated x-rays. Mild areas of degenerative uptake such as along the extremities. Of note the kidneys are not as well seen as typical. Please correlate with clinical findings. IMPRESSION: Subtle asymmetric uptake involving the proximal left tibia. Slight low uptake along the distal femurs. Recommend dedicated x-rays if there is no known history. Atypical low uptake of the renal parenchyma. The rest of the findings do not suggest super scan but please correlate with the patient's renal function and other history. This may be technical. Electronically Signed   By: Karen Kays M.D.   On: 02/28/2023 14:40   DG CHEST PORT 1 VIEW  Result Date: 02/28/2023 CLINICAL DATA:  Pleural effusion on right. EXAM: PORTABLE CHEST 1 VIEW COMPARISON:  Radiographs 02/27/2023 and 02/23/2023. Abdominal CT 02/27/2023 and chest CT 02/23/2023. FINDINGS: 0845 hours. Mild patient rotation to the right. Grossly stable heart size and mediastinal contours with known extensive mediastinal lymphadenopathy. There are enlarging right greater than left pleural effusions with increasing atelectasis at both lung bases. No pneumothorax. The bones appear unchanged. Telemetry leads overlie the chest. IMPRESSION: 1. Enlarging right greater than left pleural effusions with increasing bibasilar atelectasis. 2. Grossly stable mediastinal lymphadenopathy from recent chest CT, presumed lung cancer. Electronically Signed   By: Carey Bullocks M.D.   On: 02/28/2023 12:55   MR BRAIN W WO CONTRAST  Result Date: 02/28/2023 CLINICAL DATA:  Staging of small cell lung carcinoma EXAM: MRI HEAD WITHOUT AND WITH CONTRAST TECHNIQUE: Multiplanar, multiecho pulse sequences of the brain and surrounding structures were obtained  without and with intravenous contrast. CONTRAST:  7mL GADAVIST GADOBUTROL 1 MMOL/ML IV SOLN COMPARISON:  None Available. FINDINGS: Brain: No acute infarct, mass effect or extra-axial collection. No acute or chronic hemorrhage. Normal white matter signal. On diffusion-weighted imaging and the FLAIR sequence, there are bilateral convexity signal abnormalities within the subdural space. The midline structures are normal. There is smooth, diffuse pachymeningeal contrast enhancement. There are no intraparenchymal contrast enhancing lesions. Vascular: Major flow voids are preserved. Skull and upper cervical spine: Normal calvarium and skull base. Visualized upper cervical spine and soft tissues are normal. Sinuses/Orbits:Mastoid effusions. Paranasal sinuses are clear. Normal orbits. IMPRESSION: 1. No intraparenchymal metastatic disease. 2. Bilateral convexity signal abnormalities within the subdural space, which may indicate aging subdural blood. 3. Smooth, diffuse pachymeningeal contrast enhancement. 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 Metastatic disease felt less likely given the lack nodularity. Electronically Signed   By: Deatra Robinson M.D.   On: 02/28/2023 02:17   DG CHEST PORT 1 VIEW  Result Date: 02/27/2023 CLINICAL DATA:  Shortness of breath. EXAM: PORTABLE CHEST 1 VIEW COMPARISON:  February 23, 2023. FINDINGS: Stable cardiomegaly. Mild central pulmonary vascular congestion is noted. Minimal bibasilar pulmonary edema is noted with small right pleural effusion. Bony thorax is unremarkable. IMPRESSION: Stable cardiomegaly with mild central pulmonary vascular congestion. Minimal bibasilar pulmonary edema is noted with probable small right pleural effusion. Electronically Signed   By: Lupita Raider M.D.   On: 02/27/2023 16:27   CT ABDOMEN PELVIS W CONTRAST  Result Date: 02/27/2023 CLINICAL  DATA:  Non-small-cell lung cancer. Staging. * Tracking Code: BO * EXAM: CT ABDOMEN AND PELVIS WITH CONTRAST TECHNIQUE: Multidetector CT imaging  of the abdomen and pelvis was performed using the standard protocol following bolus administration of intravenous contrast. RADIATION DOSE REDUCTION: This exam was performed according to the departmental dose-optimization program which includes automated exposure control, adjustment of the mA and/or kV according to patient size and/or use of iterative reconstruction technique. CONTRAST:  75mL OMNIPAQUE IOHEXOL 350 MG/ML SOLN COMPARISON:  Chest CTA 02/23/2023 FINDINGS: Lower chest: Bibasilar collapse/consolidation with bilateral pleural effusions, right greater than left. Pleural effusions are progressive since the chest CT 4 days ago. Hepatobiliary: Small area of low attenuation in the anterior liver, adjacent to the falciform ligament, is in a characteristic location for focal fatty deposition. 9 mm hypodensity in the left liver (15/3) is too small to characterize but statistically is likely benign. There is no evidence for gallstones, gallbladder wall thickening, or pericholecystic fluid. No intrahepatic or extrahepatic biliary dilation. Pancreas: No focal mass lesion. No dilatation of the main duct. No intraparenchymal cyst. No peripancreatic edema. Spleen: No splenomegaly. No suspicious focal mass lesion. Adrenals/Urinary Tract: No adrenal nodule or mass. Kidneys unremarkable. No evidence for hydroureter. The urinary bladder appears normal for the degree of distention. Stomach/Bowel: Stomach is distended with food and fluid. Duodenum is normally positioned as is the ligament of Treitz. No small bowel wall thickening. No small bowel dilatation. The terminal ileum is normal. The appendix is normal. No gross colonic mass. No colonic wall thickening. Vascular/Lymphatic: Moderate atherosclerotic calcification is noted in the wall of the aorta. There is no gastrohepatic or  hepatoduodenal ligament lymphadenopathy. No retroperitoneal or mesenteric lymphadenopathy. No pelvic sidewall lymphadenopathy. Reproductive: Unremarkable. Other: No intraperitoneal free fluid. Musculoskeletal: No worrisome lytic or sclerotic osseous abnormality. IMPRESSION: 1. No definite evidence for metastatic disease in the abdomen or pelvis. 2. 9 mm hypodensity in the left liver is too small to characterize but statistically is likely benign. Attention on follow-up recommended. 3. Bibasilar collapse/consolidation with bilateral pleural effusions, right greater than left. Pleural effusions are progressive since the chest CT 4 days ago. 4.  Aortic Atherosclerosis (ICD10-I70.0). Electronically Signed   By: Kennith Center M.D.   On: 02/27/2023 11:47   [place] CORE BIOPSY (SOFT TISSUE)  Result Date: 02/26/2023 INDICATION: Cervical and supraclavicular adenopathy.  No diagnosis. EXAM: ULTRASOUND-GUIDED RIGHT SUPRACLAVICULAR NODAL MASS BIOPSY COMPARISON:  CT neck and chest, 09/24/2022. MEDICATIONS: None ANESTHESIA/SEDATION: Local anesthetic was administered. COMPLICATIONS: None immediate. TECHNIQUE: Informed written consent was obtained from the patient and/or patient's representative after a discussion of the risks, benefits and alternatives to treatment. Questions regarding the procedure were encouraged and answered. Initial ultrasound scanning demonstrated RIGHT supraclavicular nodal mass. An ultrasound image was saved for documentation purposes. The procedure was planned. A timeout was performed prior to the initiation of the procedure. The operative was prepped and draped in the usual sterile fashion, and a sterile drape was applied covering the operative field. A timeout was performed prior to the initiation of the procedure. Local anesthesia was provided with 1% lidocaine with epinephrine. Under direct ultrasound guidance, an 18 gauge core needle device was utilized to obtain to obtain 3 core needle biopsies of the  RIGHT supraclavicular nodal mass. The samples were placed in saline and submitted to pathology. The needle was removed and superficial hemostasis was achieved with manual compression. Post procedure scan was negative for significant hematoma. A dressing was applied. The patient tolerated the procedure well without immediate postprocedural complication. IMPRESSION: Successful core biopsy of RIGHT supraclavicular nodal mass. Roanna Banning, MD [facility] Electronically Signed  By: Roanna Banning M.D.   On: 02/26/2023 10:15   ECHOCARDIOGRAM COMPLETE  Result Date: 02/24/2023    ECHOCARDIOGRAM REPORT   Patient Name:   MEENU FREUND Date of Exam: 02/24/2023 Medical Rec #:  [id number]         Height:       68.0 in Accession #:    [id number]        Weight:       157.0 lb Date of Birth:  11-Dec-1970        BSA:          1.844 m Patient Age:    51 years          BP:           111/73 mmHg Patient Gender: F                 HR:           88 bpm. Exam Location:  Inpatient Procedure: 2D Echo, Cardiac Doppler and Color Doppler Indications:    A-FIB  History:        Patient has no prior history of Echocardiogram examinations.                 Arrythmias:Atrial Fibrillation; Risk Factors:Current Smoker.  Sonographer:    Darlys Gales Referring Phys: [phone] JACOB J STINSON IMPRESSIONS  1. Left ventricular ejection fraction, by estimation, is 60 to 65%. The left ventricle has normal function. The left ventricle has no regional wall motion abnormalities. Left ventricular diastolic parameters are indeterminate.  2. Right ventricular systolic function is normal. The right ventricular size is normal. Tricuspid regurgitation signal is inadequate for assessing PA pressure.  3. The mitral valve is normal in structure. No evidence of mitral valve regurgitation.  4. The aortic valve is grossly normal. Aortic valve regurgitation is not visualized.  5. The inferior vena cava is dilated in  size with <50% respiratory variability, suggesting right atrial pressure of 15 mmHg. FINDINGS  Left Ventricle: Left ventricular ejection fraction, by estimation, is 60 to 65%. The left ventricle has normal function. The left ventricle has no regional wall motion abnormalities. The left ventricular internal cavity size was normal in size. There is  no left ventricular hypertrophy. Left ventricular diastolic parameters are indeterminate. Right Ventricle: The right ventricular size is normal. Right ventricular systolic function is normal. Tricuspid regurgitation signal is inadequate for assessing PA pressure. Left Atrium: Left atrial size was normal in size. Right Atrium: Right atrial size was normal in size. Pericardium: There is no evidence of pericardial effusion. Mitral Valve: The mitral valve is normal in structure. No evidence of mitral valve regurgitation. Tricuspid Valve: Tricuspid valve regurgitation is not demonstrated. Aortic Valve: The aortic valve is grossly normal. Aortic valve regurgitation is not visualized. Aortic valve mean gradient measures 5.0 mmHg. Aortic valve peak gradient measures 7.6 mmHg. Aortic valve area, by VTI measures 2.82 cm. Pulmonic Valve: Pulmonic valve regurgitation is not visualized. Aorta: The aortic root and ascending aorta are structurally normal, with no evidence of dilitation. Venous: The inferior vena cava is dilated in size with less than 50% respiratory variability, suggesting right atrial pressure of 15 mmHg. IAS/Shunts: The interatrial septum was not well visualized.  LEFT VENTRICLE PLAX 2D LVIDd:         5.10 cm   Diastology LVIDs:         3.00 cm   LV e' medial:    7.07 cm/s LV PW:  0.80 cm   LV E/e' medial:  12.3 LV IVS:        0.70 cm   LV e' lateral:   10.20 cm/s LVOT diam:     1.80 cm   LV E/e' lateral: 8.5 LV SV:         76 LV SV Index:   41 LVOT Area:     2.54 cm  RIGHT VENTRICLE             IVC RV S prime:     15.10 cm/s  IVC diam: 2.50 cm TAPSE (M-mode):  2.5 cm LEFT ATRIUM             Index        RIGHT ATRIUM           Index LA Vol (A2C):   21.7 ml 11.77 ml/m  RA Area:     14.00 cm LA Vol (A4C):   27.3 ml 14.81 ml/m  RA Volume:   34.60 ml  18.76 ml/m LA Biplane Vol: 24.7 ml 13.40 ml/m  AORTIC VALVE AV Area (Vmax):    2.42 cm AV Area (Vmean):   2.26 cm AV Area (VTI):     2.82 cm AV Vmax:           138.00 cm/s AV Vmean:          102.000 cm/s AV VTI:            0.269 m AV Peak Grad:      7.6 mmHg AV Mean Grad:      5.0 mmHg LVOT Vmax:         131.00 cm/s LVOT Vmean:        90.500 cm/s LVOT VTI:          0.298 m LVOT/AV VTI ratio: 1.11  AORTA Ao Root diam: 3.20 cm Ao Asc diam:  3.50 cm MITRAL VALVE MV Area (PHT): 5.23 cm     SHUNTS MV Decel Time: 145 msec     Systemic VTI:  0.30 m MV E velocity: 87.00 cm/s   Systemic Diam: 1.80 cm MV A velocity: 102.00 cm/s MV E/A ratio:  0.85 Photographer signed by Carolan Clines Signature Date/Time: 02/24/2023/3:44:27 PM    Final    CT Angio Chest PE W and/or Wo Contrast  Result Date: 02/23/2023 CLINICAL DATA:  Shortness of breath, right neck mass, chest mass, headache * Tracking Code: BO * EXAM: CT ANGIOGRAPHY CHEST WITH CONTRAST TECHNIQUE: Multidetector CT imaging of the chest was performed using the standard protocol during bolus administration of intravenous contrast. Multiplanar CT image reconstructions and MIPs were obtained to evaluate the vascular anatomy. RADIATION DOSE REDUCTION: This exam was performed according to the departmental dose-optimization program which includes automated exposure control, adjustment of the mA and/or kV according to patient size and/or use of iterative reconstruction technique. CONTRAST:  75mL OMNIPAQUE IOHEXOL 350 MG/ML SOLN COMPARISON:  None Available. FINDINGS: Cardiovascular: No filling defect is identified in the pulmonary arterial tree to suggest pulmonary embolus. Mild atheromatous vascular calcification of the aortic arch. The large mediastinal mass bulges into the  SVC posteriorly for example on image 128 series 4, probably from extrinsic compression, definite invasion is not observed. Because of lack of complete opacification from pulmonary arterial timing, patency of the left brachiocephalic vein is uncertain. Moderate pericardial effusion. Mediastinum/Nodes: Large right infrahilar and hilar mass extending into the mediastinum and tracking all the way to the thoracic inlet. This extends around the right mainstem bronchus,  right upper lobe bronchus, and bronchus intermedius as well as the right lower lobe and right middle lobe bronchi, and likewise surrounds and narrows the right pulmonary artery and its branches. In the subcarinal region, the conglomerate mass measures about 7.9 by 5.6 cm (image 144, series 4) and the mass flattens and narrows the SVC without overt occlusion. In the right infrahilar region, the observed mass measures 4.7 by 3.9 cm on image 179 series 4. Presumed lymph node anterior to the SVC in the upper chest measures 2.6 cm in short axis on image 36 series 3. A right lower neck level IV mass/lymph node measures 3.4 cm in short axis on image 10 series 3. Lungs/Pleura: The mass in the right chest displaces the upper trachea to the left. There is substantial narrowing of the right upper lobe bronchus, right middle lobe bronchus, and severe narrowing of the right lower lobe bronchus due to the surrounding mass. Patchy regions of nodularity in the right lower lobe distal to the mass may represent postobstructive pneumonitis or satellite tumor nodules. Centrilobular emphysema. Scarring or atelectasis in the right middle lobe and right upper lobe anteriorly. Mild dependent atelectasis in both lower lobes. Trace right pleural effusion, image 50 series 3. Upper Abdomen: Small nonspecific hypodense lesion in the left hepatic lobe 0.9 cm in long axis on image 96 series 3. Musculoskeletal: Unremarkable Review of the MIP images confirms the above findings. IMPRESSION:  1. Large right infrahilar and hilar mass extending into the mediastinum and tracking all the way to the thoracic inlet. This extends around the right mainstem bronchus, right upper lobe bronchus, and bronchus intermedius as well as the right lower lobe and right middle lobe bronchi, and likewise surrounds and narrows the right pulmonary artery and its branches. The mass flattens and narrows the SVC without overt occlusion, and is confluent with bulky right level IV neck adenopathy. Top differential diagnostic considerations include lung cancer (such as squamous cell or small cell) versus extensive thoracic and right lower neck lymphoma. 2. Patchy regions of nodularity in the right lower lobe distal to the mass may represent postobstructive pneumonitis or satellite tumor nodules. 3. Presumed lymph node anterior to the SVC in the upper chest measures 2.6 cm in short axis. 4. A right lower neck level IV mass/lymph node measures 3.4 cm in short axis, compatible with malignancy. 5. Moderate pericardial effusion. 6. Trace right pleural effusion. 7. Centrilobular emphysema. 8. Small nonspecific hypodense lesion in the left hepatic lobe 0.9 cm in long axis. 9. No pulmonary embolus is identified. 10. Aortic atherosclerosis. Aortic Atherosclerosis (ICD10-I70.0) and Emphysema (ICD10-J43.9). Electronically Signed   By: Gaylyn Rong M.D.   On: 02/23/2023 13:39   CT Soft Tissue Neck W Contrast  Result Date: 02/23/2023 CLINICAL DATA:  Neck mass, nonpulsatile right sided neck mass EXAM: CT NECK WITH CONTRAST TECHNIQUE: Multidetector CT imaging of the neck was performed using the standard protocol following the bolus administration of intravenous contrast. RADIATION DOSE REDUCTION: This exam was performed according to the departmental dose-optimization program which includes automated exposure control, adjustment of the mA and/or kV according to patient size and/or use of iterative reconstruction technique. CONTRAST:  75mL  OMNIPAQUE IOHEXOL 350 MG/ML SOLN COMPARISON:  None Available. FINDINGS: Pharynx and larynx: Normal. No mass or swelling. Salivary glands: No inflammation, mass, or stone. Thyroid: Normal. Lymph nodes: There is bulky lower cervical and mediastinal lymphadenopathy involving the right level 3 and level 4 lymph node stations, the right supraclavicular region, and the anterior and  posterior mediastinum. Cervical lymphadenopathy significant mass effect on the right internal jugular vein (series 5, image 68). Vascular: Negative. Mass effect on the right internal jugular vein, as above Limited intracranial: Negative. Visualized orbits: Negative. Mastoids and visualized paranasal sinuses: Moderate left-sided mastoid effusion. There is pneumatization of the petrous apices with air-fluid levels. Paranasal sinuses are clear. Orbits are unremarkable. Skeleton: No acute or aggressive process. Upper chest: Mild paraseptal emphysema. See same day CT chest for additional findings Other: None. IMPRESSION: 1. Bulky right lower cervical and mediastinal lymphadenopathy, concerning for metastatic disease or lymphoma. 2. See separate CT chest for additional findings. Emphysema (ICD10-J43.9). Electronically Signed   By: Lorenza Cambridge M.D.   On: 02/23/2023 13:17   CT Head Wo Contrast  Result Date: 02/23/2023 CLINICAL DATA:  Headache EXAM: CT HEAD WITHOUT CONTRAST TECHNIQUE: Contiguous axial images were obtained from the base of the skull through the vertex without intravenous contrast. RADIATION DOSE REDUCTION: This exam was performed according to the departmental dose-optimization program which includes automated exposure control, adjustment of the mA and/or kV according to patient size and/or use of iterative reconstruction technique. COMPARISON:  None Available. FINDINGS: Brain: There is no acute intracranial hemorrhage, extra-axial fluid collection, or acute infarct Parenchymal volume is normal. The ventricles are normal in size.  Gray-white differentiation is preserved The pituitary and suprasellar region are normal. There is no mass lesion. There is no mass effect or midline shift. Vascular: No hyperdense vessel or unexpected calcification. Skull: Normal. Negative for fracture or focal lesion. Sinuses/Orbits: The imaged paranasal sinuses are clear. The globes and orbits are unremarkable. Other: There are small bilateral mastoid effusions. IMPRESSION: 1. No acute intracranial pathology. 2. Small bilateral mastoid effusions. Electronically Signed   By: Lesia Hausen M.D.   On: 02/23/2023 13:15   DG Chest Port 1 View  Result Date: 02/23/2023 CLINICAL DATA:  Sepsis EXAM: PORTABLE CHEST 1 VIEW COMPARISON:  X-ray 01/09/2023 FINDINGS: No pneumothorax, effusion or edema. Calcified aorta. Overlapping cardiac leads. There is new widening of the mediastinum compared to prior x-ray and fullness of the right lung hilum. Recommend further evaluation with CT to delineate for potential mass. IMPRESSION: New widening of the mediastinum with masslike fullness in the right lung hilum. Recommend follow up contrast CT to further delineate Electronically Signed   By: Karen Kays M.D.   On: 02/23/2023 13:11       IMPRESSION/PLAN: 1. At least limited stage small cell lung cancer arising in the right hilum.  Today we spent time discussing the findings on the imaging test, her workup today, and the concern for at least locally advanced lung cancer.  She will be undergoing additional testing to rule out metastatic disease.  She is aware that if her bone marrow does show malignant cells, treatment courses may be palliative rather than her hopes for offering definitive treatment however at this time we discussed the rationale for chemoradiation.  There is a sense of urgency about her tumor histology and risks of progressive changes of the tumor that is compromising her vasculature and airways.  We discussed the rationale for [facility] over to [employer] for simulation today.  She will still proceed with Port-A-Cath placement and bone marrow biopsy tomorrow at [facility], and we have discussed with her hospitalist team about transferring to [facility] long so she can remain on campus to start radiation tomorrow and if needed through the weekend depending on her pulmonary status.  We would prefer that she remain hospitalized through the weekend  to ensure that she does not have rebound edema from treatment impacting her vena cava or airway.  She is in agreement with this plan.  We discussed the risks, benefits, short and long-term effects of radiotherapy and the discussion of 6-1/2 weeks of treatment to the right chest and regional lymph nodes extending from the mediastinum to the lower cervical chains.  She will come at 230 this afternoon to sign written consent and proceed with simulation.  We anticipate starting treatment tomorrow after her procedures in radiology.  She is also aware of the need for concurrent chemotherapy.  Dr. Mosetta Putt is discussing options of starting treatment while she is inpatient.  The patient is interested in remaining in [place] for her treatment. 2. Code Status. We discussed the patient's desires for remaining full code she is aware of what this means and the sense of urgency that is felt about her situation if she were to decompensate.   In a visit lasting 60 minutes, greater than 50% of the time was spent by phone and in floor time discussing the patient's condition, in preparation for the discussion, and coordinating the patient's care.       Osker Mason, [facility]   **Disclaimer: This note was dictated with voice recognition software. Similar sounding words can inadvertently be transcribed and this note may contain transcription errors which may not have been corrected upon publication of note.**

## 2023-03-01 NOTE — Progress Notes (Addendum)
Ana Curry   DOB:Jul 25, 1971   ZO#:[id number]   JWJ#:[id number]  Oncology follow up   Subjective: Patient had onset of nausea, vomiting and diarrhea this morning.  She was brought to our cancer center this afternoon for simulation, to start urgent radiation tomorrow.  She was quite exhausted when I saw her in late afternoon.   Objective:  Vitals:   03/01/23 2008 03/01/23 2152  BP: 98/67 104/68  Pulse: 96 89  Resp: 16 18  Temp: 98.2 F (36.8 C)   SpO2: 94% 92%    Body mass index is 23.87 kg/m. No intake or output data in the 24 hours ending 03/01/23 2252    Sclerae unicteric  Oropharynx clear  Large right low neck mass   MSK no focal spinal tenderness, no peripheral edema  Neuro nonfocal  CBG (last 3)  No results for input(s): "GLUCAP" in the last 72 hours.   Labs:  Urine Studies No results for input(s): "UHGB", "CRYS" in the last 72 hours.  Invalid input(s): "UACOL", "UAPR", "USPG", "UPH", "UTP", "UGL", "UKET", "UBIL", "UNIT", "UROB", "ULEU", "UEPI", "UWBC", "URBC", "UBAC", "CAST", "UCOM", "BILUA"  Basic Metabolic Panel: Recent Labs  Lab 02/24/23 0109 02/25/23 0142 02/26/23 0151 02/28/23 0547 03/01/23 1050  NA 134* 138 138 137 134*  K 4.1 3.7 3.8 3.2* 3.4*  CL 102 104 104 101 100  CO2 19* 20* 20* 21* 23  GLUCOSE 165* 142* 132* 112* 107*  BUN 14 14 17 16 19   CREATININE 1.16* 1.06* 1.09* 1.04* 0.96  CALCIUM 9.6 9.6 9.4 8.9 9.3   GFR Estimated Creatinine Clearance: 69.2 mL/min (by C-G formula based on SCr of 0.96 mg/dL). Liver Function Tests: Recent Labs  Lab 02/23/23 1128 02/24/23 0109 02/25/23 0142  AST 58* 52* 44*  ALT 27 27 28   ALKPHOS 203* 177* 161*  BILITOT 0.9 0.6 0.7  PROT 7.3 6.3* 6.2*  ALBUMIN 4.0 3.5 3.4*   No results for input(s): "LIPASE", "AMYLASE" in the last 168 hours. No results for input(s): "AMMONIA" in the last 168 hours. Coagulation profile Recent Labs  Lab 02/23/23 1128 02/24/23 1014  INR 1.1 1.1    CBC: Recent  Labs  Lab 02/23/23 1128 02/24/23 0109 02/24/23 1014 02/25/23 0142 02/26/23 0151 02/28/23 0639 03/01/23 1050  WBC 8.0 8.9  --  9.7 8.1 7.0 5.4  NEUTROABS 6.0  --   --   --   --   --   --   HGB 13.8 11.8*  --  11.0* 10.6* 11.7* 11.6*  HCT 39.9 34.2*  --  32.9* 31.2* 33.5* 34.8*  MCV 95.0 95.3  --  95.4 96.6 94.9 94.8  PLT 50* 44* 46* 45* 44* 51* 47*   Cardiac Enzymes: No results for input(s): "CKTOTAL", "CKMB", "CKMBINDEX", "TROPONINI" in the last 168 hours. BNP: Invalid input(s): "POCBNP" CBG: No results for input(s): "GLUCAP" in the last 168 hours. D-Dimer No results for input(s): "DDIMER" in the last 72 hours. Hgb A1c No results for input(s): "HGBA1C" in the last 72 hours. Lipid Profile No results for input(s): "CHOL", "HDL", "LDLCALC", "TRIG", "CHOLHDL", "LDLDIRECT" in the last 72 hours. Thyroid function studies No results for input(s): "TSH", "T4TOTAL", "T3FREE", "THYROIDAB" in the last 72 hours.  Invalid input(s): "FREET3" Anemia work up No results for input(s): "VITAMINB12", "FOLATE", "FERRITIN", "TIBC", "IRON", "RETICCTPCT" in the last 72 hours. Microbiology Recent Results (from the past 240 hour(s))  Culture, blood (Routine x 2)     Status: None   Collection Time: 02/23/23 11:28 AM  Specimen: BLOOD  Result Value Ref Range Status   Specimen Description BLOOD BLOOD RIGHT ARM  Final   Special Requests   Final    BOTTLES DRAWN AEROBIC AND ANAEROBIC Blood Culture adequate volume   Culture   Final    NO GROWTH 5 DAYS Performed at [facility], [address], [place], [place] [address]    Report Status 02/28/2023 FINAL  Final  Culture, blood (Routine x 2)     Status: None (Preliminary result)   Collection Time: 02/23/23 11:29 AM   Specimen: Left Antecubital; Blood  Result Value Ref Range Status   Specimen Description   Final    LEFT ANTECUBITAL BLOOD Performed at [facility], [address], [place], [place] [address]    Special Requests   Final     BOTTLES DRAWN AEROBIC AND ANAEROBIC Blood Culture adequate volume Performed at [facility], [address], [place], [place] [address]    Culture  Setup Time   Final    AEROBIC BOTTLE ONLY GRAM POSITIVE COCCI Gram Stain Report Called to,Read Back By and Verified With: WEST,MIKE ([place]) @ 1105 ON 02/27/2023 BY FRATTO,ASHLEY CRITICAL RESULT CALLED TO, READ BACK BY AND VERIFIED WITH: PHARMD L. CHEN [id number] @ 1840 FH    Culture   Final    GRAM POSITIVE COCCI CULTURE REINCUBATED FOR BETTER GROWTH Performed at [facility], [address], [place], [place] [address]    Report Status PENDING  Incomplete  Blood Culture ID Panel (Reflexed)     Status: None   Collection Time: 02/23/23 11:29 AM  Result Value Ref Range Status   Enterococcus faecalis NOT DETECTED NOT DETECTED Final   Enterococcus Faecium NOT DETECTED NOT DETECTED Final   Listeria monocytogenes NOT DETECTED NOT DETECTED Final   Staphylococcus species NOT DETECTED NOT DETECTED Final   Staphylococcus aureus (BCID) NOT DETECTED NOT DETECTED Final   Staphylococcus epidermidis NOT DETECTED NOT DETECTED Final   Staphylococcus lugdunensis NOT DETECTED NOT DETECTED Final   Streptococcus species NOT DETECTED NOT DETECTED Final   Streptococcus agalactiae NOT DETECTED NOT DETECTED Final   Streptococcus pneumoniae NOT DETECTED NOT DETECTED Final   Streptococcus pyogenes NOT DETECTED NOT DETECTED Final   A.calcoaceticus-baumannii NOT DETECTED NOT DETECTED Final   Bacteroides fragilis NOT DETECTED NOT DETECTED Final   Enterobacterales NOT DETECTED NOT DETECTED Final   Enterobacter cloacae complex NOT DETECTED NOT DETECTED Final   Escherichia coli NOT DETECTED NOT DETECTED Final   Klebsiella aerogenes NOT DETECTED NOT DETECTED Final   Klebsiella oxytoca NOT DETECTED NOT DETECTED Final   Klebsiella pneumoniae NOT DETECTED NOT DETECTED Final   Proteus species NOT DETECTED NOT DETECTED Final   Salmonella species NOT DETECTED NOT  DETECTED Final   Serratia marcescens NOT DETECTED NOT DETECTED Final   Haemophilus influenzae NOT DETECTED NOT DETECTED Final   Neisseria meningitidis NOT DETECTED NOT DETECTED Final   Pseudomonas aeruginosa NOT DETECTED NOT DETECTED Final   Stenotrophomonas maltophilia NOT DETECTED NOT DETECTED Final   Candida albicans NOT DETECTED NOT DETECTED Final   Candida auris NOT DETECTED NOT DETECTED Final   Candida glabrata NOT DETECTED NOT DETECTED Final   Candida krusei NOT DETECTED NOT DETECTED Final   Candida parapsilosis NOT DETECTED NOT DETECTED Final   Candida tropicalis NOT DETECTED NOT DETECTED Final   Cryptococcus neoformans/gattii NOT DETECTED NOT DETECTED Final    Comment: Performed at [facility], [address], [place], [place] [address]  Culture, blood (Routine X  2) w Reflex to ID Panel     Status: None (Preliminary result)   Collection Time: 02/27/23 12:52 PM   Specimen: BLOOD RIGHT HAND  Result Value Ref Range Status   Specimen Description BLOOD RIGHT HAND  Final   Special Requests   Final    BOTTLES DRAWN AEROBIC AND ANAEROBIC Blood Culture adequate volume   Culture   Final    NO GROWTH 2 DAYS Performed at [facility], [address] [place], [place] [address]    Report Status PENDING  Incomplete  Culture, blood (Routine X 2) w Reflex to ID Panel     Status: None (Preliminary result)   Collection Time: 02/27/23 12:53 PM   Specimen: BLOOD  Result Value Ref Range Status   Specimen Description BLOOD SITE NOT SPECIFIED  Final   Special Requests   Final    BOTTLES DRAWN AEROBIC AND ANAEROBIC Blood Culture adequate volume   Culture   Final    NO GROWTH 2 DAYS Performed at [facility], [address], [place], [place] [address]    Report Status PENDING  Incomplete      Studies:  IR US CHEST  Result Date: 02/28/2023 CLINICAL DATA:  Evaluate for thoracentesis. EXAM: CHEST ULTRASOUND COMPARISON:  Chest radiograph 02/28/2023 FINDINGS: Small amount  of right pleural fluid was identified. No percutaneous window for thoracentesis due to a flap of lung. Thoracentesis not performed. IMPRESSION: Small right pleural effusion. Thoracentesis not performed due to lack of a safe percutaneous window. Electronically Signed   By: Richarda Overlie M.D.   On: 02/28/2023 16:10   NM Bone Scan Whole Body  Result Date: 02/28/2023 CLINICAL DATA:  Lung cancer EXAM: NUCLEAR MEDICINE WHOLE BODY BONE SCAN TECHNIQUE: Whole body anterior and posterior images were obtained approximately 3 hours after intravenous injection of radiopharmaceutical. RADIOPHARMACEUTICALS:  21.0 mCi Technetium-33m MDP IV COMPARISON:  CT scan earlier June 2024 of multiple areas. FINDINGS: Physiologic distribution of radiotracer overall. There is some uptake involving the right midfoot consistent with known provided history of injury in the past. There is slight asymmetric uptake along the proximal left tibia from the other side. There is also some subtle areas of low uptake along the distal femoral metaphysis. Recommend dedicated x-rays. Mild areas of degenerative uptake such as along the extremities. Of note the kidneys are not as well seen as typical. Please correlate with clinical findings. IMPRESSION: Subtle asymmetric uptake involving the proximal left tibia. Slight low uptake along the distal femurs. Recommend dedicated x-rays if there is no known history. Atypical low uptake of the renal parenchyma. The rest of the findings do not suggest super scan but please correlate with the patient's renal function and other history. This may be technical. Electronically Signed   By: Karen Kays M.D.   On: 02/28/2023 14:40   DG CHEST PORT 1 VIEW  Result Date: 02/28/2023 CLINICAL DATA:  Pleural effusion on right. EXAM: PORTABLE CHEST 1 VIEW COMPARISON:  Radiographs 02/27/2023 and 02/23/2023. Abdominal CT 02/27/2023 and chest CT 02/23/2023. FINDINGS: 0845 hours. Mild patient rotation to the right. Grossly stable  heart size and mediastinal contours with known extensive mediastinal lymphadenopathy. There are enlarging right greater than left pleural effusions with increasing atelectasis at both lung bases. No pneumothorax. The bones appear unchanged. Telemetry leads overlie the chest. IMPRESSION: 1. Enlarging right greater than left pleural effusions with increasing bibasilar atelectasis. 2. Grossly stable mediastinal lymphadenopathy from recent chest CT, presumed lung cancer. Electronically Signed   By:  Carey Bullocks M.D.   On: 02/28/2023 12:55    Assessment: 52 y.o.  Right small cell lung cancer, stage IV with cervical node metastasis, indeterminate right pleural effusion  moderate thrombocytopenia, likely DIC vs bone marrow involvement by malignancy Tumor lysis syndrome, resolved now  Right pleural effusion     Plan:  -Case was reviewed with radiation oncologist Dr. Mitzi Hansen this morning, he recommends urgent radiation.  Patient had simulation this afternoon, and plan to start radiation tomorrow. -She is scheduled for bone marrow biopsy and port placement by IR tomorrow at [facility] -I recommended concurrent chemotherapy with carboplatin on day 1 etoposide on day 1-3, every 21 days, to start tomorrow with concurrent radiation. --Chemotherapy consent: Side effects including but does not not limited to, fatigue, nausea, vomiting, diarrhea, hair loss, neuropathy, fluid retention, renal and kidney dysfunction, neutropenic fever, needed for blood transfusion, bleeding, were discussed with patient in great detail. She agrees to proceed. -The goal of therapy is curative, unless her bone marrow biopsy shows metastatic disease in bone marrow. -I will plan to start immunotherapy durvalumab in office next week on 6/25, this is scheduled  -we discussed the logistics in detail -plan to transfer pt to [facility] (ideally [facility]) tomorrow after procedure, and start chemo and radiation tomorrow afternoon. I have informed  inpt chemo team.  -we will f/u daily  -OK to discharge home after she completes chemo on Sunday if she is stable  -Dr. Bertis Ruddy will cover me this weekend   I spent a total of 35 mns for her visit today   Malachy Mood, MD 03/01/2023

## 2023-03-01 NOTE — Plan of Care (Signed)
  Problem: Education: Goal: Knowledge of General Education information will improve Description: Including pain rating scale, medication(s)/side effects and non-pharmacologic comfort measures Outcome: Progressing   Problem: Health Behavior/Discharge Planning: Goal: Ability to manage health-related needs will improve Outcome: Progressing   Problem: Clinical Measurements: Goal: Ability to maintain clinical measurements within normal limits will improve Outcome: Progressing Goal: Will remain free from infection Outcome: Progressing Goal: Diagnostic test results will improve Outcome: Progressing Goal: Respiratory complications will improve Outcome: Progressing Goal: Cardiovascular complication will be avoided Outcome: Progressing   Problem: Activity: Goal: Risk for activity intolerance will decrease Outcome: Progressing   Problem: Nutrition: Goal: Adequate nutrition will be maintained Outcome: Progressing   Problem: Coping: Goal: Level of anxiety will decrease Outcome: Progressing   Problem: Elimination: Goal: Will not experience complications related to bowel motility Outcome: Progressing Goal: Will not experience complications related to urinary retention Outcome: Progressing   Problem: Pain Managment: Goal: General experience of comfort will improve Outcome: Progressing   Problem: Safety: Goal: Ability to remain free from injury will improve Outcome: Progressing   Problem: Skin Integrity: Goal: Risk for impaired skin integrity will decrease Outcome: Progressing   Problem: Education: Goal: Knowledge of disease or condition will improve Outcome: Progressing Goal: Knowledge of the prescribed therapeutic regimen will improve Outcome: Progressing Goal: Individualized Educational Video(s) Outcome: Progressing   Problem: Activity: Goal: Ability to tolerate increased activity will improve Outcome: Progressing Goal: Will verbalize the importance of balancing  activity with adequate rest periods Outcome: Progressing   Problem: Respiratory: Goal: Ability to maintain a clear airway will improve Outcome: Progressing Goal: Levels of oxygenation will improve Outcome: Progressing Goal: Ability to maintain adequate ventilation will improve Outcome: Progressing   Problem: Education: Goal: Knowledge of disease or condition will improve Outcome: Progressing Goal: Understanding of medication regimen will improve Outcome: Progressing Goal: Individualized Educational Video(s) Outcome: Progressing   Problem: Activity: Goal: Ability to tolerate increased activity will improve Outcome: Progressing   Problem: Cardiac: Goal: Ability to achieve and maintain adequate cardiopulmonary perfusion will improve Outcome: Progressing   Problem: Health Behavior/Discharge Planning: Goal: Ability to safely manage health-related needs after discharge will improve Outcome: Progressing   

## 2023-03-01 NOTE — Progress Notes (Addendum)
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  Patient was seen by her primary care physician couple of times and was given antibiotics and was also diagnosed with pneumonia but she had no wheezing, coughing.  Recently noted to have right neck mass.  On presentation,she was hypoxic and requiring supplemental oxygen.  CT chest showed right infrahilar/hilar mass that extends around the right mainstem bronchus, the right upper lobe and all the way to the thoracic inlet with bulky adenopathy.  EKG showed A-fib with RVR.  Given iv  Cardizem.  Lab work showed potassium 3.2, alkaline phosphatase of 203, lactate of 3,  Platelets of 50.  Cardiology, PCCM,oncology consulted.  S/P  IR guided cervical node biopsy on 6/17.  Biopsy confirmed small cell carcinoma.  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  Radiation oncology planning to start on radiation treatment after simulation today.  Plan for Port-A-Cath placement and bone marrow biopsy tomorrow  Assessment & Plan:  Principal Problem:   Lung mass Active Problems:   Atrial fibrillation with RVR (HCC)   Hypokalemia   Acute respiratory failure with hypoxia (HCC)   Thrombocytopenia (HCC)   COPD with acute exacerbation (HCC)   Lactic acidosis   Small cell carcinoma of lung, right (HCC)   Small cell carcinoma of right lung: Presented with shortness of breath.  Imaging showed  right infrahilar/hilar mass that extends around the right mainstem bronchus, the right upper lobe and all the way to the thoracic inlet with bulky adenopathy, likely lung cancer but could be lymphoma too.  Family history of lymphoma.  Case was also discussed with cardiothoracic surgery, not a candidate for surgery.   PCCM/oncology consulted.  Status post  cervical lymph node biopsy.   LDH, uric acid elevated with concern for tumor lysis syndrome. Treated with IV fluid, allopurinol CT abdomen/pelvis with for staging purpose did not show any clear metastatic disease. MRI of the brain did not show any metastatic disease.Biopsy confirmed small cell carcinoma.  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  Radiation oncology planning to start on radiation treatment after simulation today.  Plan for Port-A-Cath placement and bone marrow biopsy tomorrow  Acute respiratory failure with hypoxia: Likely secondary to right lung mass/concomitant COPD/pleural effusion.    CT abdomen/pelvis done showed bilateral lower lung collapse/consolidations, pleural effusion.  CXR showed bilateral pleural effusion more than right.  Thoracentesis attempted but not enough fluid to be removed.  She qualified for oxygen for 3 L at home.  COPD: No history of COPD in the past.  Presented with wheezing, shortness of breath.  Patient is a smoker.  Started on bronchodilators.  Currently on 2-3 of oxygen per minute.  She has mild wheezing on auscultation.  She will need home oxygen  Gram-positive bacteremia: 1 set of blood culture sent on [facility] has shown gram-positive cocci in aerobic bottle.  We are repeating blood cultures.  She is not septic.  Afebrile, no leukocytosis. Normal procalcitonin.    Recent echo has not shown any vegetation.  Repeat blood cultures have not shown any growth.  This is most likely contamination ,continue to hold antibiotics  A-fib with RVR: New onset.  Initially started on Cardizem drip, amiodarone drip.  CHA2DS2-VASc score low,doesnot   not need anticoagulation.  Patient also has severe thrombocytopenia.  TSH normal.  2D echo showed normal EF, no wall motion abnormality.. She converted to  normal sinus rhythm .  Amiodarone changed to oral.  Lactic  acidosis: On gentle IV fluids, I do not think her elevated lactate level is from sepsis  Thrombocytopenia: No bleeding.  Likely associated with malignancy/DIC. Oncology/hematology following   Hypokalemia: Monitor and being supplemented as needed  Tobacco use: Counseled cessation.  Has history of 50+ pack year of smoking.         DVT prophylaxis:SCDs Start: 02/23/23 2132     Code Status: Full Code  Family Communication: Father at the bedside on 6/17  Patient status:Inpatient  Patient is from :home  Anticipated discharge ZO:[id number]  Estimated DC date: In next  1 to 2 days, depends upon plan from oncology   Consultants: PCCM,cardiology, oncology  Procedures: Cervical lymph node biopsy  Antimicrobials:  Anti-infectives (From admission, onward)    None       Subjective: Patient seen and examined at bedside today.  On 2 to 3 L of oxygen.  Not in respiratory distress.  As always, she talks about going home.  I again had a long discussion at the bedside about the current plan and management  Objective: Vitals:   03/01/23 0808 03/01/23 0818 03/01/23 0835 03/01/23 0907  BP: 99/60   107/74  Pulse: 95 96 92 97  Resp: (!) 27 19 18    Temp: 98.9 F (37.2 C)     TempSrc: Oral     SpO2: 90% 90% 92%   Weight:      Height:        Intake/Output Summary (Last 24 hours) at 03/01/2023 1125 Last data filed at 02/28/2023 2200 Gross per 24 hour  Intake 240 ml  Output --  Net 240 ml   Filed Weights   02/23/23 1108  Weight: 71.2 kg    Examination:   General exam: Overall comfortable, not in distress HEENT: PERRL Respiratory system: Mildly diminished sounds bilaterally, mild expiratory wheezing/rhonchi Cardiovascular system: S1 & S2 heard, RRR.  Gastrointestinal system: Abdomen is nondistended, soft and nontender. Central nervous system: Alert and oriented Extremities: No edema, no clubbing ,no cyanosis Skin: No rashes, no ulcers,no icterus     Data Reviewed: I have  personally reviewed following labs and imaging studies  CBC: Recent Labs  Lab 02/23/23 1128 02/24/23 0109 02/24/23 1014 02/25/23 0142 02/26/23 0151 02/28/23 0639  WBC 8.0 8.9  --  9.7 8.1 7.0  NEUTROABS 6.0  --   --   --   --   --   HGB 13.8 11.8*  --  11.0* 10.6* 11.7*  HCT 39.9 34.2*  --  32.9* 31.2* 33.5*  MCV 95.0 95.3  --  95.4 96.6 94.9  PLT 50* 44* 46* 45* 44* 51*   Basic Metabolic Panel: Recent Labs  Lab 02/23/23 1859 02/24/23 0109 02/25/23 0142 02/26/23 0151 02/28/23 0547  NA 135 134* 138 138 137  K 3.4* 4.1 3.7 3.8 3.2*  CL 103 102 104 104 101  CO2 20* 19* 20* 20* 21*  GLUCOSE 150* 165* 142* 132* 112*  BUN 15 14 14 17 16   CREATININE 1.16* 1.16* 1.06* 1.09* 1.04*  CALCIUM 9.5 9.6 9.6 9.4 8.9     Recent Results (from the past 240 hour(s))  Culture, blood (Routine x 2)     Status: None   Collection Time: 02/23/23 11:28 AM   Specimen:  BLOOD  Result Value Ref Range Status   Specimen Description BLOOD BLOOD RIGHT ARM  Final   Special Requests   Final    BOTTLES DRAWN AEROBIC AND ANAEROBIC Blood Culture adequate volume   Culture   Final    NO GROWTH 5 DAYS Performed at [facility], [address] [place], [place] [address]    Report Status 02/28/2023 FINAL  Final  Culture, blood (Routine x 2)     Status: None (Preliminary result)   Collection Time: 02/23/23 11:29 AM   Specimen: Left Antecubital; Blood  Result Value Ref Range Status   Specimen Description   Final    LEFT ANTECUBITAL BLOOD Performed at [facility], [address], [place], [place] [address]    Special Requests   Final    BOTTLES DRAWN AEROBIC AND ANAEROBIC Blood Culture adequate volume Performed at [facility], [address], [place], [place] [address]    Culture  Setup Time   Final    AEROBIC BOTTLE ONLY GRAM POSITIVE COCCI Gram Stain Report Called to,Read Back By and Verified With: WEST,MIKE ([place]) @ 1105 ON 02/27/2023 BY FRATTO,ASHLEY CRITICAL RESULT CALLED TO, READ  BACK BY AND VERIFIED WITH: PHARMD L. CHEN [id number] @ 1840 FH    Culture   Final    GRAM POSITIVE COCCI CULTURE REINCUBATED FOR BETTER GROWTH Performed at [facility], [address], [place], [place] [address]    Report Status PENDING  Incomplete  Blood Culture ID Panel (Reflexed)     Status: None   Collection Time: 02/23/23 11:29 AM  Result Value Ref Range Status   Enterococcus faecalis NOT DETECTED NOT DETECTED Final   Enterococcus Faecium NOT DETECTED NOT DETECTED Final   Listeria monocytogenes NOT DETECTED NOT DETECTED Final   Staphylococcus species NOT DETECTED NOT DETECTED Final   Staphylococcus aureus (BCID) NOT DETECTED NOT DETECTED Final   Staphylococcus epidermidis NOT DETECTED NOT DETECTED Final   Staphylococcus lugdunensis NOT DETECTED NOT DETECTED Final   Streptococcus species NOT DETECTED NOT DETECTED Final   Streptococcus agalactiae NOT DETECTED NOT DETECTED Final   Streptococcus pneumoniae NOT DETECTED NOT DETECTED Final   Streptococcus pyogenes NOT DETECTED NOT DETECTED Final   A.calcoaceticus-baumannii NOT DETECTED NOT DETECTED Final   Bacteroides fragilis NOT DETECTED NOT DETECTED Final   Enterobacterales NOT DETECTED NOT DETECTED Final   Enterobacter cloacae complex NOT DETECTED NOT DETECTED Final   Escherichia coli NOT DETECTED NOT DETECTED Final   Klebsiella aerogenes NOT DETECTED NOT DETECTED Final   Klebsiella oxytoca NOT DETECTED NOT DETECTED Final   Klebsiella pneumoniae NOT DETECTED NOT DETECTED Final   Proteus species NOT DETECTED NOT DETECTED Final   Salmonella species NOT DETECTED NOT DETECTED Final   Serratia marcescens NOT DETECTED NOT DETECTED Final   Haemophilus influenzae NOT DETECTED NOT DETECTED Final   Neisseria meningitidis NOT DETECTED NOT DETECTED Final   Pseudomonas aeruginosa NOT DETECTED NOT DETECTED Final   Stenotrophomonas maltophilia NOT DETECTED NOT DETECTED Final   Candida albicans NOT DETECTED NOT DETECTED Final   Candida  auris NOT DETECTED NOT DETECTED Final   Candida glabrata NOT DETECTED NOT DETECTED Final   Candida krusei NOT DETECTED NOT DETECTED Final   Candida parapsilosis NOT DETECTED NOT DETECTED Final   Candida tropicalis NOT DETECTED NOT DETECTED Final   Cryptococcus neoformans/gattii NOT DETECTED NOT DETECTED Final    Comment: Performed at [facility], [address] [place], [place] [address]  Culture, blood (Routine X 2)  w Reflex to ID Panel     Status: None (Preliminary result)   Collection Time: 02/27/23 12:52 PM   Specimen: BLOOD RIGHT HAND  Result Value Ref Range Status   Specimen Description BLOOD RIGHT HAND  Final   Special Requests   Final    BOTTLES DRAWN AEROBIC AND ANAEROBIC Blood Culture adequate volume   Culture   Final    NO GROWTH 2 DAYS Performed at [facility], [address] [place], [place] [address]    Report Status PENDING  Incomplete  Culture, blood (Routine X 2) w Reflex to ID Panel     Status: None (Preliminary result)   Collection Time: 02/27/23 12:53 PM   Specimen: BLOOD  Result Value Ref Range Status   Specimen Description BLOOD SITE NOT SPECIFIED  Final   Special Requests   Final    BOTTLES DRAWN AEROBIC AND ANAEROBIC Blood Culture adequate volume   Culture   Final    NO GROWTH 2 DAYS Performed at [facility], [address] [place], [place] [address]    Report Status PENDING  Incomplete     Radiology Studies: IR US CHEST  Result Date: 02/28/2023 CLINICAL DATA:  Evaluate for thoracentesis. EXAM: CHEST ULTRASOUND COMPARISON:  Chest radiograph 02/28/2023 FINDINGS: Small amount of right pleural fluid was identified. No percutaneous window for thoracentesis due to a flap of lung. Thoracentesis not performed. IMPRESSION: Small right pleural effusion. Thoracentesis not performed due to lack of a safe percutaneous window. Electronically Signed   By: Richarda Overlie M.D.   On: 02/28/2023 16:10   NM Bone Scan Whole Body  Result Date:  02/28/2023 CLINICAL DATA:  Lung cancer EXAM: NUCLEAR MEDICINE WHOLE BODY BONE SCAN TECHNIQUE: Whole body anterior and posterior images were obtained approximately 3 hours after intravenous injection of radiopharmaceutical. RADIOPHARMACEUTICALS:  21.0 mCi Technetium-80m MDP IV COMPARISON:  CT scan earlier June 2024 of multiple areas. FINDINGS: Physiologic distribution of radiotracer overall. There is some uptake involving the right midfoot consistent with known provided history of injury in the past. There is slight asymmetric uptake along the proximal left tibia from the other side. There is also some subtle areas of low uptake along the distal femoral metaphysis. Recommend dedicated x-rays. Mild areas of degenerative uptake such as along the extremities. Of note the kidneys are not as well seen as typical. Please correlate with clinical findings. IMPRESSION: Subtle asymmetric uptake involving the proximal left tibia. Slight low uptake along the distal femurs. Recommend dedicated x-rays if there is no known history. Atypical low uptake of the renal parenchyma. The rest of the findings do not suggest super scan but please correlate with the patient's renal function and other history. This may be technical. Electronically Signed   By: Karen Kays M.D.   On: 02/28/2023 14:40   DG CHEST PORT 1 VIEW  Result Date: 02/28/2023 CLINICAL DATA:  Pleural effusion on right. EXAM: PORTABLE CHEST 1 VIEW COMPARISON:  Radiographs 02/27/2023 and 02/23/2023. Abdominal CT 02/27/2023 and chest CT 02/23/2023. FINDINGS: 0845 hours. Mild patient rotation to the right. Grossly stable heart size and mediastinal contours with known extensive mediastinal lymphadenopathy. There are enlarging right greater than left pleural effusions with increasing atelectasis at both lung bases. No pneumothorax. The bones appear unchanged. Telemetry leads overlie the chest. IMPRESSION: 1. Enlarging right greater than left pleural effusions with increasing  bibasilar atelectasis. 2. Grossly stable mediastinal lymphadenopathy from recent chest CT, presumed lung cancer. Electronically Signed   By: Chrissie Noa  Purcell Mouton M.D.   On: 02/28/2023 12:55   MR BRAIN W WO CONTRAST  Result Date: 02/28/2023 CLINICAL DATA:  Staging of small cell lung carcinoma EXAM: MRI HEAD WITHOUT AND WITH CONTRAST TECHNIQUE: Multiplanar, multiecho pulse sequences of the brain and surrounding structures were obtained without and with intravenous contrast. CONTRAST:  7mL GADAVIST GADOBUTROL 1 MMOL/ML IV SOLN COMPARISON:  None Available. FINDINGS: Brain: No acute infarct, mass effect or extra-axial collection. No acute or chronic hemorrhage. Normal white matter signal. On diffusion-weighted imaging and the FLAIR sequence, there are bilateral convexity signal abnormalities within the subdural space. The midline structures are normal. There is smooth, diffuse pachymeningeal contrast enhancement. There are no intraparenchymal contrast enhancing lesions. Vascular: Major flow voids are preserved. Skull and upper cervical spine: Normal calvarium and skull base. Visualized upper cervical spine and soft tissues are normal. Sinuses/Orbits:Mastoid effusions. Paranasal sinuses are clear. Normal orbits. IMPRESSION: 1. No intraparenchymal metastatic disease. 2. Bilateral convexity signal abnormalities within the subdural space, which may indicate aging subdural blood. 3. Smooth, diffuse pachymeningeal contrast enhancement. 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 Metastatic disease felt less likely given the lack nodularity. Electronically Signed   By: Deatra Robinson M.D.   On: 02/28/2023 02:17   DG CHEST PORT 1 VIEW  Result Date: 02/27/2023 CLINICAL DATA:  Shortness of breath. EXAM: PORTABLE CHEST 1 VIEW COMPARISON:  February 23, 2023. FINDINGS: Stable cardiomegaly. Mild central pulmonary vascular  congestion is noted. Minimal bibasilar pulmonary edema is noted with small right pleural effusion. Bony thorax is unremarkable. IMPRESSION: Stable cardiomegaly with mild central pulmonary vascular congestion. Minimal bibasilar pulmonary edema is noted with probable small right pleural effusion. Electronically Signed   By: Lupita Raider M.D.   On: 02/27/2023 16:27    Scheduled Meds:  allopurinol  300 mg Oral BID   amiodarone  200 mg Oral Daily   budesonide (PULMICORT) nebulizer solution  0.5 mg Nebulization BID   dicyclomine  20 mg Oral Daily   fluticasone furoate-vilanterol  1 puff Inhalation Daily   metoprolol tartrate  25 mg Oral BID   nicotine  21 mg Transdermal Daily   polyethylene glycol  17 g Oral Daily   predniSONE  40 mg Oral Q breakfast   senna-docusate  1 tablet Oral BID   Continuous Infusions:     LOS: 6 days   Burnadette Pop, MD [facility] P6/20/2024, 11:25 AM

## 2023-03-02 ENCOUNTER — Ambulatory Visit
Admit: 2023-03-02 | Discharge: 2023-03-02 | Disposition: A | Discharge status: Home / Self Care | Payer: Medicaid Other | Attending: Radiation Oncology | Admitting: Radiation Oncology

## 2023-03-02 ENCOUNTER — Other Ambulatory Visit: Payer: Self-pay

## 2023-03-02 ENCOUNTER — Inpatient Hospital Stay (HOSPITAL_COMMUNITY): Payer: Medicaid Other

## 2023-03-02 DIAGNOSIS — R918 Other nonspecific abnormal finding of lung field: Secondary | ICD-10-CM | POA: Diagnosis not present

## 2023-03-02 HISTORY — PX: IR IMAGING GUIDED PORT INSERTION: IMG5740

## 2023-03-02 LAB — COMPREHENSIVE METABOLIC PANEL
ALT: 15 U/L (ref 0–44)
AST: 30 U/L (ref 15–41)
Albumin: 2.9 g/dL — ABNORMAL LOW (ref 3.5–5.0)
Alkaline Phosphatase: 106 U/L (ref 38–126)
Anion gap: 11 (ref 5–15)
BUN: 17 mg/dL (ref 6–20)
CO2: 23 mmol/L (ref 22–32)
Calcium: 9.5 mg/dL (ref 8.9–10.3)
Chloride: 100 mmol/L (ref 98–111)
Creatinine, Ser: 0.96 mg/dL (ref 0.44–1.00)
GFR, Estimated: >60 mL/min (ref >60)
Glucose, Bld: 91 mg/dL (ref 70–99)
Potassium: 3.5 mmol/L (ref 3.5–5.1)
Sodium: 134 mmol/L — ABNORMAL LOW (ref 135–145)
Total Bilirubin: 0.6 mg/dL (ref 0.3–1.2)
Total Protein: 5.9 g/dL — ABNORMAL LOW (ref 6.5–8.1)

## 2023-03-02 LAB — RAD ONC ARIA SESSION SUMMARY
Course Elapsed Days: 0
Plan Fractions Treated to Date: 1
Plan Prescribed Dose Per Fraction: 3 Gy
Plan Total Fractions Prescribed: 1
Plan Total Prescribed Dose: 3 Gy
Reference Point Dosage Given to Date: 3 Gy
Reference Point Session Dosage Given: 3 Gy
Session Number: 1

## 2023-03-02 LAB — CBC
HCT: 34.9 % — ABNORMAL LOW (ref 36.0–46.0)
Hemoglobin: 11.6 g/dL — ABNORMAL LOW (ref 12.0–15.0)
MCH: 31.4 pg (ref 26.0–34.0)
MCHC: 33.2 g/dL (ref 30.0–36.0)
MCV: 94.6 fL (ref 80.0–100.0)
Platelets: 48 10*3/uL — ABNORMAL LOW (ref 150–400)
RBC: 3.69 MIL/uL — ABNORMAL LOW (ref 3.87–5.11)
RDW: 14.3 % (ref 11.5–15.5)
WBC: 7.5 10*3/uL (ref 4.0–10.5)
nRBC: 2.7 % — ABNORMAL HIGH (ref 0.0–0.2)

## 2023-03-02 LAB — CULTURE, BLOOD (ROUTINE X 2)
Culture: NO GROWTH
Culture: NO GROWTH
Special Requests: ADEQUATE

## 2023-03-02 MED ORDER — SODIUM CHLORIDE 0.9 % IV SOLN
81.0000 mg/m2 | Freq: Once | INTRAVENOUS | Status: AC
  Administered 2023-03-03: 150 mg via INTRAVENOUS
  Filled 2023-03-02 (×2): qty 7.5

## 2023-03-02 MED ORDER — SODIUM CHLORIDE 0.9 % IV SOLN: INTRAVENOUS | Status: AC

## 2023-03-02 MED ORDER — SODIUM CHLORIDE 0.9 % IV SOLN
81.0000 mg/m2 | Freq: Once | INTRAVENOUS | Status: AC
  Administered 2023-03-04: 150 mg via INTRAVENOUS
  Filled 2023-03-02: qty 7.5

## 2023-03-02 MED ORDER — DIPHENHYDRAMINE HCL 50 MG/ML IJ SOLN
INTRAMUSCULAR | Status: AC | PRN
  Administered 2023-03-02: 25 mg via INTRAVENOUS

## 2023-03-02 MED ORDER — SODIUM CHLORIDE 0.9 % IV SOLN: Freq: Once | INTRAVENOUS | Status: AC

## 2023-03-02 MED ORDER — HEPARIN SOD (PORK) LOCK FLUSH 100 UNIT/ML IV SOLN
INTRAVENOUS | Status: AC
  Filled 2023-03-02: qty 5

## 2023-03-02 MED ORDER — SODIUM CHLORIDE 0.9 % IV SOLN
408.4000 mg | Freq: Once | INTRAVENOUS | Status: AC
  Administered 2023-03-02: 410 mg via INTRAVENOUS
  Filled 2023-03-02: qty 41

## 2023-03-02 MED ORDER — FENTANYL CITRATE (PF) 100 MCG/2ML IJ SOLN
INTRAMUSCULAR | Status: AC
  Filled 2023-03-02: qty 2

## 2023-03-02 MED ORDER — SODIUM CHLORIDE 0.9 % IV SOLN
81.0000 mg/m2 | Freq: Once | INTRAVENOUS | Status: AC
  Administered 2023-03-02: 150 mg via INTRAVENOUS
  Filled 2023-03-02: qty 7.5

## 2023-03-02 MED ORDER — FENTANYL CITRATE (PF) 100 MCG/2ML IJ SOLN
INTRAMUSCULAR | Status: AC | PRN
  Administered 2023-03-02: 50 ug via INTRAVENOUS

## 2023-03-02 MED ORDER — FENTANYL CITRATE (PF) 100 MCG/2ML IJ SOLN
INTRAMUSCULAR | Status: AC | PRN
  Administered 2023-03-02: 25 ug via INTRAVENOUS

## 2023-03-02 MED ORDER — HYDROCODONE-ACETAMINOPHEN 5-325 MG PO TABS
1.0000 | ORAL_TABLET | ORAL | Status: DC | PRN
  Administered 2023-03-02 (×2): 1 via ORAL
  Filled 2023-03-02 (×2): qty 1

## 2023-03-02 MED ORDER — HEPARIN SOD (PORK) LOCK FLUSH 100 UNIT/ML IV SOLN
500.0000 [IU] | Freq: Once | INTRAVENOUS | Status: AC
  Administered 2023-03-02: 500 [IU] via INTRAVENOUS
  Filled 2023-03-02: qty 5

## 2023-03-02 MED ORDER — PALONOSETRON HCL INJECTION 0.25 MG/5ML
0.2500 mg | Freq: Once | INTRAVENOUS | Status: AC
  Administered 2023-03-02: 0.25 mg via INTRAVENOUS
  Filled 2023-03-02: qty 5

## 2023-03-02 MED ORDER — LIDOCAINE HCL (PF) 1 % IJ SOLN
10.0000 mL | Freq: Once | INTRAMUSCULAR | Status: AC
  Administered 2023-03-02: 10 mL
  Filled 2023-03-02: qty 10

## 2023-03-02 MED ORDER — SODIUM CHLORIDE 0.9 % IV SOLN
10.0000 mg | Freq: Once | INTRAVENOUS | Status: AC
  Administered 2023-03-02: 10 mg via INTRAVENOUS
  Filled 2023-03-02: qty 1

## 2023-03-02 MED ORDER — SODIUM CHLORIDE 0.9 % IV SOLN
150.0000 mg | Freq: Once | INTRAVENOUS | Status: AC
  Administered 2023-03-02: 150 mg via INTRAVENOUS
  Filled 2023-03-02: qty 5

## 2023-03-02 MED ORDER — SODIUM CHLORIDE 0.9 % IV SOLN
10.0000 mg | Freq: Once | INTRAVENOUS | Status: AC
  Administered 2023-03-04: 10 mg via INTRAVENOUS
  Filled 2023-03-02: qty 1

## 2023-03-02 MED ORDER — SODIUM CHLORIDE 0.9 % IV SOLN
10.0000 mg | Freq: Once | INTRAVENOUS | Status: AC
  Administered 2023-03-03: 10 mg via INTRAVENOUS
  Filled 2023-03-02: qty 1

## 2023-03-02 MED ORDER — MIDAZOLAM HCL 2 MG/2ML IJ SOLN
INTRAMUSCULAR | Status: AC | PRN
  Administered 2023-03-02 (×2): .5 mg via INTRAVENOUS

## 2023-03-02 MED ORDER — LIDOCAINE-EPINEPHRINE 1 %-1:100000 IJ SOLN
INTRAMUSCULAR | Status: AC
  Filled 2023-03-02: qty 1

## 2023-03-02 MED ORDER — LIDOCAINE HCL 1 % IJ SOLN
20.0000 mL | Freq: Once | INTRAMUSCULAR | Status: AC
  Administered 2023-03-02: 17 mL via INTRADERMAL
  Filled 2023-03-02: qty 20

## 2023-03-02 MED ORDER — MIDAZOLAM HCL 2 MG/2ML IJ SOLN
INTRAMUSCULAR | Status: AC
  Filled 2023-03-02: qty 2

## 2023-03-02 MED ORDER — DIPHENHYDRAMINE HCL 50 MG/ML IJ SOLN
INTRAMUSCULAR | Status: AC
  Filled 2023-03-02: qty 1

## 2023-03-02 NOTE — Progress Notes (Signed)
Chemo education and consent obtained by Elease Etienne, RN and verified on the chart.   Blood return was verified from the port prior to beginning infusion.   The patient's name, date of birth, dose, rate, route, expiration date/time, appearance and physical integrity was verified by a second Charity fundraiser, Vera.  Chemotherapy, carboplatin, was administered over 30 minutes.  Patient tolerated infusion without complications. Blood return verified and port flushed with normal saline.

## 2023-03-02 NOTE — Progress Notes (Signed)
Ana Curry   DOB:August 04, 1971   NF#:[id number]   QIO#:[id number]  Oncology follow up   Subjective: Patient was transferred to [facility] after bone marrow biopsy and port placement this morning. She feels fatigued.  I saw her in rad/onc after her first dose radiation.    Objective:  Vitals:   03/02/23 1500 03/02/23 1810  BP: (!) 98/56 95/60  Pulse:  84  Resp:  16  Temp:  98.1 F (36.7 C)  SpO2:  94%    Body mass index is 23.87 kg/m.  Intake/Output Summary (Last 24 hours) at 03/02/2023 1835 Last data filed at 03/01/2023 2008 Gross per 24 hour  Intake 240 ml  Output --  Net 240 ml      Sclerae unicteric  Oropharynx clear  Large right low neck mass   MSK no focal spinal tenderness, no peripheral edema  Neuro nonfocal  CBG (last 3)  No results for input(s): "GLUCAP" in the last 72 hours.   Labs:  Urine Studies No results for input(s): "UHGB", "CRYS" in the last 72 hours.  Invalid input(s): "UACOL", "UAPR", "USPG", "UPH", "UTP", "UGL", "UKET", "UBIL", "UNIT", "UROB", "ULEU", "UEPI", "UWBC", "URBC", "UBAC", "CAST", "UCOM", "BILUA"  Basic Metabolic Panel: Recent Labs  Lab 02/25/23 0142 02/26/23 0151 02/28/23 0547 03/01/23 1050 03/02/23 1118  NA 138 138 137 134* 134*  K 3.7 3.8 3.2* 3.4* 3.5  CL 104 104 101 100 100  CO2 20* 20* 21* 23 23  GLUCOSE 142* 132* 112* 107* 91  BUN 14 17 16 19 17   CREATININE 1.06* 1.09* 1.04* 0.96 0.96  CALCIUM 9.6 9.4 8.9 9.3 9.5   GFR Estimated Creatinine Clearance: 69.2 mL/min (by C-G formula based on SCr of 0.96 mg/dL). Liver Function Tests: Recent Labs  Lab 02/24/23 0109 02/25/23 0142 03/02/23 1118  AST 52* 44* 30  ALT 27 28 15   ALKPHOS 177* 161* 106  BILITOT 0.6 0.7 0.6  PROT 6.3* 6.2* 5.9*  ALBUMIN 3.5 3.4* 2.9*   No results for input(s): "LIPASE", "AMYLASE" in the last 168 hours. No results for input(s): "AMMONIA" in the last 168 hours. Coagulation profile Recent Labs  Lab 02/24/23 1014  INR 1.1    CBC: Recent  Labs  Lab 02/25/23 0142 02/26/23 0151 02/28/23 0639 03/01/23 1050 03/02/23 0930  WBC 9.7 8.1 7.0 5.4 7.5  HGB 11.0* 10.6* 11.7* 11.6* 11.6*  HCT 32.9* 31.2* 33.5* 34.8* 34.9*  MCV 95.4 96.6 94.9 94.8 94.6  PLT 45* 44* 51* 47* 48*   Cardiac Enzymes: No results for input(s): "CKTOTAL", "CKMB", "CKMBINDEX", "TROPONINI" in the last 168 hours. BNP: Invalid input(s): "POCBNP" CBG: No results for input(s): "GLUCAP" in the last 168 hours. D-Dimer No results for input(s): "DDIMER" in the last 72 hours. Hgb A1c No results for input(s): "HGBA1C" in the last 72 hours. Lipid Profile No results for input(s): "CHOL", "HDL", "LDLCALC", "TRIG", "CHOLHDL", "LDLDIRECT" in the last 72 hours. Thyroid function studies No results for input(s): "TSH", "T4TOTAL", "T3FREE", "THYROIDAB" in the last 72 hours.  Invalid input(s): "FREET3" Anemia work up No results for input(s): "VITAMINB12", "FOLATE", "FERRITIN", "TIBC", "IRON", "RETICCTPCT" in the last 72 hours. Microbiology Recent Results (from the past 240 hour(s))  Culture, blood (Routine x 2)     Status: None   Collection Time: 02/23/23 11:28 AM   Specimen: BLOOD  Result Value Ref Range Status   Specimen Description BLOOD BLOOD RIGHT ARM  Final   Special Requests   Final    BOTTLES DRAWN AEROBIC AND ANAEROBIC Blood Culture  adequate volume   Culture   Final    NO GROWTH 5 DAYS Performed at [facility], [address], [place], [place] [address]    Report Status 02/28/2023 FINAL  Final  Culture, blood (Routine x 2)     Status: None   Collection Time: 02/23/23 11:29 AM   Specimen: Left Antecubital; Blood  Result Value Ref Range Status   Specimen Description   Final    LEFT ANTECUBITAL BLOOD Performed at [facility], [address] [place], [place] [address]    Special Requests   Final    BOTTLES DRAWN AEROBIC AND ANAEROBIC Blood Culture adequate volume Performed at [facility], [address] [place], [place] [address]     Culture  Setup Time   Final    AEROBIC BOTTLE ONLY GRAM POSITIVE COCCI Gram Stain Report Called to,Read Back By and Verified With: WEST,MIKE ([place]) @ 1105 ON 02/27/2023 BY FRATTO,ASHLEY CRITICAL RESULT CALLED TO, READ BACK BY AND VERIFIED WITH: PHARMD L. CHEN [id number] @ 1840 FH    Culture   Final    GRAM POSITIVE COCCI DERMACOCCUS NISHINOMIYAENSIS Standardized susceptibility testing for this organism is not available. Performed at [facility], [address] [place], [place] [address]    Report Status 03/02/2023 FINAL  Final  Blood Culture ID Panel (Reflexed)     Status: None   Collection Time: 02/23/23 11:29 AM  Result Value Ref Range Status   Enterococcus faecalis NOT DETECTED NOT DETECTED Final   Enterococcus Faecium NOT DETECTED NOT DETECTED Final   Listeria monocytogenes NOT DETECTED NOT DETECTED Final   Staphylococcus species NOT DETECTED NOT DETECTED Final   Staphylococcus aureus (BCID) NOT DETECTED NOT DETECTED Final   Staphylococcus epidermidis NOT DETECTED NOT DETECTED Final   Staphylococcus lugdunensis NOT DETECTED NOT DETECTED Final   Streptococcus species NOT DETECTED NOT DETECTED Final   Streptococcus agalactiae NOT DETECTED NOT DETECTED Final   Streptococcus pneumoniae NOT DETECTED NOT DETECTED Final   Streptococcus pyogenes NOT DETECTED NOT DETECTED Final   A.calcoaceticus-baumannii NOT DETECTED NOT DETECTED Final   Bacteroides fragilis NOT DETECTED NOT DETECTED Final   Enterobacterales NOT DETECTED NOT DETECTED Final   Enterobacter cloacae complex NOT DETECTED NOT DETECTED Final   Escherichia coli NOT DETECTED NOT DETECTED Final   Klebsiella aerogenes NOT DETECTED NOT DETECTED Final   Klebsiella oxytoca NOT DETECTED NOT DETECTED Final   Klebsiella pneumoniae NOT DETECTED NOT DETECTED Final   Proteus species NOT DETECTED NOT DETECTED Final   Salmonella species NOT DETECTED NOT DETECTED Final   Serratia marcescens NOT DETECTED NOT DETECTED Final    Haemophilus influenzae NOT DETECTED NOT DETECTED Final   Neisseria meningitidis NOT DETECTED NOT DETECTED Final   Pseudomonas aeruginosa NOT DETECTED NOT DETECTED Final   Stenotrophomonas maltophilia NOT DETECTED NOT DETECTED Final   Candida albicans NOT DETECTED NOT DETECTED Final   Candida auris NOT DETECTED NOT DETECTED Final   Candida glabrata NOT DETECTED NOT DETECTED Final   Candida krusei NOT DETECTED NOT DETECTED Final   Candida parapsilosis NOT DETECTED NOT DETECTED Final   Candida tropicalis NOT DETECTED NOT DETECTED Final   Cryptococcus neoformans/gattii NOT DETECTED NOT DETECTED Final    Comment: Performed at [facility], [address] [place], [place] [address]  Culture, blood (Routine X 2) w Reflex to ID Panel     Status: None (Preliminary result)   Collection Time: 02/27/23 12:52 PM   Specimen: BLOOD RIGHT HAND  Result Value  Ref Range Status   Specimen Description BLOOD RIGHT HAND  Final   Special Requests   Final    BOTTLES DRAWN AEROBIC AND ANAEROBIC Blood Culture adequate volume   Culture   Final    NO GROWTH 3 DAYS Performed at [facility], [address], [place], [place] [address]    Report Status PENDING  Incomplete  Culture, blood (Routine X 2) w Reflex to ID Panel     Status: None (Preliminary result)   Collection Time: 02/27/23 12:53 PM   Specimen: BLOOD  Result Value Ref Range Status   Specimen Description BLOOD SITE NOT SPECIFIED  Final   Special Requests   Final    BOTTLES DRAWN AEROBIC AND ANAEROBIC Blood Culture adequate volume   Culture   Final    NO GROWTH 3 DAYS Performed at [facility], [address], [place], [place] [address]    Report Status PENDING  Incomplete      Studies:  IR IMAGING GUIDED PORT INSERTION  Result Date: 03/02/2023 INDICATION: History of metastatic lung cancer. In need of durable intravenous access for the initiation of chemotherapy. EXAM: IMPLANTED PORT A CATH PLACEMENT WITH ULTRASOUND AND  FLUOROSCOPIC GUIDANCE COMPARISON:  Neck CT-02/23/2023 MEDICATIONS: None ANESTHESIA/SEDATION: Moderate (conscious) sedation was employed during this procedure as administered by the Interventional Radiology RN. A total of Benadryl 25 mg and Fentanyl 25 mcg was administered intravenously. Moderate Sedation Time: 23 minutes. The patient's level of consciousness and vital signs were monitored continuously by radiology nursing throughout the procedure under my direct supervision. CONTRAST:  None FLUOROSCOPY TIME:  24 seconds (4 mGy) COMPLICATIONS: None immediate. PROCEDURE: The procedure, risks, benefits, and alternatives were explained to the patient. Questions regarding the procedure were encouraged and answered. The patient understands and consents to the procedure. Given presence of known right supraclavicular bulky lymphadenopathy, the decision was made to proceed with left internal jugular approach port a catheter placement. As such, the left neck and chest were prepped with chlorhexidine in a sterile fashion, and a sterile drape was applied covering the operative field. Maximum barrier sterile technique with sterile gowns and gloves were used for the procedure. A timeout was performed prior to the initiation of the procedure. Local anesthesia was provided with 1% lidocaine with epinephrine. After creating a small venotomy incision, a micropuncture kit was utilized to access the internal jugular vein. Real-time ultrasound guidance was utilized for vascular access including the acquisition of a permanent ultrasound image documenting patency of the accessed vessel. The microwire was utilized to measure appropriate catheter length. A subcutaneous port pocket was then created along the upper chest wall utilizing a combination of sharp and blunt dissection. The pocket was irrigated with sterile saline. A single lumen clear view power injectable port was chosen for placement. The 8 Fr catheter was tunneled from the port  pocket site to the venotomy incision. The port was placed in the pocket. The external catheter was trimmed to appropriate length. At the venotomy, an 8 Fr peel-away sheath was placed over a guidewire under fluoroscopic guidance. The catheter was then placed through the sheath and the sheath was removed. Final catheter positioning was confirmed and documented with a fluoroscopic spot radiograph. The port was accessed with a Huber needle, aspirated and flushed with heparinized saline. The venotomy site was closed with an interrupted 4-0 Vicryl suture. The port pocket incision was closed with interrupted 2-0 Vicryl suture. Dermabond and Steri-strips were applied to both incisions. Dressings were applied. The  patient tolerated the procedure well without immediate post procedural complication. FINDINGS: After catheter placement, the tip lies within the superior cavoatrial junction. The catheter aspirates and flushes normally and is ready for immediate use. IMPRESSION: Successful placement of a left internal jugular approach power injectable Port-A-Cath. The catheter is ready for immediate use. Electronically Signed   By: Simonne Come M.D.   On: 03/02/2023 15:05   CT BONE MARROW BIOPSY & ASPIRATION  Result Date: 03/02/2023 INDICATION: History of metastatic lung cancer, now with thrombocytopenia. Please perform CT-guided bone marrow biopsy for tissue diagnostic purposes. EXAM: CT-GUIDED BONE MARROW BIOPSY AND ASPIRATION MEDICATIONS: None ANESTHESIA/SEDATION: Moderate (conscious) sedation was employed during this procedure as administered by the Interventional Radiology RN. A total of Benadryl 25 mg, Versed 1 mg and Fentanyl 50 mcg was administered intravenously. Moderate Sedation Time: 10 minutes. The patient's level of consciousness and vital signs were monitored continuously by radiology nursing throughout the procedure under my direct supervision. COMPLICATIONS: None immediate. PROCEDURE: Informed consent was  obtained from the patient following an explanation of the procedure, risks, benefits and alternatives. The patient understands, agrees and consents for the procedure. All questions were addressed. A time out was performed prior to the initiation of the procedure. The patient was positioned prone and non-contrast localization CT was performed of the pelvis to demonstrate the iliac marrow spaces. The operative site was prepped and draped in the usual sterile fashion. Under sterile conditions and local anesthesia, a 22 gauge spinal needle was utilized for procedural planning. Next, an 11 gauge coaxial bone biopsy needle was advanced into the left iliac marrow space. Needle position was confirmed with CT imaging. Initially, a bone marrow aspiration was performed. Next, a bone marrow biopsy was obtained with the 11 gauge outer bone marrow device. The needle was removed and superficial hemostasis was obtained with manual compression. A dressing was applied. The patient tolerated the procedure well without immediate post procedural complication. IMPRESSION: Successful CT guided left iliac bone marrow aspiration and core biopsy. Electronically Signed   By: Simonne Come M.D.   On: 03/02/2023 15:03    Assessment: 52 y.o.  Right small cell lung cancer, stage IV with cervical node metastasis, indeterminate right pleural effusion  moderate thrombocytopenia, likely DIC vs bone marrow involvement by malignancy Tumor lysis syndrome, resolved now  Right pleural effusion     Plan:  -lab reviewed, ok to treat, will proceed with C1D1 carboplatin and etoposide today with dose reduction, her care was coordinated with inpt team, pharmacy and rad/onc -her BP has been chronically low, will give her NS before chemo to see if she that improves her BP -plan to continue chemo with etoposide tomorrow and Sunday  -Dr. Bertis Ruddy will see her over the weekend. -if she is clinically stable after chemo, OK to discharge from  oncology standpoint.  -her outpt appointments have been scheduled,    Malachy Mood, MD 03/02/2023

## 2023-03-02 NOTE — Progress Notes (Signed)
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 are pending She has started radiation today  She will start cycle 1 day 1 of chemo today I will order labs daily over the weekend to monitor for TLS  Pain from procedure She will continue pain medications as needed  Acquired pancytopenia Possible bone marrow involvement Proceed with chemo despite low platelet count  Low BP I recommend IVF daily for low BP as well as hydration to reduce risk of TLS  Goals of care Completion of chemo this weekend  Discharge planning If stable for DC over the weekend, she can be DC after chemo on Sunday Outpatient follow-up has been arranged Will follow tomorrow  All questions were answered. The patient knows to call the clinic with any problems, questions or concerns.   The total time spent in the appointment was 55 minutes encounter with patients including review of chart and various tests results, discussions about plan of care and coordination of care plan  Artis Delay, MD 03/02/2023 3:27 PM  Subjective:  Patient of Dr. Mosetta Putt, transferred from [facility] for urgent chemotherapy for newly diagnosed SCLC She has just completed BM biopsy and port placement Has pain from procedure Had started radiation She appears comfortable in the room Noted low BP Her chart is reviewed independently  Objective:  Vitals:   03/02/23 1434 03/02/23 1500  BP: (!) 83/48 (!) 98/56  Pulse: 93   Resp: 18   Temp: (!) 97.5 F (36.4 C)   SpO2: 92%      Intake/Output Summary (Last 24 hours) at 03/02/2023 1527 Last data filed at 03/01/2023 2008 Gross per 24 hour  Intake 240 ml  Output --  Net 240 ml    GENERAL:alert, no distress and comfortable  NEURO: alert & oriented x 3 with fluent speech, no focal motor/sensory deficits   Labs:  Recent Labs    02/24/23 0109 02/25/23 0142 02/26/23 0151 02/28/23 0547 03/01/23 1050 03/02/23 1118   NA 134* 138   < > 137 134* 134*  K 4.1 3.7   < > 3.2* 3.4* 3.5  CL 102 104   < > 101 100 100  CO2 19* 20*   < > 21* 23 23  GLUCOSE 165* 142*   < > 112* 107* 91  BUN 14 14   < > 16 19 17   CREATININE 1.16* 1.06*   < > 1.04* 0.96 0.96  CALCIUM 9.6 9.6   < > 8.9 9.3 9.5  GFRNONAA 57* >60   < > >60 >60 >60  PROT 6.3* 6.2*  --   --   --  5.9*  ALBUMIN 3.5 3.4*  --   --   --  2.9*  AST 52* 44*  --   --   --  30  ALT 27 28  --   --   --  15  ALKPHOS 177* 161*  --   --   --  106  BILITOT 0.6 0.7  --   --   --  0.6   < > = values in this interval not displayed.    Studies: I have reviewed her imaging IR IMAGING GUIDED PORT INSERTION  Result Date: 03/02/2023 INDICATION: History of metastatic lung cancer. In need of durable intravenous access for the initiation of chemotherapy. EXAM: IMPLANTED PORT A CATH PLACEMENT WITH ULTRASOUND AND FLUOROSCOPIC GUIDANCE COMPARISON:  Neck CT-02/23/2023 MEDICATIONS: None ANESTHESIA/SEDATION: Moderate (conscious) sedation  was employed during this procedure as administered by the Interventional Radiology RN. A total of Benadryl 25 mg and Fentanyl 25 mcg was administered intravenously. Moderate Sedation Time: 23 minutes. The patient's level of consciousness and vital signs were monitored continuously by radiology nursing throughout the procedure under my direct supervision. CONTRAST:  None FLUOROSCOPY TIME:  24 seconds (4 mGy) COMPLICATIONS: None immediate. PROCEDURE: The procedure, risks, benefits, and alternatives were explained to the patient. Questions regarding the procedure were encouraged and answered. The patient understands and consents to the procedure. Given presence of known right supraclavicular bulky lymphadenopathy, the decision was made to proceed with left internal jugular approach port a catheter placement. As such, the left neck and chest were prepped with chlorhexidine in a sterile fashion, and a sterile drape was applied covering the operative field.  Maximum barrier sterile technique with sterile gowns and gloves were used for the procedure. A timeout was performed prior to the initiation of the procedure. Local anesthesia was provided with 1% lidocaine with epinephrine. After creating a small venotomy incision, a micropuncture kit was utilized to access the internal jugular vein. Real-time ultrasound guidance was utilized for vascular access including the acquisition of a permanent ultrasound image documenting patency of the accessed vessel. The microwire was utilized to measure appropriate catheter length. A subcutaneous port pocket was then created along the upper chest wall utilizing a combination of sharp and blunt dissection. The pocket was irrigated with sterile saline. A single lumen clear view power injectable port was chosen for placement. The 8 Fr catheter was tunneled from the port pocket site to the venotomy incision. The port was placed in the pocket. The external catheter was trimmed to appropriate length. At the venotomy, an 8 Fr peel-away sheath was placed over a guidewire under fluoroscopic guidance. The catheter was then placed through the sheath and the sheath was removed. Final catheter positioning was confirmed and documented with a fluoroscopic spot radiograph. The port was accessed with a Huber needle, aspirated and flushed with heparinized saline. The venotomy site was closed with an interrupted 4-0 Vicryl suture. The port pocket incision was closed with interrupted 2-0 Vicryl suture. Dermabond and Steri-strips were applied to both incisions. Dressings were applied. The patient tolerated the procedure well without immediate post procedural complication. FINDINGS: After catheter placement, the tip lies within the superior cavoatrial junction. The catheter aspirates and flushes normally and is ready for immediate use. IMPRESSION: Successful placement of a left internal jugular approach power injectable Port-A-Cath. The catheter is ready  for immediate use. Electronically Signed   By: Simonne Come M.D.   On: 03/02/2023 15:05   CT BONE MARROW BIOPSY & ASPIRATION  Result Date: 03/02/2023 INDICATION: History of metastatic lung cancer, now with thrombocytopenia. Please perform CT-guided bone marrow biopsy for tissue diagnostic purposes. EXAM: CT-GUIDED BONE MARROW BIOPSY AND ASPIRATION MEDICATIONS: None ANESTHESIA/SEDATION: Moderate (conscious) sedation was employed during this procedure as administered by the Interventional Radiology RN. A total of Benadryl 25 mg, Versed 1 mg and Fentanyl 50 mcg was administered intravenously. Moderate Sedation Time: 10 minutes. The patient's level of consciousness and vital signs were monitored continuously by radiology nursing throughout the procedure under my direct supervision. COMPLICATIONS: None immediate. PROCEDURE: Informed consent was obtained from the patient following an explanation of the procedure, risks, benefits and alternatives. The patient understands, agrees and consents for the procedure. All questions were addressed. A time out was performed prior to the initiation of the procedure. The patient was positioned prone and non-contrast  localization CT was performed of the pelvis to demonstrate the iliac marrow spaces. The operative site was prepped and draped in the usual sterile fashion. Under sterile conditions and local anesthesia, a 22 gauge spinal needle was utilized for procedural planning. Next, an 11 gauge coaxial bone biopsy needle was advanced into the left iliac marrow space. Needle position was confirmed with CT imaging. Initially, a bone marrow aspiration was performed. Next, a bone marrow biopsy was obtained with the 11 gauge outer bone marrow device. The needle was removed and superficial hemostasis was obtained with manual compression. A dressing was applied. The patient tolerated the procedure well without immediate post procedural complication. IMPRESSION: Successful CT guided left  iliac bone marrow aspiration and core biopsy. Electronically Signed   By: Simonne Come M.D.   On: 03/02/2023 15:03   IR US CHEST  Result Date: 02/28/2023 CLINICAL DATA:  Evaluate for thoracentesis. EXAM: CHEST ULTRASOUND COMPARISON:  Chest radiograph 02/28/2023 FINDINGS: Small amount of right pleural fluid was identified. No percutaneous window for thoracentesis due to a flap of lung. Thoracentesis not performed. IMPRESSION: Small right pleural effusion. Thoracentesis not performed due to lack of a safe percutaneous window. Electronically Signed   By: Richarda Overlie M.D.   On: 02/28/2023 16:10   NM Bone Scan Whole Body  Result Date: 02/28/2023 CLINICAL DATA:  Lung cancer EXAM: NUCLEAR MEDICINE WHOLE BODY BONE SCAN TECHNIQUE: Whole body anterior and posterior images were obtained approximately 3 hours after intravenous injection of radiopharmaceutical. RADIOPHARMACEUTICALS:  21.0 mCi Technetium-65m MDP IV COMPARISON:  CT scan earlier June 2024 of multiple areas. FINDINGS: Physiologic distribution of radiotracer overall. There is some uptake involving the right midfoot consistent with known provided history of injury in the past. There is slight asymmetric uptake along the proximal left tibia from the other side. There is also some subtle areas of low uptake along the distal femoral metaphysis. Recommend dedicated x-rays. Mild areas of degenerative uptake such as along the extremities. Of note the kidneys are not as well seen as typical. Please correlate with clinical findings. IMPRESSION: Subtle asymmetric uptake involving the proximal left tibia. Slight low uptake along the distal femurs. Recommend dedicated x-rays if there is no known history. Atypical low uptake of the renal parenchyma. The rest of the findings do not suggest super scan but please correlate with the patient's renal function and other history. This may be technical. Electronically Signed   By: Karen Kays M.D.   On: 02/28/2023 14:40   DG  CHEST PORT 1 VIEW  Result Date: 02/28/2023 CLINICAL DATA:  Pleural effusion on right. EXAM: PORTABLE CHEST 1 VIEW COMPARISON:  Radiographs 02/27/2023 and 02/23/2023. Abdominal CT 02/27/2023 and chest CT 02/23/2023. FINDINGS: 0845 hours. Mild patient rotation to the right. Grossly stable heart size and mediastinal contours with known extensive mediastinal lymphadenopathy. There are enlarging right greater than left pleural effusions with increasing atelectasis at both lung bases. No pneumothorax. The bones appear unchanged. Telemetry leads overlie the chest. IMPRESSION: 1. Enlarging right greater than left pleural effusions with increasing bibasilar atelectasis. 2. Grossly stable mediastinal lymphadenopathy from recent chest CT, presumed lung cancer. Electronically Signed   By: Carey Bullocks M.D.   On: 02/28/2023 12:55   MR BRAIN W WO CONTRAST  Result Date: 02/28/2023 CLINICAL DATA:  Staging of small cell lung carcinoma EXAM: MRI HEAD WITHOUT AND WITH CONTRAST TECHNIQUE: Multiplanar, multiecho pulse sequences of the brain and surrounding structures were obtained without and with intravenous contrast. CONTRAST:  7mL GADAVIST GADOBUTROL 1  MMOL/ML IV SOLN COMPARISON:  None Available. FINDINGS: Brain: No acute infarct, mass effect or extra-axial collection. No acute or chronic hemorrhage. Normal white matter signal. On diffusion-weighted imaging and the FLAIR sequence, there are bilateral convexity signal abnormalities within the subdural space. The midline structures are normal. There is smooth, diffuse pachymeningeal contrast enhancement. There are no intraparenchymal contrast enhancing lesions. Vascular: Major flow voids are preserved. Skull and upper cervical spine: Normal calvarium and skull base. Visualized upper cervical spine and soft tissues are normal. Sinuses/Orbits:Mastoid effusions. Paranasal sinuses are clear. Normal orbits. IMPRESSION: 1. No intraparenchymal metastatic disease. 2. Bilateral  convexity signal abnormalities within the subdural space, which may indicate aging subdural blood. 3. Smooth, diffuse pachymeningeal contrast enhancement. 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 Metastatic disease felt less likely given the lack nodularity. Electronically Signed   By: Deatra Robinson M.D.   On: 02/28/2023 02:17   DG CHEST PORT 1 VIEW  Result Date: 02/27/2023 CLINICAL DATA:  Shortness of breath. EXAM: PORTABLE CHEST 1 VIEW COMPARISON:  February 23, 2023. FINDINGS: Stable cardiomegaly. Mild central pulmonary vascular congestion is noted. Minimal bibasilar pulmonary edema is noted with small right pleural effusion. Bony thorax is unremarkable. IMPRESSION: Stable cardiomegaly with mild central pulmonary vascular congestion. Minimal bibasilar pulmonary edema is noted with probable small right pleural effusion. Electronically Signed   By: Lupita Raider M.D.   On: 02/27/2023 16:27   CT ABDOMEN PELVIS W CONTRAST  Result Date: 02/27/2023 CLINICAL DATA:  Non-small-cell lung cancer. Staging. * Tracking Code: BO * EXAM: CT ABDOMEN AND PELVIS WITH CONTRAST TECHNIQUE: Multidetector CT imaging of the abdomen and pelvis was performed using the standard protocol following bolus administration of intravenous contrast. RADIATION DOSE REDUCTION: This exam was performed according to the departmental dose-optimization program which includes automated exposure control, adjustment of the mA and/or kV according to patient size and/or use of iterative reconstruction technique. CONTRAST:  75mL OMNIPAQUE IOHEXOL 350 MG/ML SOLN COMPARISON:  Chest CTA 02/23/2023 FINDINGS: Lower chest: Bibasilar collapse/consolidation with bilateral pleural effusions, right greater than left. Pleural effusions are progressive since the chest CT 4 days ago. Hepatobiliary: Small area of low attenuation in the anterior liver,  adjacent to the falciform ligament, is in a characteristic location for focal fatty deposition. 9 mm hypodensity in the left liver (15/3) is too small to characterize but statistically is likely benign. There is no evidence for gallstones, gallbladder wall thickening, or pericholecystic fluid. No intrahepatic or extrahepatic biliary dilation. Pancreas: No focal mass lesion. No dilatation of the main duct. No intraparenchymal cyst. No peripancreatic edema. Spleen: No splenomegaly. No suspicious focal mass lesion. Adrenals/Urinary Tract: No adrenal nodule or mass. Kidneys unremarkable. No evidence for hydroureter. The urinary bladder appears normal for the degree of distention. Stomach/Bowel: Stomach is distended with food and fluid. Duodenum is normally positioned as is the ligament of Treitz. No small bowel wall thickening. No small bowel dilatation. The terminal ileum is normal. The appendix is normal. No gross colonic mass. No colonic wall thickening. Vascular/Lymphatic: Moderate atherosclerotic calcification is noted in the wall of the aorta. There is no gastrohepatic or hepatoduodenal ligament lymphadenopathy. No retroperitoneal or mesenteric lymphadenopathy. No pelvic sidewall lymphadenopathy. Reproductive: Unremarkable. Other: No intraperitoneal free fluid. Musculoskeletal: No worrisome lytic or sclerotic osseous abnormality. IMPRESSION: 1. No definite evidence for metastatic disease in the abdomen or pelvis. 2. 9 mm hypodensity in the left liver is too  small to characterize but statistically is likely benign. Attention on follow-up recommended. 3. Bibasilar collapse/consolidation with bilateral pleural effusions, right greater than left. Pleural effusions are progressive since the chest CT 4 days ago. 4.  Aortic Atherosclerosis (ICD10-I70.0). Electronically Signed   By: Kennith Center M.D.   On: 02/27/2023 11:47   [place] CORE BIOPSY (SOFT TISSUE)  Result Date: 02/26/2023 INDICATION: Cervical and  supraclavicular adenopathy.  No diagnosis. EXAM: ULTRASOUND-GUIDED RIGHT SUPRACLAVICULAR NODAL MASS BIOPSY COMPARISON:  CT neck and chest, 09/24/2022. MEDICATIONS: None ANESTHESIA/SEDATION: Local anesthetic was administered. COMPLICATIONS: None immediate. TECHNIQUE: Informed written consent was obtained from the patient and/or patient's representative after a discussion of the risks, benefits and alternatives to treatment. Questions regarding the procedure were encouraged and answered. Initial ultrasound scanning demonstrated RIGHT supraclavicular nodal mass. An ultrasound image was saved for documentation purposes. The procedure was planned. A timeout was performed prior to the initiation of the procedure. The operative was prepped and draped in the usual sterile fashion, and a sterile drape was applied covering the operative field. A timeout was performed prior to the initiation of the procedure. Local anesthesia was provided with 1% lidocaine with epinephrine. Under direct ultrasound guidance, an 18 gauge core needle device was utilized to obtain to obtain 3 core needle biopsies of the RIGHT supraclavicular nodal mass. The samples were placed in saline and submitted to pathology. The needle was removed and superficial hemostasis was achieved with manual compression. Post procedure scan was negative for significant hematoma. A dressing was applied. The patient tolerated the procedure well without immediate postprocedural complication. IMPRESSION: Successful core biopsy of RIGHT supraclavicular nodal mass. Roanna Banning, MD [facility] Radiology Electronically Signed   By: Roanna Banning M.D.   On: 02/26/2023 10:15   ECHOCARDIOGRAM COMPLETE  Result Date: 02/24/2023    ECHOCARDIOGRAM REPORT   Patient Name:   Ana Curry Date of Exam: 02/24/2023 Medical Rec #:  [id number]         Height:       68.0 in Accession #:    [id number]        Weight:       157.0 lb Date of  Birth:  Jan 24, 1971        BSA:          1.844 m Patient Age:    51 years          BP:           111/73 mmHg Patient Gender: F                 HR:           88 bpm. Exam Location:  Inpatient Procedure: 2D Echo, Cardiac Doppler and Color Doppler Indications:    A-FIB  History:        Patient has no prior history of Echocardiogram examinations.                 Arrythmias:Atrial Fibrillation; Risk Factors:Current Smoker.  Sonographer:    Darlys Gales Referring Phys: [phone] JACOB J STINSON IMPRESSIONS  1. Left ventricular ejection fraction, by estimation, is 60 to 65%. The left ventricle has normal function. The left ventricle has no regional wall motion abnormalities. Left ventricular diastolic parameters are indeterminate.  2. Right ventricular systolic function is normal. The right ventricular size is normal. Tricuspid regurgitation signal is inadequate for assessing PA pressure.  3. The mitral valve is normal in structure. No evidence of mitral valve regurgitation.  4. The aortic valve is grossly normal. Aortic valve regurgitation is not visualized.  5. The inferior vena cava is dilated in size with <50% respiratory variability, suggesting right atrial pressure of 15 mmHg. FINDINGS  Left Ventricle: Left ventricular ejection fraction, by estimation, is 60 to 65%. The left ventricle has normal function. The left ventricle has no regional wall motion abnormalities. The left ventricular internal cavity size was normal in size. There is  no left ventricular hypertrophy. Left ventricular diastolic parameters are indeterminate. Right Ventricle: The right ventricular size is normal. Right ventricular systolic function is normal. Tricuspid regurgitation signal is inadequate for assessing PA pressure. Left Atrium: Left atrial size was normal in size. Right Atrium: Right atrial size was normal in size. Pericardium: There is no evidence of pericardial effusion. Mitral Valve: The mitral valve is normal in structure. No evidence of  mitral valve regurgitation. Tricuspid Valve: Tricuspid valve regurgitation is not demonstrated. Aortic Valve: The aortic valve is grossly normal. Aortic valve regurgitation is not visualized. Aortic valve mean gradient measures 5.0 mmHg. Aortic valve peak gradient measures 7.6 mmHg. Aortic valve area, by VTI measures 2.82 cm. Pulmonic Valve: Pulmonic valve regurgitation is not visualized. Aorta: The aortic root and ascending aorta are structurally normal, with no evidence of dilitation. Venous: The inferior vena cava is dilated in size with less than 50% respiratory variability, suggesting right atrial pressure of 15 mmHg. IAS/Shunts: The interatrial septum was not well visualized.  LEFT VENTRICLE PLAX 2D LVIDd:         5.10 cm   Diastology LVIDs:         3.00 cm   LV e' medial:    7.07 cm/s LV PW:         0.80 cm   LV E/e' medial:  12.3 LV IVS:        0.70 cm   LV e' lateral:   10.20 cm/s LVOT diam:     1.80 cm   LV E/e' lateral: 8.5 LV SV:         76 LV SV Index:   41 LVOT Area:     2.54 cm  RIGHT VENTRICLE             IVC RV S prime:     15.10 cm/s  IVC diam: 2.50 cm TAPSE (M-mode): 2.5 cm LEFT ATRIUM             Index        RIGHT ATRIUM           Index LA Vol (A2C):   21.7 ml 11.77 ml/m  RA Area:     14.00 cm LA Vol (A4C):   27.3 ml 14.81 ml/m  RA Volume:   34.60 ml  18.76 ml/m LA Biplane Vol: 24.7 ml 13.40 ml/m  AORTIC VALVE AV Area (Vmax):    2.42 cm AV Area (Vmean):   2.26 cm AV Area (VTI):     2.82 cm AV Vmax:           138.00 cm/s AV Vmean:          102.000 cm/s AV VTI:            0.269 m AV Peak Grad:      7.6 mmHg AV Mean Grad:      5.0 mmHg LVOT Vmax:         131.00 cm/s LVOT Vmean:        90.500 cm/s LVOT VTI:  0.298 m LVOT/AV VTI ratio: 1.11  AORTA Ao Root diam: 3.20 cm Ao Asc diam:  3.50 cm MITRAL VALVE MV Area (PHT): 5.23 cm     SHUNTS MV Decel Time: 145 msec     Systemic VTI:  0.30 m MV E velocity: 87.00 cm/s   Systemic Diam: 1.80 cm MV A velocity: 102.00 cm/s MV E/A ratio:  0.85  Photographer signed by Carolan Clines Signature Date/Time: 02/24/2023/3:44:27 PM    Final    CT Angio Chest PE W and/or Wo Contrast  Result Date: 02/23/2023 CLINICAL DATA:  Shortness of breath, right neck mass, chest mass, headache * Tracking Code: BO * EXAM: CT ANGIOGRAPHY CHEST WITH CONTRAST TECHNIQUE: Multidetector CT imaging of the chest was performed using the standard protocol during bolus administration of intravenous contrast. Multiplanar CT image reconstructions and MIPs were obtained to evaluate the vascular anatomy. RADIATION DOSE REDUCTION: This exam was performed according to the departmental dose-optimization program which includes automated exposure control, adjustment of the mA and/or kV according to patient size and/or use of iterative reconstruction technique. CONTRAST:  75mL OMNIPAQUE IOHEXOL 350 MG/ML SOLN COMPARISON:  None Available. FINDINGS: Cardiovascular: No filling defect is identified in the pulmonary arterial tree to suggest pulmonary embolus. Mild atheromatous vascular calcification of the aortic arch. The large mediastinal mass bulges into the SVC posteriorly for example on image 128 series 4, probably from extrinsic compression, definite invasion is not observed. Because of lack of complete opacification from pulmonary arterial timing, patency of the left brachiocephalic vein is uncertain. Moderate pericardial effusion. Mediastinum/Nodes: Large right infrahilar and hilar mass extending into the mediastinum and tracking all the way to the thoracic inlet. This extends around the right mainstem bronchus, right upper lobe bronchus, and bronchus intermedius as well as the right lower lobe and right middle lobe bronchi, and likewise surrounds and narrows the right pulmonary artery and its branches. In the subcarinal region, the conglomerate mass measures about 7.9 by 5.6 cm (image 144, series 4) and the mass flattens and narrows the SVC without overt occlusion. In the right  infrahilar region, the observed mass measures 4.7 by 3.9 cm on image 179 series 4. Presumed lymph node anterior to the SVC in the upper chest measures 2.6 cm in short axis on image 36 series 3. A right lower neck level IV mass/lymph node measures 3.4 cm in short axis on image 10 series 3. Lungs/Pleura: The mass in the right chest displaces the upper trachea to the left. There is substantial narrowing of the right upper lobe bronchus, right middle lobe bronchus, and severe narrowing of the right lower lobe bronchus due to the surrounding mass. Patchy regions of nodularity in the right lower lobe distal to the mass may represent postobstructive pneumonitis or satellite tumor nodules. Centrilobular emphysema. Scarring or atelectasis in the right middle lobe and right upper lobe anteriorly. Mild dependent atelectasis in both lower lobes. Trace right pleural effusion, image 50 series 3. Upper Abdomen: Small nonspecific hypodense lesion in the left hepatic lobe 0.9 cm in long axis on image 96 series 3. Musculoskeletal: Unremarkable Review of the MIP images confirms the above findings. IMPRESSION: 1. Large right infrahilar and hilar mass extending into the mediastinum and tracking all the way to the thoracic inlet. This extends around the right mainstem bronchus, right upper lobe bronchus, and bronchus intermedius as well as the right lower lobe and right middle lobe bronchi, and likewise surrounds and narrows the right pulmonary artery and its branches. The mass  flattens and narrows the SVC without overt occlusion, and is confluent with bulky right level IV neck adenopathy. Top differential diagnostic considerations include lung cancer (such as squamous cell or small cell) versus extensive thoracic and right lower neck lymphoma. 2. Patchy regions of nodularity in the right lower lobe distal to the mass may represent postobstructive pneumonitis or satellite tumor nodules. 3. Presumed lymph node anterior to the SVC in the  upper chest measures 2.6 cm in short axis. 4. A right lower neck level IV mass/lymph node measures 3.4 cm in short axis, compatible with malignancy. 5. Moderate pericardial effusion. 6. Trace right pleural effusion. 7. Centrilobular emphysema. 8. Small nonspecific hypodense lesion in the left hepatic lobe 0.9 cm in long axis. 9. No pulmonary embolus is identified. 10. Aortic atherosclerosis. Aortic Atherosclerosis (ICD10-I70.0) and Emphysema (ICD10-J43.9). Electronically Signed   By: Gaylyn Rong M.D.   On: 02/23/2023 13:39   CT Soft Tissue Neck W Contrast  Result Date: 02/23/2023 CLINICAL DATA:  Neck mass, nonpulsatile right sided neck mass EXAM: CT NECK WITH CONTRAST TECHNIQUE: Multidetector CT imaging of the neck was performed using the standard protocol following the bolus administration of intravenous contrast. RADIATION DOSE REDUCTION: This exam was performed according to the departmental dose-optimization program which includes automated exposure control, adjustment of the mA and/or kV according to patient size and/or use of iterative reconstruction technique. CONTRAST:  75mL OMNIPAQUE IOHEXOL 350 MG/ML SOLN COMPARISON:  None Available. FINDINGS: Pharynx and larynx: Normal. No mass or swelling. Salivary glands: No inflammation, mass, or stone. Thyroid: Normal. Lymph nodes: There is bulky lower cervical and mediastinal lymphadenopathy involving the right level 3 and level 4 lymph node stations, the right supraclavicular region, and the anterior and posterior mediastinum. Cervical lymphadenopathy significant mass effect on the right internal jugular vein (series 5, image 68). Vascular: Negative. Mass effect on the right internal jugular vein, as above Limited intracranial: Negative. Visualized orbits: Negative. Mastoids and visualized paranasal sinuses: Moderate left-sided mastoid effusion. There is pneumatization of the petrous apices with air-fluid levels. Paranasal sinuses are clear. Orbits are  unremarkable. Skeleton: No acute or aggressive process. Upper chest: Mild paraseptal emphysema. See same day CT chest for additional findings Other: None. IMPRESSION: 1. Bulky right lower cervical and mediastinal lymphadenopathy, concerning for metastatic disease or lymphoma. 2. See separate CT chest for additional findings. Emphysema (ICD10-J43.9). Electronically Signed   By: Lorenza Cambridge M.D.   On: 02/23/2023 13:17   CT Head Wo Contrast  Result Date: 02/23/2023 CLINICAL DATA:  Headache EXAM: CT HEAD WITHOUT CONTRAST TECHNIQUE: Contiguous axial images were obtained from the base of the skull through the vertex without intravenous contrast. RADIATION DOSE REDUCTION: This exam was performed according to the departmental dose-optimization program which includes automated exposure control, adjustment of the mA and/or kV according to patient size and/or use of iterative reconstruction technique. COMPARISON:  None Available. FINDINGS: Brain: There is no acute intracranial hemorrhage, extra-axial fluid collection, or acute infarct Parenchymal volume is normal. The ventricles are normal in size. Gray-white differentiation is preserved The pituitary and suprasellar region are normal. There is no mass lesion. There is no mass effect or midline shift. Vascular: No hyperdense vessel or unexpected calcification. Skull: Normal. Negative for fracture or focal lesion. Sinuses/Orbits: The imaged paranasal sinuses are clear. The globes and orbits are unremarkable. Other: There are small bilateral mastoid effusions. IMPRESSION: 1. No acute intracranial pathology. 2. Small bilateral mastoid effusions. Electronically Signed   By: Lesia Hausen M.D.   On: 02/23/2023 13:15  DG Chest Port 1 View  Result Date: 02/23/2023 CLINICAL DATA:  Sepsis EXAM: PORTABLE CHEST 1 VIEW COMPARISON:  X-ray 01/09/2023 FINDINGS: No pneumothorax, effusion or edema. Calcified aorta. Overlapping cardiac leads. There is new widening of the mediastinum  compared to prior x-ray and fullness of the right lung hilum. Recommend further evaluation with CT to delineate for potential mass. IMPRESSION: New widening of the mediastinum with masslike fullness in the right lung hilum. Recommend follow up contrast CT to further delineate Electronically Signed   By: Karen Kays M.D.   On: 02/23/2023 13:11

## 2023-03-02 NOTE — Progress Notes (Signed)
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  Patient was seen by her primary care physician couple of times and was given antibiotics and was also diagnosed with pneumonia but she had no wheezing, coughing.  Recently noted to have right neck mass.  On presentation,she was hypoxic and requiring supplemental oxygen.  CT chest showed right infrahilar/hilar mass that extends around the right mainstem bronchus, the right upper lobe and all the way to the thoracic inlet with bulky adenopathy.  EKG showed A-fib with RVR.  Given iv  Cardizem.  Lab work showed potassium 3.2, alkaline phosphatase of 203, lactate of 3,  Platelets of 50.  Cardiology, PCCM,oncology consulted.  S/P  IR guided cervical node biopsy on 6/17.  Biopsy confirmed small cell carcinoma.  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  Radiation oncology planning to start on radiation treatment after simulation today.  S/p Port-A-Cath placement and bone marrow biopsy today.  Plan is to transfer her to [facility] to start on radiation and possible chemotherapy  Assessment & Plan:  Principal Problem:   Lung mass Active Problems:   Atrial fibrillation with RVR (HCC)   Hypokalemia   Acute respiratory failure with hypoxia (HCC)   Thrombocytopenia (HCC)   COPD with acute exacerbation (HCC)   Lactic acidosis   Small cell carcinoma of lung, right (HCC)   Small cell carcinoma of right lung: Presented with shortness of breath.  Imaging showed  right infrahilar/hilar mass that extends around the right mainstem bronchus, the right upper lobe and all the way to the thoracic inlet with bulky adenopathy, likely lung cancer but could be lymphoma too.  Family history of lymphoma.   Case was also discussed with cardiothoracic surgery, not a candidate for surgery.  PCCM/oncology consulted.  Status post  cervical lymph node biopsy.   LDH, uric acid elevated with concern for tumor lysis syndrome. Treated with IV fluid, allopurinol CT abdomen/pelvis with for staging purpose did not show any clear metastatic disease. MRI of the brain did not show any metastatic disease.Biopsy confirmed small cell carcinoma.  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  Radiation oncology planning to start on radiation treatment today.  S/p Port-A-Cath placement and bone marrow biopsy .  Oncology considering starting chemotherapy here.  Acute respiratory failure with hypoxia: Likely secondary to right lung mass/concomitant COPD/pleural effusion.    CT abdomen/pelvis done showed bilateral lower lung collapse/consolidations, pleural effusion.  CXR showed bilateral pleural effusion more than right.  Thoracentesis attempted but not enough fluid to be removed.  She qualified for oxygen for 3 L at home.  COPD: No history of COPD in the past.  Presented with wheezing, shortness of breath.  Patient is a smoker.  Started on bronchodilators.  Currently on 2-3 of oxygen per minute.  She has mild wheezing on auscultation.  She will need home oxygen  Gram-positive bacteremia: 1 set of blood culture sent on [facility] has shown gram-positive cocci in aerobic bottle.  We are repeating blood cultures.  She is not septic.  Afebrile, no leukocytosis. Normal procalcitonin.    Recent echo has not shown any vegetation.  Repeat blood cultures have not shown any growth.  This is most likely  contamination ,continue to hold antibiotics  A-fib with RVR: New onset.  Initially started on Cardizem drip, amiodarone drip.  CHA2DS2-VASc score low,doesnot   not need anticoagulation.  Patient also has severe thrombocytopenia.  TSH normal.  2D echo showed normal EF, no  wall motion abnormality.. She converted to  normal sinus rhythm .  Amiodarone changed to oral.  Lactic acidosis: On gentle IV fluids, I do not think her elevated lactate level is from sepsis.Resolved  Thrombocytopenia: No bleeding.  Likely associated with malignancy/DIC. Oncology/hematology following   Hypokalemia: Monitor and being supplemented as needed  Tobacco use: Counseled cessation.  Has history of 50+ pack year of smoking.         DVT prophylaxis:SCDs Start: 02/23/23 2132     Code Status: Full Code  Family Communication:Sister at the bedside on 6/21  Patient status:Inpatient  Patient is from :home  Anticipated discharge MV:[facility]  Estimated DC date: after clearance from oncology   Consultants: PCCM,cardiology, oncology  Procedures: Cervical lymph node biopsy, bone marrow biopsy, Port-A-Cath placement  Antimicrobials:  Anti-infectives (From admission, onward)    None       Subjective: Patient seen and examined at bedside today.  She just came from bone marrow biopsy.  She overall looks comfortable.  He denies any worsening shortness of breath.  Still on 3 L of oxygen per minute.  Long discussion had with the patient and sister at the bedside about her management plan and plan for transfer to [facility]  Objective: Vitals:   03/02/23 0935 03/02/23 0940 03/02/23 0945 03/02/23 0950  BP: 97/63 (!) 93/59 (!) 83/61 (!) 87/64  Pulse: 95 97 98 96  Resp: (!) 25 19 15  (!) 25  Temp:      TempSrc:      SpO2: 95% 94% 94% 94%  Weight:      Height:        Intake/Output Summary (Last 24 hours) at 03/02/2023 1153 Last data filed at 03/01/2023 2008 Gross per 24 hour  Intake 240 ml  Output --  Net 240 ml   Filed Weights   02/23/23 1108  Weight: 71.2 kg    Examination:  General exam: Overall comfortable, not in distress HEENT: PERRL Respiratory system: Diminished sounds bilaterally, bilateral mild rhonchi/wheezing Cardiovascular system: S1 & S2  heard, RRR.  Port-A-Cath on the left chest Gastrointestinal system: Abdomen is nondistended, soft and nontender. Central nervous system: Alert and oriented Extremities: No edema, no clubbing ,no cyanosis Skin: No rashes, no ulcers,no icterus     Data Reviewed: I have personally reviewed following labs and imaging studies  CBC: Recent Labs  Lab 02/25/23 0142 02/26/23 0151 02/28/23 0639 03/01/23 1050 03/02/23 0930  WBC 9.7 8.1 7.0 5.4 7.5  HGB 11.0* 10.6* 11.7* 11.6* 11.6*  HCT 32.9* 31.2* 33.5* 34.8* 34.9*  MCV 95.4 96.6 94.9 94.8 94.6  PLT 45* 44* 51* 47* 48*   Basic Metabolic Panel: Recent Labs  Lab 02/24/23 0109 02/25/23 0142 02/26/23 0151 02/28/23 0547 03/01/23 1050  NA 134* 138 138 137 134*  K 4.1 3.7 3.8 3.2* 3.4*  CL 102 104 104 101 100  CO2 19* 20* 20* 21* 23  GLUCOSE 165* 142* 132* 112* 107*  BUN 14 14 17 16 19   CREATININE 1.16* 1.06* 1.09* 1.04* 0.96  CALCIUM 9.6 9.6 9.4 8.9 9.3     Recent Results (from the past 240 hour(s))  Culture, blood (Routine x 2)     Status: None   Collection Time: 02/23/23 11:28  AM   Specimen: BLOOD  Result Value Ref Range Status   Specimen Description BLOOD BLOOD RIGHT ARM  Final   Special Requests   Final    BOTTLES DRAWN AEROBIC AND ANAEROBIC Blood Culture adequate volume   Culture   Final    NO GROWTH 5 DAYS Performed at [facility], [address] [place], [place] [address]    Report Status 02/28/2023 FINAL  Final  Culture, blood (Routine x 2)     Status: None   Collection Time: 02/23/23 11:29 AM   Specimen: Left Antecubital; Blood  Result Value Ref Range Status   Specimen Description   Final    LEFT ANTECUBITAL BLOOD Performed at [facility], [address], [place], [place] [address]    Special Requests   Final    BOTTLES DRAWN AEROBIC AND ANAEROBIC Blood Culture adequate volume Performed at [facility], [address], [place], [place] [address]    Culture  Setup Time   Final    AEROBIC BOTTLE  ONLY GRAM POSITIVE COCCI Gram Stain Report Called to,Read Back By and Verified With: WEST,MIKE ([place]) @ 1105 ON 02/27/2023 BY FRATTO,ASHLEY CRITICAL RESULT CALLED TO, READ BACK BY AND VERIFIED WITH: PHARMD L. CHEN [id number] @ 1840 FH    Culture   Final    GRAM POSITIVE COCCI DERMACOCCUS NISHINOMIYAENSIS Standardized susceptibility testing for this organism is not available. Performed at [facility], [address], [place], [place] [address]    Report Status 03/02/2023 FINAL  Final  Blood Culture ID Panel (Reflexed)     Status: None   Collection Time: 02/23/23 11:29 AM  Result Value Ref Range Status   Enterococcus faecalis NOT DETECTED NOT DETECTED Final   Enterococcus Faecium NOT DETECTED NOT DETECTED Final   Listeria monocytogenes NOT DETECTED NOT DETECTED Final   Staphylococcus species NOT DETECTED NOT DETECTED Final   Staphylococcus aureus (BCID) NOT DETECTED NOT DETECTED Final   Staphylococcus epidermidis NOT DETECTED NOT DETECTED Final   Staphylococcus lugdunensis NOT DETECTED NOT DETECTED Final   Streptococcus species NOT DETECTED NOT DETECTED Final   Streptococcus agalactiae NOT DETECTED NOT DETECTED Final   Streptococcus pneumoniae NOT DETECTED NOT DETECTED Final   Streptococcus pyogenes NOT DETECTED NOT DETECTED Final   A.calcoaceticus-baumannii NOT DETECTED NOT DETECTED Final   Bacteroides fragilis NOT DETECTED NOT DETECTED Final   Enterobacterales NOT DETECTED NOT DETECTED Final   Enterobacter cloacae complex NOT DETECTED NOT DETECTED Final   Escherichia coli NOT DETECTED NOT DETECTED Final   Klebsiella aerogenes NOT DETECTED NOT DETECTED Final   Klebsiella oxytoca NOT DETECTED NOT DETECTED Final   Klebsiella pneumoniae NOT DETECTED NOT DETECTED Final   Proteus species NOT DETECTED NOT DETECTED Final   Salmonella species NOT DETECTED NOT DETECTED Final   Serratia marcescens NOT DETECTED NOT DETECTED Final   Haemophilus influenzae NOT DETECTED NOT DETECTED  Final   Neisseria meningitidis NOT DETECTED NOT DETECTED Final   Pseudomonas aeruginosa NOT DETECTED NOT DETECTED Final   Stenotrophomonas maltophilia NOT DETECTED NOT DETECTED Final   Candida albicans NOT DETECTED NOT DETECTED Final   Candida auris NOT DETECTED NOT DETECTED Final   Candida glabrata NOT DETECTED NOT DETECTED Final   Candida krusei NOT DETECTED NOT DETECTED Final   Candida parapsilosis NOT DETECTED NOT DETECTED Final   Candida tropicalis NOT DETECTED NOT DETECTED Final   Cryptococcus neoformans/gattii NOT DETECTED NOT DETECTED Final    Comment: Performed at [facility], [address]  [place], [place] [address]  Culture, blood (Routine X 2) w Reflex to ID Panel     Status: None (Preliminary result)   Collection Time: 02/27/23 12:52 PM   Specimen: BLOOD RIGHT HAND  Result Value Ref Range Status   Specimen Description BLOOD RIGHT HAND  Final   Special Requests   Final    BOTTLES DRAWN AEROBIC AND ANAEROBIC Blood Culture adequate volume   Culture   Final    NO GROWTH 3 DAYS Performed at [facility], [address] [place], [place] [address]    Report Status PENDING  Incomplete  Culture, blood (Routine X 2) w Reflex to ID Panel     Status: None (Preliminary result)   Collection Time: 02/27/23 12:53 PM   Specimen: BLOOD  Result Value Ref Range Status   Specimen Description BLOOD SITE NOT SPECIFIED  Final   Special Requests   Final    BOTTLES DRAWN AEROBIC AND ANAEROBIC Blood Culture adequate volume   Culture   Final    NO GROWTH 3 DAYS Performed at [facility], [address] [place], [place] [address]    Report Status PENDING  Incomplete     Radiology Studies: IR US CHEST  Result Date: 02/28/2023 CLINICAL DATA:  Evaluate for thoracentesis. EXAM: CHEST ULTRASOUND COMPARISON:  Chest radiograph 02/28/2023 FINDINGS: Small amount of right pleural fluid was identified. No percutaneous window for thoracentesis due to a flap of lung. Thoracentesis  not performed. IMPRESSION: Small right pleural effusion. Thoracentesis not performed due to lack of a safe percutaneous window. Electronically Signed   By: Richarda Overlie M.D.   On: 02/28/2023 16:10   NM Bone Scan Whole Body  Result Date: 02/28/2023 CLINICAL DATA:  Lung cancer EXAM: NUCLEAR MEDICINE WHOLE BODY BONE SCAN TECHNIQUE: Whole body anterior and posterior images were obtained approximately 3 hours after intravenous injection of radiopharmaceutical. RADIOPHARMACEUTICALS:  21.0 mCi Technetium-15m MDP IV COMPARISON:  CT scan earlier June 2024 of multiple areas. FINDINGS: Physiologic distribution of radiotracer overall. There is some uptake involving the right midfoot consistent with known provided history of injury in the past. There is slight asymmetric uptake along the proximal left tibia from the other side. There is also some subtle areas of low uptake along the distal femoral metaphysis. Recommend dedicated x-rays. Mild areas of degenerative uptake such as along the extremities. Of note the kidneys are not as well seen as typical. Please correlate with clinical findings. IMPRESSION: Subtle asymmetric uptake involving the proximal left tibia. Slight low uptake along the distal femurs. Recommend dedicated x-rays if there is no known history. Atypical low uptake of the renal parenchyma. The rest of the findings do not suggest super scan but please correlate with the patient's renal function and other history. This may be technical. Electronically Signed   By: Karen Kays M.D.   On: 02/28/2023 14:40    Scheduled Meds:  allopurinol  300 mg Oral BID   amiodarone  200 mg Oral Daily   budesonide (PULMICORT) nebulizer solution  0.5 mg Nebulization BID   dicyclomine  20 mg Oral Daily   fluticasone furoate-vilanterol  1 puff Inhalation Daily   metoprolol tartrate  25 mg Oral BID   nicotine  21 mg Transdermal Daily   polyethylene glycol  17 g Oral Daily   predniSONE  40 mg Oral Q breakfast    senna-docusate  1 tablet Oral BID   Continuous Infusions:     LOS: 7 days   Burnadette Pop, MD  [facility] P6/21/2024, 11:53 AM

## 2023-03-02 NOTE — Progress Notes (Signed)
Chemo education and consent obtained by Elease Etienne, RN and verified on the chart.    Blood return was verified from the port prior to beginning infusion.    The patient's name, date of birth, dose, rate, route, expiration date/time, appearance and physical integrity was verified by a second RN, Meg.   Chemotherapy, etoposide, was administered over 60 minutes.   Patient tolerated infusion without complications. Blood return verified and port flushed with normal saline.

## 2023-03-02 NOTE — Procedures (Signed)
Pre-procedure Diagnosis: Metastatic lung cancer, thrombocytopenia Post-procedure Diagnosis: Same  Technically successful CT guided bone marrow aspiration and biopsy of left iliac crest.   Complications: None Immediate  EBL: None  Signed: Simonne Come Pager: [phone] 03/02/2023, 9:15 AM

## 2023-03-03 DIAGNOSIS — R918 Other nonspecific abnormal finding of lung field: Secondary | ICD-10-CM | POA: Diagnosis not present

## 2023-03-03 LAB — CBC
HCT: 32.6 % — ABNORMAL LOW (ref 36.0–46.0)
Hemoglobin: 10.8 g/dL — ABNORMAL LOW (ref 12.0–15.0)
MCH: 32.1 pg (ref 26.0–34.0)
MCHC: 33.1 g/dL (ref 30.0–36.0)
MCV: 97 fL (ref 80.0–100.0)
Platelets: 50 10*3/uL — ABNORMAL LOW (ref 150–400)
RBC: 3.36 MIL/uL — ABNORMAL LOW (ref 3.87–5.11)
RDW: 14.3 % (ref 11.5–15.5)
WBC: 8.2 10*3/uL (ref 4.0–10.5)
nRBC: 0.7 % — ABNORMAL HIGH (ref 0.0–0.2)

## 2023-03-03 LAB — URIC ACID: Uric Acid, Serum: 2.2 mg/dL — ABNORMAL LOW (ref 2.5–7.1)

## 2023-03-03 LAB — BASIC METABOLIC PANEL
Anion gap: 7 (ref 5–15)
BUN: 20 mg/dL (ref 6–20)
CO2: 21 mmol/L — ABNORMAL LOW (ref 22–32)
Calcium: 9.1 mg/dL (ref 8.9–10.3)
Chloride: 104 mmol/L (ref 98–111)
Creatinine, Ser: 0.79 mg/dL (ref 0.44–1.00)
GFR, Estimated: >60 mL/min (ref >60)
Glucose, Bld: 156 mg/dL — ABNORMAL HIGH (ref 70–99)
Potassium: 4.3 mmol/L (ref 3.5–5.1)
Sodium: 132 mmol/L — ABNORMAL LOW (ref 135–145)

## 2023-03-03 LAB — CULTURE, BLOOD (ROUTINE X 2): Special Requests: ADEQUATE

## 2023-03-03 LAB — LACTATE DEHYDROGENASE: LDH: 507 U/L — ABNORMAL HIGH (ref 98–192)

## 2023-03-03 MED ORDER — POLYVINYL ALCOHOL 1.4 % OP SOLN: 1.0000 [drp] | OPHTHALMIC | Status: DC | PRN

## 2023-03-03 MED ORDER — HYDROCODONE-ACETAMINOPHEN 5-325 MG PO TABS
1.0000 | ORAL_TABLET | ORAL | Status: DC | PRN
  Administered 2023-03-03 – 2023-03-04 (×4): 1 via ORAL
  Administered 2023-03-05 (×2): 2 via ORAL
  Filled 2023-03-03: qty 1
  Filled 2023-03-03: qty 2
  Filled 2023-03-03 (×2): qty 1
  Filled 2023-03-03: qty 2
  Filled 2023-03-03: qty 1

## 2023-03-03 MED ORDER — ALUM & MAG HYDROXIDE-SIMETH 200-200-20 MG/5ML PO SUSP: 30.0000 mL | ORAL | Status: DC | PRN

## 2023-03-03 MED ORDER — LIP MEDEX EX OINT: 1.0000 | TOPICAL_OINTMENT | CUTANEOUS | Status: DC | PRN

## 2023-03-03 MED ORDER — SALINE SPRAY 0.65 % NA SOLN: 1.0000 | NASAL | Status: DC | PRN

## 2023-03-03 MED ORDER — METOPROLOL TARTRATE 5 MG/5ML IV SOLN: 5.0000 mg | INTRAVENOUS | Status: DC | PRN

## 2023-03-03 MED ORDER — SENNOSIDES-DOCUSATE SODIUM 8.6-50 MG PO TABS: 1.0000 | ORAL_TABLET | Freq: Every evening | ORAL | Status: DC | PRN

## 2023-03-03 MED ORDER — TRAZODONE HCL 50 MG PO TABS: 50.0000 mg | ORAL_TABLET | Freq: Every evening | ORAL | Status: DC | PRN

## 2023-03-03 MED ORDER — PHENOL 1.4 % MT LIQD: 1.0000 | OROMUCOSAL | Status: DC | PRN

## 2023-03-03 MED ORDER — HYDRALAZINE HCL 20 MG/ML IJ SOLN: 10.0000 mg | INTRAMUSCULAR | Status: DC | PRN

## 2023-03-03 MED ORDER — CHLORHEXIDINE GLUCONATE CLOTH 2 % EX PADS
6.0000 | MEDICATED_PAD | Freq: Every day | CUTANEOUS | Status: DC
  Administered 2023-03-04 – 2023-03-05 (×2): 6 via TOPICAL

## 2023-03-03 MED ORDER — LORATADINE 10 MG PO TABS: 10.0000 mg | ORAL_TABLET | Freq: Every day | ORAL | Status: DC | PRN

## 2023-03-03 MED ORDER — HYDROCORTISONE (PERIANAL) 2.5 % EX CREA: 1.0000 | TOPICAL_CREAM | Freq: Four times a day (QID) | CUTANEOUS | Status: DC | PRN

## 2023-03-03 MED ORDER — MUSCLE RUB 10-15 % EX CREA
1.0000 | TOPICAL_CREAM | Freq: Every day | CUTANEOUS | Status: DC | PRN
  Administered 2023-03-04: 1 via TOPICAL
  Filled 2023-03-03: qty 85

## 2023-03-03 MED ORDER — HYDROCORTISONE 1 % EX CREA: 1.0000 | TOPICAL_CREAM | Freq: Three times a day (TID) | CUTANEOUS | Status: DC | PRN

## 2023-03-03 MED ORDER — MORPHINE SULFATE (PF) 2 MG/ML IV SOLN
1.0000 mg | INTRAVENOUS | Status: DC | PRN
  Administered 2023-03-03 – 2023-03-04 (×2): 1 mg via INTRAVENOUS
  Filled 2023-03-03 (×2): qty 1

## 2023-03-03 NOTE — Progress Notes (Signed)
Ana Curry   DOB:06/19/1971   GY#:[id number]    ASSESSMENT & PLAN:  Extensive stage SCLC Bone marrow biopsy results are pending She has started radiation on 03/02/23 She will start cycle 1 day 1 of chemo on 03/02/23, tolerated chemotherapy well so far No signs of tumor lysis syndrome   Pain from procedure She will continue pain medications as needed  Respiratory failure due to lung cancer We will test her oxygen level to see if she needs oxygen prior to discharge   Acquired pancytopenia Possible bone marrow involvement Proceed with chemo despite low platelet count   Low BP This has improved with IV fluids I recommend IVF daily for low BP as well as hydration to reduce risk of TLS   Goals of care Completion of chemo this weekend   Discharge planning If stable for DC over the weekend, she can be DC after chemo on Sunday Outpatient follow-up has been arranged Will follow tomorrow  All questions were answered. The patient knows to call the clinic with any problems, questions or concerns.   The total time spent in the appointment was 40 minutes encounter with patients including review of chart and various tests results, discussions about plan of care and coordination of care plan  Artis Delay, MD 03/03/2023 11:03 AM  Subjective:  She felt better today.  Pain is better controlled.  No signs of tumor lysis syndrome so far Blood pressure has improved No side effects such as nausea from treatment  Objective:  Vitals:   03/03/23 0632 03/03/23 0801  BP: 111/69   Pulse: 90   Resp:    Temp:    SpO2:  93%     Intake/Output Summary (Last 24 hours) at 03/03/2023 1103 Last data filed at 03/03/2023 0358 Gross per 24 hour  Intake 2345.7 ml  Output --  Net 2345.7 ml    GENERAL:alert, no distress and comfortable NEURO: alert & oriented x 3 with fluent speech, no focal motor/sensory deficits   Labs:  Recent Labs    02/24/23 0109 02/25/23 0142 02/26/23 0151  03/01/23 1050 03/02/23 1118 03/03/23 0508  NA 134* 138   < > 134* 134* 132*  K 4.1 3.7   < > 3.4* 3.5 4.3  CL 102 104   < > 100 100 104  CO2 19* 20*   < > 23 23 21*  GLUCOSE 165* 142*   < > 107* 91 156*  BUN 14 14   < > 19 17 20   CREATININE 1.16* 1.06*   < > 0.96 0.96 0.79  CALCIUM 9.6 9.6   < > 9.3 9.5 9.1  GFRNONAA 57* >60   < > >60 >60 >60  PROT 6.3* 6.2*  --   --  5.9*  --   ALBUMIN 3.5 3.4*  --   --  2.9*  --   AST 52* 44*  --   --  30  --   ALT 27 28  --   --  15  --   ALKPHOS 177* 161*  --   --  106  --   BILITOT 0.6 0.7  --   --  0.6  --    < > = values in this interval not displayed.    Studies:  IR IMAGING GUIDED PORT INSERTION  Result Date: 03/02/2023 INDICATION: History of metastatic lung cancer. In need of durable intravenous access for the initiation of chemotherapy. EXAM: IMPLANTED PORT A CATH PLACEMENT WITH ULTRASOUND AND FLUOROSCOPIC GUIDANCE COMPARISON:  Neck CT-02/23/2023 MEDICATIONS: None ANESTHESIA/SEDATION: Moderate (conscious) sedation was employed during this procedure as administered by the Interventional Radiology RN. A total of Benadryl 25 mg and Fentanyl 25 mcg was administered intravenously. Moderate Sedation Time: 23 minutes. The patient's level of consciousness and vital signs were monitored continuously by radiology nursing throughout the procedure under my direct supervision. CONTRAST:  None FLUOROSCOPY TIME:  24 seconds (4 mGy) COMPLICATIONS: None immediate. PROCEDURE: The procedure, risks, benefits, and alternatives were explained to the patient. Questions regarding the procedure were encouraged and answered. The patient understands and consents to the procedure. Given presence of known right supraclavicular bulky lymphadenopathy, the decision was made to proceed with left internal jugular approach port a catheter placement. As such, the left neck and chest were prepped with chlorhexidine in a sterile fashion, and a sterile drape was applied covering the  operative field. Maximum barrier sterile technique with sterile gowns and gloves were used for the procedure. A timeout was performed prior to the initiation of the procedure. Local anesthesia was provided with 1% lidocaine with epinephrine. After creating a small venotomy incision, a micropuncture kit was utilized to access the internal jugular vein. Real-time ultrasound guidance was utilized for vascular access including the acquisition of a permanent ultrasound image documenting patency of the accessed vessel. The microwire was utilized to measure appropriate catheter length. A subcutaneous port pocket was then created along the upper chest wall utilizing a combination of sharp and blunt dissection. The pocket was irrigated with sterile saline. A single lumen clear view power injectable port was chosen for placement. The 8 Fr catheter was tunneled from the port pocket site to the venotomy incision. The port was placed in the pocket. The external catheter was trimmed to appropriate length. At the venotomy, an 8 Fr peel-away sheath was placed over a guidewire under fluoroscopic guidance. The catheter was then placed through the sheath and the sheath was removed. Final catheter positioning was confirmed and documented with a fluoroscopic spot radiograph. The port was accessed with a Huber needle, aspirated and flushed with heparinized saline. The venotomy site was closed with an interrupted 4-0 Vicryl suture. The port pocket incision was closed with interrupted 2-0 Vicryl suture. Dermabond and Steri-strips were applied to both incisions. Dressings were applied. The patient tolerated the procedure well without immediate post procedural complication. FINDINGS: After catheter placement, the tip lies within the superior cavoatrial junction. The catheter aspirates and flushes normally and is ready for immediate use. IMPRESSION: Successful placement of a left internal jugular approach power injectable Port-A-Cath. The  catheter is ready for immediate use. Electronically Signed   By: Simonne Come M.D.   On: 03/02/2023 15:05   CT BONE MARROW BIOPSY & ASPIRATION  Result Date: 03/02/2023 INDICATION: History of metastatic lung cancer, now with thrombocytopenia. Please perform CT-guided bone marrow biopsy for tissue diagnostic purposes. EXAM: CT-GUIDED BONE MARROW BIOPSY AND ASPIRATION MEDICATIONS: None ANESTHESIA/SEDATION: Moderate (conscious) sedation was employed during this procedure as administered by the Interventional Radiology RN. A total of Benadryl 25 mg, Versed 1 mg and Fentanyl 50 mcg was administered intravenously. Moderate Sedation Time: 10 minutes. The patient's level of consciousness and vital signs were monitored continuously by radiology nursing throughout the procedure under my direct supervision. COMPLICATIONS: None immediate. PROCEDURE: Informed consent was obtained from the patient following an explanation of the procedure, risks, benefits and alternatives. The patient understands, agrees and consents for the procedure. All questions were addressed. A time out was performed prior to the initiation of the  procedure. The patient was positioned prone and non-contrast localization CT was performed of the pelvis to demonstrate the iliac marrow spaces. The operative site was prepped and draped in the usual sterile fashion. Under sterile conditions and local anesthesia, a 22 gauge spinal needle was utilized for procedural planning. Next, an 11 gauge coaxial bone biopsy needle was advanced into the left iliac marrow space. Needle position was confirmed with CT imaging. Initially, a bone marrow aspiration was performed. Next, a bone marrow biopsy was obtained with the 11 gauge outer bone marrow device. The needle was removed and superficial hemostasis was obtained with manual compression. A dressing was applied. The patient tolerated the procedure well without immediate post procedural complication. IMPRESSION:  Successful CT guided left iliac bone marrow aspiration and core biopsy. Electronically Signed   By: Simonne Come M.D.   On: 03/02/2023 15:03   IR US CHEST  Result Date: 02/28/2023 CLINICAL DATA:  Evaluate for thoracentesis. EXAM: CHEST ULTRASOUND COMPARISON:  Chest radiograph 02/28/2023 FINDINGS: Small amount of right pleural fluid was identified. No percutaneous window for thoracentesis due to a flap of lung. Thoracentesis not performed. IMPRESSION: Small right pleural effusion. Thoracentesis not performed due to lack of a safe percutaneous window. Electronically Signed   By: Richarda Overlie M.D.   On: 02/28/2023 16:10   NM Bone Scan Whole Body  Result Date: 02/28/2023 CLINICAL DATA:  Lung cancer EXAM: NUCLEAR MEDICINE WHOLE BODY BONE SCAN TECHNIQUE: Whole body anterior and posterior images were obtained approximately 3 hours after intravenous injection of radiopharmaceutical. RADIOPHARMACEUTICALS:  21.0 mCi Technetium-46m MDP IV COMPARISON:  CT scan earlier June 2024 of multiple areas. FINDINGS: Physiologic distribution of radiotracer overall. There is some uptake involving the right midfoot consistent with known provided history of injury in the past. There is slight asymmetric uptake along the proximal left tibia from the other side. There is also some subtle areas of low uptake along the distal femoral metaphysis. Recommend dedicated x-rays. Mild areas of degenerative uptake such as along the extremities. Of note the kidneys are not as well seen as typical. Please correlate with clinical findings. IMPRESSION: Subtle asymmetric uptake involving the proximal left tibia. Slight low uptake along the distal femurs. Recommend dedicated x-rays if there is no known history. Atypical low uptake of the renal parenchyma. The rest of the findings do not suggest super scan but please correlate with the patient's renal function and other history. This may be technical. Electronically Signed   By: Karen Kays M.D.   On:  02/28/2023 14:40   DG CHEST PORT 1 VIEW  Result Date: 02/28/2023 CLINICAL DATA:  Pleural effusion on right. EXAM: PORTABLE CHEST 1 VIEW COMPARISON:  Radiographs 02/27/2023 and 02/23/2023. Abdominal CT 02/27/2023 and chest CT 02/23/2023. FINDINGS: 0845 hours. Mild patient rotation to the right. Grossly stable heart size and mediastinal contours with known extensive mediastinal lymphadenopathy. There are enlarging right greater than left pleural effusions with increasing atelectasis at both lung bases. No pneumothorax. The bones appear unchanged. Telemetry leads overlie the chest. IMPRESSION: 1. Enlarging right greater than left pleural effusions with increasing bibasilar atelectasis. 2. Grossly stable mediastinal lymphadenopathy from recent chest CT, presumed lung cancer. Electronically Signed   By: Carey Bullocks M.D.   On: 02/28/2023 12:55   MR BRAIN W WO CONTRAST  Result Date: 02/28/2023 CLINICAL DATA:  Staging of small cell lung carcinoma EXAM: MRI HEAD WITHOUT AND WITH CONTRAST TECHNIQUE: Multiplanar, multiecho pulse sequences of the brain and surrounding structures were obtained without and with  intravenous contrast. CONTRAST:  7mL GADAVIST GADOBUTROL 1 MMOL/ML IV SOLN COMPARISON:  None Available. FINDINGS: Brain: No acute infarct, mass effect or extra-axial collection. No acute or chronic hemorrhage. Normal white matter signal. On diffusion-weighted imaging and the FLAIR sequence, there are bilateral convexity signal abnormalities within the subdural space. The midline structures are normal. There is smooth, diffuse pachymeningeal contrast enhancement. There are no intraparenchymal contrast enhancing lesions. Vascular: Major flow voids are preserved. Skull and upper cervical spine: Normal calvarium and skull base. Visualized upper cervical spine and soft tissues are normal. Sinuses/Orbits:Mastoid effusions. Paranasal sinuses are clear. Normal orbits. IMPRESSION: 1. No intraparenchymal metastatic  disease. 2. Bilateral convexity signal abnormalities within the subdural space, which may indicate aging subdural blood. 3. Smooth, diffuse pachymeningeal contrast enhancement. 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 Metastatic disease felt less likely given the lack nodularity. Electronically Signed   By: Deatra Robinson M.D.   On: 02/28/2023 02:17   DG CHEST PORT 1 VIEW  Result Date: 02/27/2023 CLINICAL DATA:  Shortness of breath. EXAM: PORTABLE CHEST 1 VIEW COMPARISON:  February 23, 2023. FINDINGS: Stable cardiomegaly. Mild central pulmonary vascular congestion is noted. Minimal bibasilar pulmonary edema is noted with small right pleural effusion. Bony thorax is unremarkable. IMPRESSION: Stable cardiomegaly with mild central pulmonary vascular congestion. Minimal bibasilar pulmonary edema is noted with probable small right pleural effusion. Electronically Signed   By: Lupita Raider M.D.   On: 02/27/2023 16:27   CT ABDOMEN PELVIS W CONTRAST  Result Date: 02/27/2023 CLINICAL DATA:  Non-small-cell lung cancer. Staging. * Tracking Code: BO * EXAM: CT ABDOMEN AND PELVIS WITH CONTRAST TECHNIQUE: Multidetector CT imaging of the abdomen and pelvis was performed using the standard protocol following bolus administration of intravenous contrast. RADIATION DOSE REDUCTION: This exam was performed according to the departmental dose-optimization program which includes automated exposure control, adjustment of the mA and/or kV according to patient size and/or use of iterative reconstruction technique. CONTRAST:  75mL OMNIPAQUE IOHEXOL 350 MG/ML SOLN COMPARISON:  Chest CTA 02/23/2023 FINDINGS: Lower chest: Bibasilar collapse/consolidation with bilateral pleural effusions, right greater than left. Pleural effusions are progressive since the chest CT 4 days ago. Hepatobiliary: Small area of low attenuation in  the anterior liver, adjacent to the falciform ligament, is in a characteristic location for focal fatty deposition. 9 mm hypodensity in the left liver (15/3) is too small to characterize but statistically is likely benign. There is no evidence for gallstones, gallbladder wall thickening, or pericholecystic fluid. No intrahepatic or extrahepatic biliary dilation. Pancreas: No focal mass lesion. No dilatation of the main duct. No intraparenchymal cyst. No peripancreatic edema. Spleen: No splenomegaly. No suspicious focal mass lesion. Adrenals/Urinary Tract: No adrenal nodule or mass. Kidneys unremarkable. No evidence for hydroureter. The urinary bladder appears normal for the degree of distention. Stomach/Bowel: Stomach is distended with food and fluid. Duodenum is normally positioned as is the ligament of Treitz. No small bowel wall thickening. No small bowel dilatation. The terminal ileum is normal. The appendix is normal. No gross colonic mass. No colonic wall thickening. Vascular/Lymphatic: Moderate atherosclerotic calcification is noted in the wall of the aorta. There is no gastrohepatic or hepatoduodenal ligament lymphadenopathy. No retroperitoneal or mesenteric lymphadenopathy. No pelvic sidewall lymphadenopathy. Reproductive: Unremarkable. Other: No intraperitoneal free fluid. Musculoskeletal: No worrisome lytic or sclerotic osseous abnormality. IMPRESSION: 1. No definite evidence for metastatic disease in the abdomen or pelvis. 2. 9  mm hypodensity in the left liver is too small to characterize but statistically is likely benign. Attention on follow-up recommended. 3. Bibasilar collapse/consolidation with bilateral pleural effusions, right greater than left. Pleural effusions are progressive since the chest CT 4 days ago. 4.  Aortic Atherosclerosis (ICD10-I70.0). Electronically Signed   By: Kennith Center M.D.   On: 02/27/2023 11:47   [place] CORE BIOPSY (SOFT TISSUE)  Result Date: 02/26/2023 INDICATION:  Cervical and supraclavicular adenopathy.  No diagnosis. EXAM: ULTRASOUND-GUIDED RIGHT SUPRACLAVICULAR NODAL MASS BIOPSY COMPARISON:  CT neck and chest, 09/24/2022. MEDICATIONS: None ANESTHESIA/SEDATION: Local anesthetic was administered. COMPLICATIONS: None immediate. TECHNIQUE: Informed written consent was obtained from the patient and/or patient's representative after a discussion of the risks, benefits and alternatives to treatment. Questions regarding the procedure were encouraged and answered. Initial ultrasound scanning demonstrated RIGHT supraclavicular nodal mass. An ultrasound image was saved for documentation purposes. The procedure was planned. A timeout was performed prior to the initiation of the procedure. The operative was prepped and draped in the usual sterile fashion, and a sterile drape was applied covering the operative field. A timeout was performed prior to the initiation of the procedure. Local anesthesia was provided with 1% lidocaine with epinephrine. Under direct ultrasound guidance, an 18 gauge core needle device was utilized to obtain to obtain 3 core needle biopsies of the RIGHT supraclavicular nodal mass. The samples were placed in saline and submitted to pathology. The needle was removed and superficial hemostasis was achieved with manual compression. Post procedure scan was negative for significant hematoma. A dressing was applied. The patient tolerated the procedure well without immediate postprocedural complication. IMPRESSION: Successful core biopsy of RIGHT supraclavicular nodal mass. Roanna Banning, MD [facility] Electronically Signed   By: Roanna Banning M.D.   On: 02/26/2023 10:15   ECHOCARDIOGRAM COMPLETE  Result Date: 02/24/2023    ECHOCARDIOGRAM REPORT   Patient Name:   Ana Curry Date of Exam: 02/24/2023 Medical Rec #:  [id number]         Height:       68.0 in Accession #:    [id number]        Weight:        157.0 lb Date of Birth:  February 17, 1971        BSA:          1.844 m Patient Age:    51 years          BP:           111/73 mmHg Patient Gender: F                 HR:           88 bpm. Exam Location:  Inpatient Procedure: 2D Echo, Cardiac Doppler and Color Doppler Indications:    A-FIB  History:        Patient has no prior history of Echocardiogram examinations.                 Arrythmias:Atrial Fibrillation; Risk Factors:Current Smoker.  Sonographer:    Darlys Gales Referring Phys: [phone] JACOB J STINSON IMPRESSIONS  1. Left ventricular ejection fraction, by estimation, is 60 to 65%. The left ventricle has normal function. The left ventricle has no regional wall motion abnormalities. Left ventricular diastolic parameters are indeterminate.  2. Right ventricular systolic function is normal. The right ventricular size is normal. Tricuspid regurgitation signal is inadequate for assessing PA pressure.  3. The mitral valve is normal in  structure. No evidence of mitral valve regurgitation.  4. The aortic valve is grossly normal. Aortic valve regurgitation is not visualized.  5. The inferior vena cava is dilated in size with <50% respiratory variability, suggesting right atrial pressure of 15 mmHg. FINDINGS  Left Ventricle: Left ventricular ejection fraction, by estimation, is 60 to 65%. The left ventricle has normal function. The left ventricle has no regional wall motion abnormalities. The left ventricular internal cavity size was normal in size. There is  no left ventricular hypertrophy. Left ventricular diastolic parameters are indeterminate. Right Ventricle: The right ventricular size is normal. Right ventricular systolic function is normal. Tricuspid regurgitation signal is inadequate for assessing PA pressure. Left Atrium: Left atrial size was normal in size. Right Atrium: Right atrial size was normal in size. Pericardium: There is no evidence of pericardial effusion. Mitral Valve: The mitral valve is normal in  structure. No evidence of mitral valve regurgitation. Tricuspid Valve: Tricuspid valve regurgitation is not demonstrated. Aortic Valve: The aortic valve is grossly normal. Aortic valve regurgitation is not visualized. Aortic valve mean gradient measures 5.0 mmHg. Aortic valve peak gradient measures 7.6 mmHg. Aortic valve area, by VTI measures 2.82 cm. Pulmonic Valve: Pulmonic valve regurgitation is not visualized. Aorta: The aortic root and ascending aorta are structurally normal, with no evidence of dilitation. Venous: The inferior vena cava is dilated in size with less than 50% respiratory variability, suggesting right atrial pressure of 15 mmHg. IAS/Shunts: The interatrial septum was not well visualized.  LEFT VENTRICLE PLAX 2D LVIDd:         5.10 cm   Diastology LVIDs:         3.00 cm   LV e' medial:    7.07 cm/s LV PW:         0.80 cm   LV E/e' medial:  12.3 LV IVS:        0.70 cm   LV e' lateral:   10.20 cm/s LVOT diam:     1.80 cm   LV E/e' lateral: 8.5 LV SV:         76 LV SV Index:   41 LVOT Area:     2.54 cm  RIGHT VENTRICLE             IVC RV S prime:     15.10 cm/s  IVC diam: 2.50 cm TAPSE (M-mode): 2.5 cm LEFT ATRIUM             Index        RIGHT ATRIUM           Index LA Vol (A2C):   21.7 ml 11.77 ml/m  RA Area:     14.00 cm LA Vol (A4C):   27.3 ml 14.81 ml/m  RA Volume:   34.60 ml  18.76 ml/m LA Biplane Vol: 24.7 ml 13.40 ml/m  AORTIC VALVE AV Area (Vmax):    2.42 cm AV Area (Vmean):   2.26 cm AV Area (VTI):     2.82 cm AV Vmax:           138.00 cm/s AV Vmean:          102.000 cm/s AV VTI:            0.269 m AV Peak Grad:      7.6 mmHg AV Mean Grad:      5.0 mmHg LVOT Vmax:         131.00 cm/s LVOT Vmean:        90.500 cm/s LVOT  VTI:          0.298 m LVOT/AV VTI ratio: 1.11  AORTA Ao Root diam: 3.20 cm Ao Asc diam:  3.50 cm MITRAL VALVE MV Area (PHT): 5.23 cm     SHUNTS MV Decel Time: 145 msec     Systemic VTI:  0.30 m MV E velocity: 87.00 cm/s   Systemic Diam: 1.80 cm MV A velocity:  102.00 cm/s MV E/A ratio:  0.85 Photographer signed by Carolan Clines Signature Date/Time: 02/24/2023/3:44:27 PM    Final    CT Angio Chest PE W and/or Wo Contrast  Result Date: 02/23/2023 CLINICAL DATA:  Shortness of breath, right neck mass, chest mass, headache * Tracking Code: BO * EXAM: CT ANGIOGRAPHY CHEST WITH CONTRAST TECHNIQUE: Multidetector CT imaging of the chest was performed using the standard protocol during bolus administration of intravenous contrast. Multiplanar CT image reconstructions and MIPs were obtained to evaluate the vascular anatomy. RADIATION DOSE REDUCTION: This exam was performed according to the departmental dose-optimization program which includes automated exposure control, adjustment of the mA and/or kV according to patient size and/or use of iterative reconstruction technique. CONTRAST:  75mL OMNIPAQUE IOHEXOL 350 MG/ML SOLN COMPARISON:  None Available. FINDINGS: Cardiovascular: No filling defect is identified in the pulmonary arterial tree to suggest pulmonary embolus. Mild atheromatous vascular calcification of the aortic arch. The large mediastinal mass bulges into the SVC posteriorly for example on image 128 series 4, probably from extrinsic compression, definite invasion is not observed. Because of lack of complete opacification from pulmonary arterial timing, patency of the left brachiocephalic vein is uncertain. Moderate pericardial effusion. Mediastinum/Nodes: Large right infrahilar and hilar mass extending into the mediastinum and tracking all the way to the thoracic inlet. This extends around the right mainstem bronchus, right upper lobe bronchus, and bronchus intermedius as well as the right lower lobe and right middle lobe bronchi, and likewise surrounds and narrows the right pulmonary artery and its branches. In the subcarinal region, the conglomerate mass measures about 7.9 by 5.6 cm (image 144, series 4) and the mass flattens and narrows the SVC without  overt occlusion. In the right infrahilar region, the observed mass measures 4.7 by 3.9 cm on image 179 series 4. Presumed lymph node anterior to the SVC in the upper chest measures 2.6 cm in short axis on image 36 series 3. A right lower neck level IV mass/lymph node measures 3.4 cm in short axis on image 10 series 3. Lungs/Pleura: The mass in the right chest displaces the upper trachea to the left. There is substantial narrowing of the right upper lobe bronchus, right middle lobe bronchus, and severe narrowing of the right lower lobe bronchus due to the surrounding mass. Patchy regions of nodularity in the right lower lobe distal to the mass may represent postobstructive pneumonitis or satellite tumor nodules. Centrilobular emphysema. Scarring or atelectasis in the right middle lobe and right upper lobe anteriorly. Mild dependent atelectasis in both lower lobes. Trace right pleural effusion, image 50 series 3. Upper Abdomen: Small nonspecific hypodense lesion in the left hepatic lobe 0.9 cm in long axis on image 96 series 3. Musculoskeletal: Unremarkable Review of the MIP images confirms the above findings. IMPRESSION: 1. Large right infrahilar and hilar mass extending into the mediastinum and tracking all the way to the thoracic inlet. This extends around the right mainstem bronchus, right upper lobe bronchus, and bronchus intermedius as well as the right lower lobe and right middle lobe bronchi, and likewise surrounds and  narrows the right pulmonary artery and its branches. The mass flattens and narrows the SVC without overt occlusion, and is confluent with bulky right level IV neck adenopathy. Top differential diagnostic considerations include lung cancer (such as squamous cell or small cell) versus extensive thoracic and right lower neck lymphoma. 2. Patchy regions of nodularity in the right lower lobe distal to the mass may represent postobstructive pneumonitis or satellite tumor nodules. 3. Presumed lymph  node anterior to the SVC in the upper chest measures 2.6 cm in short axis. 4. A right lower neck level IV mass/lymph node measures 3.4 cm in short axis, compatible with malignancy. 5. Moderate pericardial effusion. 6. Trace right pleural effusion. 7. Centrilobular emphysema. 8. Small nonspecific hypodense lesion in the left hepatic lobe 0.9 cm in long axis. 9. No pulmonary embolus is identified. 10. Aortic atherosclerosis. Aortic Atherosclerosis (ICD10-I70.0) and Emphysema (ICD10-J43.9). Electronically Signed   By: Gaylyn Rong M.D.   On: 02/23/2023 13:39   CT Soft Tissue Neck W Contrast  Result Date: 02/23/2023 CLINICAL DATA:  Neck mass, nonpulsatile right sided neck mass EXAM: CT NECK WITH CONTRAST TECHNIQUE: Multidetector CT imaging of the neck was performed using the standard protocol following the bolus administration of intravenous contrast. RADIATION DOSE REDUCTION: This exam was performed according to the departmental dose-optimization program which includes automated exposure control, adjustment of the mA and/or kV according to patient size and/or use of iterative reconstruction technique. CONTRAST:  75mL OMNIPAQUE IOHEXOL 350 MG/ML SOLN COMPARISON:  None Available. FINDINGS: Pharynx and larynx: Normal. No mass or swelling. Salivary glands: No inflammation, mass, or stone. Thyroid: Normal. Lymph nodes: There is bulky lower cervical and mediastinal lymphadenopathy involving the right level 3 and level 4 lymph node stations, the right supraclavicular region, and the anterior and posterior mediastinum. Cervical lymphadenopathy significant mass effect on the right internal jugular vein (series 5, image 68). Vascular: Negative. Mass effect on the right internal jugular vein, as above Limited intracranial: Negative. Visualized orbits: Negative. Mastoids and visualized paranasal sinuses: Moderate left-sided mastoid effusion. There is pneumatization of the petrous apices with air-fluid levels. Paranasal  sinuses are clear. Orbits are unremarkable. Skeleton: No acute or aggressive process. Upper chest: Mild paraseptal emphysema. See same day CT chest for additional findings Other: None. IMPRESSION: 1. Bulky right lower cervical and mediastinal lymphadenopathy, concerning for metastatic disease or lymphoma. 2. See separate CT chest for additional findings. Emphysema (ICD10-J43.9). Electronically Signed   By: Lorenza Cambridge M.D.   On: 02/23/2023 13:17   CT Head Wo Contrast  Result Date: 02/23/2023 CLINICAL DATA:  Headache EXAM: CT HEAD WITHOUT CONTRAST TECHNIQUE: Contiguous axial images were obtained from the base of the skull through the vertex without intravenous contrast. RADIATION DOSE REDUCTION: This exam was performed according to the departmental dose-optimization program which includes automated exposure control, adjustment of the mA and/or kV according to patient size and/or use of iterative reconstruction technique. COMPARISON:  None Available. FINDINGS: Brain: There is no acute intracranial hemorrhage, extra-axial fluid collection, or acute infarct Parenchymal volume is normal. The ventricles are normal in size. Gray-white differentiation is preserved The pituitary and suprasellar region are normal. There is no mass lesion. There is no mass effect or midline shift. Vascular: No hyperdense vessel or unexpected calcification. Skull: Normal. Negative for fracture or focal lesion. Sinuses/Orbits: The imaged paranasal sinuses are clear. The globes and orbits are unremarkable. Other: There are small bilateral mastoid effusions. IMPRESSION: 1. No acute intracranial pathology. 2. Small bilateral mastoid effusions. Electronically Signed  By: Lesia Hausen M.D.   On: 02/23/2023 13:15   DG Chest Port 1 View  Result Date: 02/23/2023 CLINICAL DATA:  Sepsis EXAM: PORTABLE CHEST 1 VIEW COMPARISON:  X-ray 01/09/2023 FINDINGS: No pneumothorax, effusion or edema. Calcified aorta. Overlapping cardiac leads. There is new  widening of the mediastinum compared to prior x-ray and fullness of the right lung hilum. Recommend further evaluation with CT to delineate for potential mass. IMPRESSION: New widening of the mediastinum with masslike fullness in the right lung hilum. Recommend follow up contrast CT to further delineate Electronically Signed   By: Karen Kays M.D.   On: 02/23/2023 13:11

## 2023-03-03 NOTE — Progress Notes (Signed)
Pt with O2 walk test, Pt ambulating hallway, half halway with 115P and 86% O2 on room air,  recovered within 2 minutes to 89% O2 abd 107 pulse.  Continued to length of hall with 118 pulse and 84% O2 within 2 mins, walked full hall to room with 116 HR and 88% O2 recovered within 2 minutes to 111 HR and 90% O2.  Pt with increased respirations with walking, but recovers quickly.  Pt used front wheel walker to ambulate hallway without any other assistance.  Educated on breathing pattern to help return to normal respirations soon.  Pt with some anxiety as she has never been short of breath walking.  Pt verbalizes understanding of use of oxygen and rest to help improve HR and O2 percent.

## 2023-03-03 NOTE — Progress Notes (Signed)
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  Prior to hospitalization there was suspicion for pneumonia which was treated by PCP with antibiotics.  Upon admission CT of the chest showed right infrahilar and hilar mass extending around right main stem bronchus, right upper lobe into thoracic inlet with bulky adenopathy.  EKG also revealed atrial fibrillation with RVR requiring IV Cardizem.  Patient was admitted to [facility] for multispecialty evaluation.  Cardiology, PCCM and oncology was consulted.  IR performed cervical node biopsy on 6/17 which eventually confirmed small cell carcinoma and due to concerns of SVC syndrome patient was transferred to [facility] for further management.  Patient also underwent Port-A-Cath placement with bone marrow biopsy on 6/22.   Assessment & Plan:  Principal Problem:   Lung mass Active Problems:   Atrial fibrillation with RVR (HCC)   Hypokalemia   Acute respiratory failure with hypoxia (HCC)   Thrombocytopenia (HCC)   COPD with acute exacerbation (HCC)   Lactic acidosis   Small cell carcinoma of lung, right (HCC)     Small cell carcinoma of right lung with extension of right infrahilar, right mainstem bronchus, right upper lobe and thoracic inlet adenopathy Tobacco use - Upon admission patient was seen by PCCM and oncology.  CT abdomen pelvis and MRI brain did not show any metastatic disease.  Eventually underwent Port-A-Cath placement and bone marrow biopsy at [facility] on 6/21 and thereafter transferred to [facility] and chemotherapy was started.  Currently monitoring for tumor lysis syndrome    Acute respiratory  failure with hypoxia COPD -Secondary to underlying pulmonary mass and likely has some extent of COPD.  Initially thoracentesis was attempted but not enough fluid was removed.  Currently will place her on supplemental oxygen as needed.  Bronchodilators, I-S/flutter valve. Likely secondary to right lung mass/concomitant COPD/pleural effusion.    CT abdomen/pelvis done showed bilateral lower lung collapse/consolidations, pleural effusion.  CXR showed bilateral pleural effusion more than right.  Thoracentesis attempted but not enough fluid to be removed.  She qualified for oxygen for 3 L at home.    Gram-positive bacteremia: -Most likely contaminated.  No obvious evidence of infection at this time    A-fib with RVR: Likely paroxysmal -Seen by cardiology.  Initially treated with Cardizem and amiodarone drip.  Did not recommend anticoagulation - Now transition to amiodarone 200 mg p.o. daily with plans to follow-up outpatient with Dr. Sharyn Lull   Lactic acidosis -Resolved   Thrombocytopenia:  Suspecting from underlying malignancy   Hypokalemia Replete as needed    DVT prophylaxis: SCDs Start: 02/23/23 2132 Code Status: Full code Family Communication:   Status is: Inpatient Continue hospital stay until cleared by oncology       Diet Orders (From admission, onward)     Start     Ordered   03/02/23 1041  Diet regular Room service appropriate? Yes; Fluid consistency: Thin  Diet effective now       Question Answer Comment  Room service appropriate? Yes   Fluid consistency: Thin      03/02/23 1040            Subjective: Seen at bedside no complaints at this time.  Requesting her pain medications to  be increased   Examination:  General exam: Appears calm and comfortable  Respiratory system: Clear to auscultation. Respiratory effort normal. Cardiovascular system: S1 & S2 heard, RRR. No JVD, murmurs, rubs, gallops or clicks. No pedal edema. Gastrointestinal system: Abdomen  is nondistended, soft and nontender. No organomegaly or masses felt. Normal bowel sounds heard. Central nervous system: Alert and oriented. No focal neurological deficits. Extremities: Symmetric 5 x 5 power. Skin: No rashes, lesions or ulcers Psychiatry: Judgement and insight appear normal. Mood & affect appropriate. Right chest wall port in place  Objective: Vitals:   03/02/23 1810 03/02/23 2146 03/03/23 0530 03/03/23 0632  BP: 95/60 111/69 (!) 84/48 111/69  Pulse: 84 85 87 90  Resp: 16 18 18    Temp: 98.1 F (36.7 C) 98.1 F (36.7 C) 97.8 F (36.6 C)   TempSrc: Oral Oral Oral   SpO2: 94% 93% 93%   Weight:      Height:        Intake/Output Summary (Last 24 hours) at 03/03/2023 0746 Last data filed at 03/03/2023 0358 Gross per 24 hour  Intake 2345.7 ml  Output --  Net 2345.7 ml   Filed Weights   02/23/23 1108  Weight: 71.2 kg    Scheduled Meds:  allopurinol  300 mg Oral BID   amiodarone  200 mg Oral Daily   budesonide (PULMICORT) nebulizer solution  0.5 mg Nebulization BID   dicyclomine  20 mg Oral Daily   etoposide  81 mg/m2 (Treatment Plan Recorded) Intravenous Once   [START ON 03/04/2023] etoposide  81 mg/m2 (Treatment Plan Recorded) Intravenous Once   fluticasone furoate-vilanterol  1 puff Inhalation Daily   nicotine  21 mg Transdermal Daily   polyethylene glycol  17 g Oral Daily   predniSONE  40 mg Oral Q breakfast   senna-docusate  1 tablet Oral BID   Continuous Infusions:  sodium chloride 75 mL/hr at 03/03/23 0358   dexamethasone (DECADRON) IVPB ([facility])     [START ON 03/04/2023] dexamethasone (DECADRON) IVPB ([facility])      Nutritional status     Body mass index is 23.87 kg/m.  Data Reviewed:   CBC: Recent Labs  Lab 02/26/23 0151 02/28/23 0639 03/01/23 1050 03/02/23 0930 03/03/23 0508  WBC 8.1 7.0 5.4 7.5 8.2  HGB 10.6* 11.7* 11.6* 11.6* 10.8*  HCT 31.2* 33.5* 34.8* 34.9* 32.6*  MCV 96.6 94.9 94.8 94.6 97.0  PLT 44* 51* 47* 48* 50*   Basic  Metabolic Panel: Recent Labs  Lab 02/26/23 0151 02/28/23 0547 03/01/23 1050 03/02/23 1118 03/03/23 0508  NA 138 137 134* 134* 132*  K 3.8 3.2* 3.4* 3.5 4.3  CL 104 101 100 100 104  CO2 20* 21* 23 23 21*  GLUCOSE 132* 112* 107* 91 156*  BUN 17 16 19 17 20   CREATININE 1.09* 1.04* 0.96 0.96 0.79  CALCIUM 9.4 8.9 9.3 9.5 9.1   GFR: Estimated Creatinine Clearance: 83 mL/min (by C-G formula based on SCr of 0.79 mg/dL). Liver Function Tests: Recent Labs  Lab 02/25/23 0142 03/02/23 1118  AST 44* 30  ALT 28 15  ALKPHOS 161* 106  BILITOT 0.7 0.6  PROT 6.2* 5.9*  ALBUMIN 3.4* 2.9*   No results for input(s): "LIPASE", "AMYLASE" in the last 168 hours. No results for input(s): "AMMONIA" in the last 168 hours. Coagulation Profile: Recent Labs  Lab 02/24/23 1014  INR 1.1   Cardiac Enzymes: No results for input(s): "CKTOTAL", "CKMB", "CKMBINDEX", "TROPONINI" in the last 168 hours. BNP (last 3 results)  No results for input(s): "PROBNP" in the last 8760 hours. HbA1C: No results for input(s): "HGBA1C" in the last 72 hours. CBG: No results for input(s): "GLUCAP" in the last 168 hours. Lipid Profile: No results for input(s): "CHOL", "HDL", "LDLCALC", "TRIG", "CHOLHDL", "LDLDIRECT" in the last 72 hours. Thyroid Function Tests: No results for input(s): "TSH", "T4TOTAL", "FREET4", "T3FREE", "THYROIDAB" in the last 72 hours. Anemia Panel: No results for input(s): "VITAMINB12", "FOLATE", "FERRITIN", "TIBC", "IRON", "RETICCTPCT" in the last 72 hours. Sepsis Labs: Recent Labs  Lab 02/26/23 0151 02/27/23 0604 02/27/23 1252 02/28/23 0547 03/01/23 1050  PROCALCITON  --   --  <0.10  --   --   LATICACIDVEN 2.7* 3.0*  --  2.3* 1.9    Recent Results (from the past 240 hour(s))  Culture, blood (Routine x 2)     Status: None   Collection Time: 02/23/23 11:28 AM   Specimen: BLOOD  Result Value Ref Range Status   Specimen Description BLOOD BLOOD RIGHT ARM  Final   Special Requests    Final    BOTTLES DRAWN AEROBIC AND ANAEROBIC Blood Culture adequate volume   Culture   Final    NO GROWTH 5 DAYS Performed at [facility], [address], [place], [place] [address]    Report Status 02/28/2023 FINAL  Final  Culture, blood (Routine x 2)     Status: None   Collection Time: 02/23/23 11:29 AM   Specimen: Left Antecubital; Blood  Result Value Ref Range Status   Specimen Description   Final    LEFT ANTECUBITAL BLOOD Performed at [facility], [address] [place], [place] [address]    Special Requests   Final    BOTTLES DRAWN AEROBIC AND ANAEROBIC Blood Culture adequate volume Performed at [facility], [address] [place], [place] [address]    Culture  Setup Time   Final    AEROBIC BOTTLE ONLY GRAM POSITIVE COCCI Gram Stain Report Called to,Read Back By and Verified With: WEST,MIKE ([place]) @ 1105 ON 02/27/2023 BY FRATTO,ASHLEY CRITICAL RESULT CALLED TO, READ BACK BY AND VERIFIED WITH: PHARMD L. CHEN [id number] @ 1840 FH    Culture   Final    GRAM POSITIVE COCCI DERMACOCCUS NISHINOMIYAENSIS Standardized susceptibility testing for this organism is not available. Performed at [facility], [address], [place], [place] [address]    Report Status 03/02/2023 FINAL  Final  Blood Culture ID Panel (Reflexed)     Status: None   Collection Time: 02/23/23 11:29 AM  Result Value Ref Range Status   Enterococcus faecalis NOT DETECTED NOT DETECTED Final   Enterococcus Faecium NOT DETECTED NOT DETECTED Final   Listeria monocytogenes NOT DETECTED NOT DETECTED Final   Staphylococcus species NOT DETECTED NOT DETECTED Final   Staphylococcus aureus (BCID) NOT DETECTED NOT DETECTED Final   Staphylococcus epidermidis NOT DETECTED NOT DETECTED Final   Staphylococcus lugdunensis NOT DETECTED NOT DETECTED Final   Streptococcus species NOT DETECTED NOT DETECTED Final   Streptococcus agalactiae NOT DETECTED NOT DETECTED Final   Streptococcus pneumoniae NOT DETECTED  NOT DETECTED Final   Streptococcus pyogenes NOT DETECTED NOT DETECTED Final   A.calcoaceticus-baumannii NOT DETECTED NOT DETECTED Final   Bacteroides fragilis NOT DETECTED NOT DETECTED Final   Enterobacterales NOT DETECTED NOT DETECTED Final   Enterobacter cloacae complex NOT DETECTED NOT DETECTED Final   Escherichia coli NOT DETECTED NOT DETECTED Final   Klebsiella aerogenes NOT DETECTED NOT DETECTED Final   Klebsiella oxytoca NOT DETECTED NOT  DETECTED Final   Klebsiella pneumoniae NOT DETECTED NOT DETECTED Final   Proteus species NOT DETECTED NOT DETECTED Final   Salmonella species NOT DETECTED NOT DETECTED Final   Serratia marcescens NOT DETECTED NOT DETECTED Final   Haemophilus influenzae NOT DETECTED NOT DETECTED Final   Neisseria meningitidis NOT DETECTED NOT DETECTED Final   Pseudomonas aeruginosa NOT DETECTED NOT DETECTED Final   Stenotrophomonas maltophilia NOT DETECTED NOT DETECTED Final   Candida albicans NOT DETECTED NOT DETECTED Final   Candida auris NOT DETECTED NOT DETECTED Final   Candida glabrata NOT DETECTED NOT DETECTED Final   Candida krusei NOT DETECTED NOT DETECTED Final   Candida parapsilosis NOT DETECTED NOT DETECTED Final   Candida tropicalis NOT DETECTED NOT DETECTED Final   Cryptococcus neoformans/gattii NOT DETECTED NOT DETECTED Final    Comment: Performed at [facility], [address], [place], [place] [address]  Culture, blood (Routine X 2) w Reflex to ID Panel     Status: None (Preliminary result)   Collection Time: 02/27/23 12:52 PM   Specimen: BLOOD RIGHT HAND  Result Value Ref Range Status   Specimen Description BLOOD RIGHT HAND  Final   Special Requests   Final    BOTTLES DRAWN AEROBIC AND ANAEROBIC Blood Culture adequate volume   Culture   Final    NO GROWTH 3 DAYS Performed at [facility], [address], [place], [place] [address]    Report Status PENDING  Incomplete  Culture, blood (Routine X 2) w Reflex to ID Panel      Status: None (Preliminary result)   Collection Time: 02/27/23 12:53 PM   Specimen: BLOOD  Result Value Ref Range Status   Specimen Description BLOOD SITE NOT SPECIFIED  Final   Special Requests   Final    BOTTLES DRAWN AEROBIC AND ANAEROBIC Blood Culture adequate volume   Culture   Final    NO GROWTH 3 DAYS Performed at [facility], [address] [place], [place] [address]    Report Status PENDING  Incomplete         Radiology Studies: IR IMAGING GUIDED PORT INSERTION  Result Date: 03/02/2023 INDICATION: History of metastatic lung cancer. In need of durable intravenous access for the initiation of chemotherapy. EXAM: IMPLANTED PORT A CATH PLACEMENT WITH ULTRASOUND AND FLUOROSCOPIC GUIDANCE COMPARISON:  Neck CT-02/23/2023 MEDICATIONS: None ANESTHESIA/SEDATION: Moderate (conscious) sedation was employed during this procedure as administered by the Interventional Radiology RN. A total of Benadryl 25 mg and Fentanyl 25 mcg was administered intravenously. Moderate Sedation Time: 23 minutes. The patient's level of consciousness and vital signs were monitored continuously by radiology nursing throughout the procedure under my direct supervision. CONTRAST:  None FLUOROSCOPY TIME:  24 seconds (4 mGy) COMPLICATIONS: None immediate. PROCEDURE: The procedure, risks, benefits, and alternatives were explained to the patient. Questions regarding the procedure were encouraged and answered. The patient understands and consents to the procedure. Given presence of known right supraclavicular bulky lymphadenopathy, the decision was made to proceed with left internal jugular approach port a catheter placement. As such, the left neck and chest were prepped with chlorhexidine in a sterile fashion, and a sterile drape was applied covering the operative field. Maximum barrier sterile technique with sterile gowns and gloves were used for the procedure. A timeout was performed prior to the initiation of the  procedure. Local anesthesia was provided with 1% lidocaine with epinephrine. After creating a small venotomy incision, a micropuncture kit was utilized to access the  internal jugular vein. Real-time ultrasound guidance was utilized for vascular access including the acquisition of a permanent ultrasound image documenting patency of the accessed vessel. The microwire was utilized to measure appropriate catheter length. A subcutaneous port pocket was then created along the upper chest wall utilizing a combination of sharp and blunt dissection. The pocket was irrigated with sterile saline. A single lumen clear view power injectable port was chosen for placement. The 8 Fr catheter was tunneled from the port pocket site to the venotomy incision. The port was placed in the pocket. The external catheter was trimmed to appropriate length. At the venotomy, an 8 Fr peel-away sheath was placed over a guidewire under fluoroscopic guidance. The catheter was then placed through the sheath and the sheath was removed. Final catheter positioning was confirmed and documented with a fluoroscopic spot radiograph. The port was accessed with a Huber needle, aspirated and flushed with heparinized saline. The venotomy site was closed with an interrupted 4-0 Vicryl suture. The port pocket incision was closed with interrupted 2-0 Vicryl suture. Dermabond and Steri-strips were applied to both incisions. Dressings were applied. The patient tolerated the procedure well without immediate post procedural complication. FINDINGS: After catheter placement, the tip lies within the superior cavoatrial junction. The catheter aspirates and flushes normally and is ready for immediate use. IMPRESSION: Successful placement of a left internal jugular approach power injectable Port-A-Cath. The catheter is ready for immediate use. Electronically Signed   By: Simonne Come M.D.   On: 03/02/2023 15:05   CT BONE MARROW BIOPSY & ASPIRATION  Result Date:  03/02/2023 INDICATION: History of metastatic lung cancer, now with thrombocytopenia. Please perform CT-guided bone marrow biopsy for tissue diagnostic purposes. EXAM: CT-GUIDED BONE MARROW BIOPSY AND ASPIRATION MEDICATIONS: None ANESTHESIA/SEDATION: Moderate (conscious) sedation was employed during this procedure as administered by the Interventional Radiology RN. A total of Benadryl 25 mg, Versed 1 mg and Fentanyl 50 mcg was administered intravenously. Moderate Sedation Time: 10 minutes. The patient's level of consciousness and vital signs were monitored continuously by radiology nursing throughout the procedure under my direct supervision. COMPLICATIONS: None immediate. PROCEDURE: Informed consent was obtained from the patient following an explanation of the procedure, risks, benefits and alternatives. The patient understands, agrees and consents for the procedure. All questions were addressed. A time out was performed prior to the initiation of the procedure. The patient was positioned prone and non-contrast localization CT was performed of the pelvis to demonstrate the iliac marrow spaces. The operative site was prepped and draped in the usual sterile fashion. Under sterile conditions and local anesthesia, a 22 gauge spinal needle was utilized for procedural planning. Next, an 11 gauge coaxial bone biopsy needle was advanced into the left iliac marrow space. Needle position was confirmed with CT imaging. Initially, a bone marrow aspiration was performed. Next, a bone marrow biopsy was obtained with the 11 gauge outer bone marrow device. The needle was removed and superficial hemostasis was obtained with manual compression. A dressing was applied. The patient tolerated the procedure well without immediate post procedural complication. IMPRESSION: Successful CT guided left iliac bone marrow aspiration and core biopsy. Electronically Signed   By: Simonne Come M.D.   On: 03/02/2023 15:03           LOS: 8 days    Time spent= 35 mins    Ana Socarras Joline Maxcy, MD [facility]  If 7PM-7AM, please contact night-coverage  03/03/2023, 7:46 AM

## 2023-03-04 DIAGNOSIS — R918 Other nonspecific abnormal finding of lung field: Secondary | ICD-10-CM | POA: Diagnosis not present

## 2023-03-04 LAB — COMPREHENSIVE METABOLIC PANEL
ALT: 20 U/L (ref 0–44)
AST: 33 U/L (ref 15–41)
Albumin: 2.6 g/dL — ABNORMAL LOW (ref 3.5–5.0)
Alkaline Phosphatase: 81 U/L (ref 38–126)
Anion gap: 11 (ref 5–15)
BUN: 16 mg/dL (ref 6–20)
CO2: 20 mmol/L — ABNORMAL LOW (ref 22–32)
Calcium: 8.8 mg/dL — ABNORMAL LOW (ref 8.9–10.3)
Chloride: 102 mmol/L (ref 98–111)
Creatinine, Ser: 0.9 mg/dL (ref 0.44–1.00)
GFR, Estimated: >60 mL/min (ref >60)
Glucose, Bld: 139 mg/dL — ABNORMAL HIGH (ref 70–99)
Potassium: 3.9 mmol/L (ref 3.5–5.1)
Sodium: 133 mmol/L — ABNORMAL LOW (ref 135–145)
Total Bilirubin: 0.5 mg/dL (ref 0.3–1.2)
Total Protein: 5.4 g/dL — ABNORMAL LOW (ref 6.5–8.1)

## 2023-03-04 LAB — CBC
HCT: 29.8 % — ABNORMAL LOW (ref 36.0–46.0)
Hemoglobin: 9.6 g/dL — ABNORMAL LOW (ref 12.0–15.0)
MCH: 31.8 pg (ref 26.0–34.0)
MCHC: 32.2 g/dL (ref 30.0–36.0)
MCV: 98.7 fL (ref 80.0–100.0)
Platelets: 42 10*3/uL — ABNORMAL LOW (ref 150–400)
RBC: 3.02 MIL/uL — ABNORMAL LOW (ref 3.87–5.11)
RDW: 14.4 % (ref 11.5–15.5)
WBC: 9.4 10*3/uL (ref 4.0–10.5)
nRBC: 0.3 % — ABNORMAL HIGH (ref 0.0–0.2)

## 2023-03-04 LAB — MAGNESIUM: Magnesium: 1.9 mg/dL (ref 1.7–2.4)

## 2023-03-04 LAB — LACTATE DEHYDROGENASE: LDH: 528 U/L — ABNORMAL HIGH (ref 98–192)

## 2023-03-04 LAB — URIC ACID: Uric Acid, Serum: 2.1 mg/dL — ABNORMAL LOW (ref 2.5–7.1)

## 2023-03-04 LAB — CULTURE, BLOOD (ROUTINE X 2): Special Requests: ADEQUATE

## 2023-03-04 LAB — BRAIN NATRIURETIC PEPTIDE: B Natriuretic Peptide: 70.4 pg/mL (ref 0.0–100.0)

## 2023-03-04 NOTE — Progress Notes (Signed)
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 She has started radiation on 03/02/23 She will start cycle 1 day 1 of chemo on 03/02/23, tolerated chemotherapy well so far No signs of tumor lysis syndrome   Pain from procedure She will continue pain medications as needed   Respiratory failure due to lung cancer Patient will need oxygen prior to discharge   Acquired pancytopenia Possible bone marrow involvement Proceed with chemo despite low platelet count   Low BP This has improved with IV fluids I recommend IVF daily for low BP as well as hydration to reduce risk of TLS   Goals of care Completion of chemo this weekend   Discharge planning She will likely be discharged tomorrow pending arrangement for oxygen I will sign off She has appointment to see me on Tuesday with chemotherapy plan in place I have addressed all her questions  All questions were answered. The patient knows to call the clinic with any problems, questions or concerns.   The total time spent in the appointment was 40 minutes encounter with patients including review of chart and various tests results, discussions about plan of care and coordination of care plan  Artis Delay, MD 03/04/2023 11:54 AM  Subjective:  She is seen in the room.  Her uncle is by the bedside.  I reviewed test results with her She tolerated treatment well except for persistent pain from her bone marrow biopsy I reviewed her oxygen saturation from recent test from last night She would likely need oxygen upon discharge Due to arrangement for oxygen, she will likely be here tomorrow  Objective:  Vitals:   03/04/23 0034 03/04/23 0853  BP: 122/67   Pulse: 91   Resp: 16   Temp: 97.9 F (36.6 C)   SpO2: 92% 94%     Intake/Output Summary (Last 24 hours) at 03/04/2023 1154 Last data filed at 03/04/2023 0539 Gross per 24 hour  Intake 120 ml  Output  1600 ml  Net -1480 ml    GENERAL:alert, no distress and comfortable NEURO: alert & oriented x 3 with fluent speech, no focal motor/sensory deficits   Labs:  Recent Labs    02/25/23 0142 02/26/23 0151 03/02/23 1118 03/03/23 0508 03/04/23 0500  NA 138   < > 134* 132* 133*  K 3.7   < > 3.5 4.3 3.9  CL 104   < > 100 104 102  CO2 20*   < > 23 21* 20*  GLUCOSE 142*   < > 91 156* 139*  BUN 14   < > 17 20 16   CREATININE 1.06*   < > 0.96 0.79 0.90  CALCIUM 9.6   < > 9.5 9.1 8.8*  GFRNONAA >60   < > >60 >60 >60  PROT 6.2*  --  5.9*  --  5.4*  ALBUMIN 3.4*  --  2.9*  --  2.6*  AST 44*  --  30  --  33  ALT 28  --  15  --  20  ALKPHOS 161*  --  106  --  81  BILITOT 0.7  --  0.6  --  0.5   < > = values in this interval not displayed.    Studies:  IR IMAGING GUIDED PORT INSERTION  Result Date: 03/02/2023 INDICATION: History of metastatic lung cancer. In need of durable intravenous access for the initiation of chemotherapy. EXAM: IMPLANTED PORT  A CATH PLACEMENT WITH ULTRASOUND AND FLUOROSCOPIC GUIDANCE COMPARISON:  Neck CT-02/23/2023 MEDICATIONS: None ANESTHESIA/SEDATION: Moderate (conscious) sedation was employed during this procedure as administered by the Interventional Radiology RN. A total of Benadryl 25 mg and Fentanyl 25 mcg was administered intravenously. Moderate Sedation Time: 23 minutes. The patient's level of consciousness and vital signs were monitored continuously by radiology nursing throughout the procedure under my direct supervision. CONTRAST:  None FLUOROSCOPY TIME:  24 seconds (4 mGy) COMPLICATIONS: None immediate. PROCEDURE: The procedure, risks, benefits, and alternatives were explained to the patient. Questions regarding the procedure were encouraged and answered. The patient understands and consents to the procedure. Given presence of known right supraclavicular bulky lymphadenopathy, the decision was made to proceed with left internal jugular approach port a catheter  placement. As such, the left neck and chest were prepped with chlorhexidine in a sterile fashion, and a sterile drape was applied covering the operative field. Maximum barrier sterile technique with sterile gowns and gloves were used for the procedure. A timeout was performed prior to the initiation of the procedure. Local anesthesia was provided with 1% lidocaine with epinephrine. After creating a small venotomy incision, a micropuncture kit was utilized to access the internal jugular vein. Real-time ultrasound guidance was utilized for vascular access including the acquisition of a permanent ultrasound image documenting patency of the accessed vessel. The microwire was utilized to measure appropriate catheter length. A subcutaneous port pocket was then created along the upper chest wall utilizing a combination of sharp and blunt dissection. The pocket was irrigated with sterile saline. A single lumen clear view power injectable port was chosen for placement. The 8 Fr catheter was tunneled from the port pocket site to the venotomy incision. The port was placed in the pocket. The external catheter was trimmed to appropriate length. At the venotomy, an 8 Fr peel-away sheath was placed over a guidewire under fluoroscopic guidance. The catheter was then placed through the sheath and the sheath was removed. Final catheter positioning was confirmed and documented with a fluoroscopic spot radiograph. The port was accessed with a Huber needle, aspirated and flushed with heparinized saline. The venotomy site was closed with an interrupted 4-0 Vicryl suture. The port pocket incision was closed with interrupted 2-0 Vicryl suture. Dermabond and Steri-strips were applied to both incisions. Dressings were applied. The patient tolerated the procedure well without immediate post procedural complication. FINDINGS: After catheter placement, the tip lies within the superior cavoatrial junction. The catheter aspirates and flushes  normally and is ready for immediate use. IMPRESSION: Successful placement of a left internal jugular approach power injectable Port-A-Cath. The catheter is ready for immediate use. Electronically Signed   By: Simonne Come M.D.   On: 03/02/2023 15:05   CT BONE MARROW BIOPSY & ASPIRATION  Result Date: 03/02/2023 INDICATION: History of metastatic lung cancer, now with thrombocytopenia. Please perform CT-guided bone marrow biopsy for tissue diagnostic purposes. EXAM: CT-GUIDED BONE MARROW BIOPSY AND ASPIRATION MEDICATIONS: None ANESTHESIA/SEDATION: Moderate (conscious) sedation was employed during this procedure as administered by the Interventional Radiology RN. A total of Benadryl 25 mg, Versed 1 mg and Fentanyl 50 mcg was administered intravenously. Moderate Sedation Time: 10 minutes. The patient's level of consciousness and vital signs were monitored continuously by radiology nursing throughout the procedure under my direct supervision. COMPLICATIONS: None immediate. PROCEDURE: Informed consent was obtained from the patient following an explanation of the procedure, risks, benefits and alternatives. The patient understands, agrees and consents for the procedure. All questions were addressed. A  time out was performed prior to the initiation of the procedure. The patient was positioned prone and non-contrast localization CT was performed of the pelvis to demonstrate the iliac marrow spaces. The operative site was prepped and draped in the usual sterile fashion. Under sterile conditions and local anesthesia, a 22 gauge spinal needle was utilized for procedural planning. Next, an 11 gauge coaxial bone biopsy needle was advanced into the left iliac marrow space. Needle position was confirmed with CT imaging. Initially, a bone marrow aspiration was performed. Next, a bone marrow biopsy was obtained with the 11 gauge outer bone marrow device. The needle was removed and superficial hemostasis was obtained with manual  compression. A dressing was applied. The patient tolerated the procedure well without immediate post procedural complication. IMPRESSION: Successful CT guided left iliac bone marrow aspiration and core biopsy. Electronically Signed   By: Simonne Come M.D.   On: 03/02/2023 15:03   IR US CHEST  Result Date: 02/28/2023 CLINICAL DATA:  Evaluate for thoracentesis. EXAM: CHEST ULTRASOUND COMPARISON:  Chest radiograph 02/28/2023 FINDINGS: Small amount of right pleural fluid was identified. No percutaneous window for thoracentesis due to a flap of lung. Thoracentesis not performed. IMPRESSION: Small right pleural effusion. Thoracentesis not performed due to lack of a safe percutaneous window. Electronically Signed   By: Richarda Overlie M.D.   On: 02/28/2023 16:10   NM Bone Scan Whole Body  Result Date: 02/28/2023 CLINICAL DATA:  Lung cancer EXAM: NUCLEAR MEDICINE WHOLE BODY BONE SCAN TECHNIQUE: Whole body anterior and posterior images were obtained approximately 3 hours after intravenous injection of radiopharmaceutical. RADIOPHARMACEUTICALS:  21.0 mCi Technetium-40m MDP IV COMPARISON:  CT scan earlier June 2024 of multiple areas. FINDINGS: Physiologic distribution of radiotracer overall. There is some uptake involving the right midfoot consistent with known provided history of injury in the past. There is slight asymmetric uptake along the proximal left tibia from the other side. There is also some subtle areas of low uptake along the distal femoral metaphysis. Recommend dedicated x-rays. Mild areas of degenerative uptake such as along the extremities. Of note the kidneys are not as well seen as typical. Please correlate with clinical findings. IMPRESSION: Subtle asymmetric uptake involving the proximal left tibia. Slight low uptake along the distal femurs. Recommend dedicated x-rays if there is no known history. Atypical low uptake of the renal parenchyma. The rest of the findings do not suggest super scan but please  correlate with the patient's renal function and other history. This may be technical. Electronically Signed   By: Karen Kays M.D.   On: 02/28/2023 14:40   DG CHEST PORT 1 VIEW  Result Date: 02/28/2023 CLINICAL DATA:  Pleural effusion on right. EXAM: PORTABLE CHEST 1 VIEW COMPARISON:  Radiographs 02/27/2023 and 02/23/2023. Abdominal CT 02/27/2023 and chest CT 02/23/2023. FINDINGS: 0845 hours. Mild patient rotation to the right. Grossly stable heart size and mediastinal contours with known extensive mediastinal lymphadenopathy. There are enlarging right greater than left pleural effusions with increasing atelectasis at both lung bases. No pneumothorax. The bones appear unchanged. Telemetry leads overlie the chest. IMPRESSION: 1. Enlarging right greater than left pleural effusions with increasing bibasilar atelectasis. 2. Grossly stable mediastinal lymphadenopathy from recent chest CT, presumed lung cancer. Electronically Signed   By: Carey Bullocks M.D.   On: 02/28/2023 12:55   MR BRAIN W WO CONTRAST  Result Date: 02/28/2023 CLINICAL DATA:  Staging of small cell lung carcinoma EXAM: MRI HEAD WITHOUT AND WITH CONTRAST TECHNIQUE: Multiplanar, multiecho pulse sequences of  the brain and surrounding structures were obtained without and with intravenous contrast. CONTRAST:  7mL GADAVIST GADOBUTROL 1 MMOL/ML IV SOLN COMPARISON:  None Available. FINDINGS: Brain: No acute infarct, mass effect or extra-axial collection. No acute or chronic hemorrhage. Normal white matter signal. On diffusion-weighted imaging and the FLAIR sequence, there are bilateral convexity signal abnormalities within the subdural space. The midline structures are normal. There is smooth, diffuse pachymeningeal contrast enhancement. There are no intraparenchymal contrast enhancing lesions. Vascular: Major flow voids are preserved. Skull and upper cervical spine: Normal calvarium and skull base. Visualized upper cervical spine and soft tissues are  normal. Sinuses/Orbits:Mastoid effusions. Paranasal sinuses are clear. Normal orbits. IMPRESSION: 1. No intraparenchymal metastatic disease. 2. Bilateral convexity signal abnormalities within the subdural space, which may indicate aging subdural blood. 3. Smooth, diffuse pachymeningeal contrast enhancement. 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 Metastatic disease felt less likely given the lack nodularity. Electronically Signed   By: Deatra Robinson M.D.   On: 02/28/2023 02:17   DG CHEST PORT 1 VIEW  Result Date: 02/27/2023 CLINICAL DATA:  Shortness of breath. EXAM: PORTABLE CHEST 1 VIEW COMPARISON:  February 23, 2023. FINDINGS: Stable cardiomegaly. Mild central pulmonary vascular congestion is noted. Minimal bibasilar pulmonary edema is noted with small right pleural effusion. Bony thorax is unremarkable. IMPRESSION: Stable cardiomegaly with mild central pulmonary vascular congestion. Minimal bibasilar pulmonary edema is noted with probable small right pleural effusion. Electronically Signed   By: Lupita Raider M.D.   On: 02/27/2023 16:27   CT ABDOMEN PELVIS W CONTRAST  Result Date: 02/27/2023 CLINICAL DATA:  Non-small-cell lung cancer. Staging. * Tracking Code: BO * EXAM: CT ABDOMEN AND PELVIS WITH CONTRAST TECHNIQUE: Multidetector CT imaging of the abdomen and pelvis was performed using the standard protocol following bolus administration of intravenous contrast. RADIATION DOSE REDUCTION: This exam was performed according to the departmental dose-optimization program which includes automated exposure control, adjustment of the mA and/or kV according to patient size and/or use of iterative reconstruction technique. CONTRAST:  75mL OMNIPAQUE IOHEXOL 350 MG/ML SOLN COMPARISON:  Chest CTA 02/23/2023 FINDINGS: Lower chest: Bibasilar collapse/consolidation with bilateral pleural effusions, right  greater than left. Pleural effusions are progressive since the chest CT 4 days ago. Hepatobiliary: Small area of low attenuation in the anterior liver, adjacent to the falciform ligament, is in a characteristic location for focal fatty deposition. 9 mm hypodensity in the left liver (15/3) is too small to characterize but statistically is likely benign. There is no evidence for gallstones, gallbladder wall thickening, or pericholecystic fluid. No intrahepatic or extrahepatic biliary dilation. Pancreas: No focal mass lesion. No dilatation of the main duct. No intraparenchymal cyst. No peripancreatic edema. Spleen: No splenomegaly. No suspicious focal mass lesion. Adrenals/Urinary Tract: No adrenal nodule or mass. Kidneys unremarkable. No evidence for hydroureter. The urinary bladder appears normal for the degree of distention. Stomach/Bowel: Stomach is distended with food and fluid. Duodenum is normally positioned as is the ligament of Treitz. No small bowel wall thickening. No small bowel dilatation. The terminal ileum is normal. The appendix is normal. No gross colonic mass. No colonic wall thickening. Vascular/Lymphatic: Moderate atherosclerotic calcification is noted in the wall of the aorta. There is no gastrohepatic or hepatoduodenal ligament lymphadenopathy. No retroperitoneal or mesenteric lymphadenopathy. No pelvic sidewall lymphadenopathy. Reproductive: Unremarkable. Other: No intraperitoneal free fluid. Musculoskeletal: No worrisome lytic or sclerotic osseous abnormality. IMPRESSION: 1. No definite evidence  for metastatic disease in the abdomen or pelvis. 2. 9 mm hypodensity in the left liver is too small to characterize but statistically is likely benign. Attention on follow-up recommended. 3. Bibasilar collapse/consolidation with bilateral pleural effusions, right greater than left. Pleural effusions are progressive since the chest CT 4 days ago. 4.  Aortic Atherosclerosis (ICD10-I70.0). Electronically  Signed   By: Kennith Center M.D.   On: 02/27/2023 11:47   [place] CORE BIOPSY (SOFT TISSUE)  Result Date: 02/26/2023 INDICATION: Cervical and supraclavicular adenopathy.  No diagnosis. EXAM: ULTRASOUND-GUIDED RIGHT SUPRACLAVICULAR NODAL MASS BIOPSY COMPARISON:  CT neck and chest, 09/24/2022. MEDICATIONS: None ANESTHESIA/SEDATION: Local anesthetic was administered. COMPLICATIONS: None immediate. TECHNIQUE: Informed written consent was obtained from the patient and/or patient's representative after a discussion of the risks, benefits and alternatives to treatment. Questions regarding the procedure were encouraged and answered. Initial ultrasound scanning demonstrated RIGHT supraclavicular nodal mass. An ultrasound image was saved for documentation purposes. The procedure was planned. A timeout was performed prior to the initiation of the procedure. The operative was prepped and draped in the usual sterile fashion, and a sterile drape was applied covering the operative field. A timeout was performed prior to the initiation of the procedure. Local anesthesia was provided with 1% lidocaine with epinephrine. Under direct ultrasound guidance, an 18 gauge core needle device was utilized to obtain to obtain 3 core needle biopsies of the RIGHT supraclavicular nodal mass. The samples were placed in saline and submitted to pathology. The needle was removed and superficial hemostasis was achieved with manual compression. Post procedure scan was negative for significant hematoma. A dressing was applied. The patient tolerated the procedure well without immediate postprocedural complication. IMPRESSION: Successful core biopsy of RIGHT supraclavicular nodal mass. Roanna Banning, MD [facility] Electronically Signed   By: Roanna Banning M.D.   On: 02/26/2023 10:15   ECHOCARDIOGRAM COMPLETE  Result Date: 02/24/2023    ECHOCARDIOGRAM REPORT   Patient Name:   TYRONICA TRUXILLO Date  of Exam: 02/24/2023 Medical Rec #:  [id number]         Height:       68.0 in Accession #:    [id number]        Weight:       157.0 lb Date of Birth:  14-Jul-1971        BSA:          1.844 m Patient Age:    51 years          BP:           111/73 mmHg Patient Gender: F                 HR:           88 bpm. Exam Location:  Inpatient Procedure: 2D Echo, Cardiac Doppler and Color Doppler Indications:    A-FIB  History:        Patient has no prior history of Echocardiogram examinations.                 Arrythmias:Atrial Fibrillation; Risk Factors:Current Smoker.  Sonographer:    Darlys Gales Referring Phys: [phone] JACOB J STINSON IMPRESSIONS  1. Left ventricular ejection fraction, by estimation, is 60 to 65%. The left ventricle has normal function. The left ventricle has no regional wall motion abnormalities. Left ventricular diastolic parameters are indeterminate.  2. Right ventricular systolic function is normal. The right ventricular size is normal. Tricuspid regurgitation signal is inadequate for assessing  PA pressure.  3. The mitral valve is normal in structure. No evidence of mitral valve regurgitation.  4. The aortic valve is grossly normal. Aortic valve regurgitation is not visualized.  5. The inferior vena cava is dilated in size with <50% respiratory variability, suggesting right atrial pressure of 15 mmHg. FINDINGS  Left Ventricle: Left ventricular ejection fraction, by estimation, is 60 to 65%. The left ventricle has normal function. The left ventricle has no regional wall motion abnormalities. The left ventricular internal cavity size was normal in size. There is  no left ventricular hypertrophy. Left ventricular diastolic parameters are indeterminate. Right Ventricle: The right ventricular size is normal. Right ventricular systolic function is normal. Tricuspid regurgitation signal is inadequate for assessing PA pressure. Left Atrium: Left atrial size was normal in size. Right Atrium: Right atrial size was  normal in size. Pericardium: There is no evidence of pericardial effusion. Mitral Valve: The mitral valve is normal in structure. No evidence of mitral valve regurgitation. Tricuspid Valve: Tricuspid valve regurgitation is not demonstrated. Aortic Valve: The aortic valve is grossly normal. Aortic valve regurgitation is not visualized. Aortic valve mean gradient measures 5.0 mmHg. Aortic valve peak gradient measures 7.6 mmHg. Aortic valve area, by VTI measures 2.82 cm. Pulmonic Valve: Pulmonic valve regurgitation is not visualized. Aorta: The aortic root and ascending aorta are structurally normal, with no evidence of dilitation. Venous: The inferior vena cava is dilated in size with less than 50% respiratory variability, suggesting right atrial pressure of 15 mmHg. IAS/Shunts: The interatrial septum was not well visualized.  LEFT VENTRICLE PLAX 2D LVIDd:         5.10 cm   Diastology LVIDs:         3.00 cm   LV e' medial:    7.07 cm/s LV PW:         0.80 cm   LV E/e' medial:  12.3 LV IVS:        0.70 cm   LV e' lateral:   10.20 cm/s LVOT diam:     1.80 cm   LV E/e' lateral: 8.5 LV SV:         76 LV SV Index:   41 LVOT Area:     2.54 cm  RIGHT VENTRICLE             IVC RV S prime:     15.10 cm/s  IVC diam: 2.50 cm TAPSE (M-mode): 2.5 cm LEFT ATRIUM             Index        RIGHT ATRIUM           Index LA Vol (A2C):   21.7 ml 11.77 ml/m  RA Area:     14.00 cm LA Vol (A4C):   27.3 ml 14.81 ml/m  RA Volume:   34.60 ml  18.76 ml/m LA Biplane Vol: 24.7 ml 13.40 ml/m  AORTIC VALVE AV Area (Vmax):    2.42 cm AV Area (Vmean):   2.26 cm AV Area (VTI):     2.82 cm AV Vmax:           138.00 cm/s AV Vmean:          102.000 cm/s AV VTI:            0.269 m AV Peak Grad:      7.6 mmHg AV Mean Grad:      5.0 mmHg LVOT Vmax:         131.00 cm/s LVOT Vmean:  90.500 cm/s LVOT VTI:          0.298 m LVOT/AV VTI ratio: 1.11  AORTA Ao Root diam: 3.20 cm Ao Asc diam:  3.50 cm MITRAL VALVE MV Area (PHT): 5.23 cm     SHUNTS MV  Decel Time: 145 msec     Systemic VTI:  0.30 m MV E velocity: 87.00 cm/s   Systemic Diam: 1.80 cm MV A velocity: 102.00 cm/s MV E/A ratio:  0.85 Photographer signed by Carolan Clines Signature Date/Time: 02/24/2023/3:44:27 PM    Final    CT Angio Chest PE W and/or Wo Contrast  Result Date: 02/23/2023 CLINICAL DATA:  Shortness of breath, right neck mass, chest mass, headache * Tracking Code: BO * EXAM: CT ANGIOGRAPHY CHEST WITH CONTRAST TECHNIQUE: Multidetector CT imaging of the chest was performed using the standard protocol during bolus administration of intravenous contrast. Multiplanar CT image reconstructions and MIPs were obtained to evaluate the vascular anatomy. RADIATION DOSE REDUCTION: This exam was performed according to the departmental dose-optimization program which includes automated exposure control, adjustment of the mA and/or kV according to patient size and/or use of iterative reconstruction technique. CONTRAST:  75mL OMNIPAQUE IOHEXOL 350 MG/ML SOLN COMPARISON:  None Available. FINDINGS: Cardiovascular: No filling defect is identified in the pulmonary arterial tree to suggest pulmonary embolus. Mild atheromatous vascular calcification of the aortic arch. The large mediastinal mass bulges into the SVC posteriorly for example on image 128 series 4, probably from extrinsic compression, definite invasion is not observed. Because of lack of complete opacification from pulmonary arterial timing, patency of the left brachiocephalic vein is uncertain. Moderate pericardial effusion. Mediastinum/Nodes: Large right infrahilar and hilar mass extending into the mediastinum and tracking all the way to the thoracic inlet. This extends around the right mainstem bronchus, right upper lobe bronchus, and bronchus intermedius as well as the right lower lobe and right middle lobe bronchi, and likewise surrounds and narrows the right pulmonary artery and its branches. In the subcarinal region, the  conglomerate mass measures about 7.9 by 5.6 cm (image 144, series 4) and the mass flattens and narrows the SVC without overt occlusion. In the right infrahilar region, the observed mass measures 4.7 by 3.9 cm on image 179 series 4. Presumed lymph node anterior to the SVC in the upper chest measures 2.6 cm in short axis on image 36 series 3. A right lower neck level IV mass/lymph node measures 3.4 cm in short axis on image 10 series 3. Lungs/Pleura: The mass in the right chest displaces the upper trachea to the left. There is substantial narrowing of the right upper lobe bronchus, right middle lobe bronchus, and severe narrowing of the right lower lobe bronchus due to the surrounding mass. Patchy regions of nodularity in the right lower lobe distal to the mass may represent postobstructive pneumonitis or satellite tumor nodules. Centrilobular emphysema. Scarring or atelectasis in the right middle lobe and right upper lobe anteriorly. Mild dependent atelectasis in both lower lobes. Trace right pleural effusion, image 50 series 3. Upper Abdomen: Small nonspecific hypodense lesion in the left hepatic lobe 0.9 cm in long axis on image 96 series 3. Musculoskeletal: Unremarkable Review of the MIP images confirms the above findings. IMPRESSION: 1. Large right infrahilar and hilar mass extending into the mediastinum and tracking all the way to the thoracic inlet. This extends around the right mainstem bronchus, right upper lobe bronchus, and bronchus intermedius as well as the right lower lobe and right middle lobe bronchi, and  likewise surrounds and narrows the right pulmonary artery and its branches. The mass flattens and narrows the SVC without overt occlusion, and is confluent with bulky right level IV neck adenopathy. Top differential diagnostic considerations include lung cancer (such as squamous cell or small cell) versus extensive thoracic and right lower neck lymphoma. 2. Patchy regions of nodularity in the right  lower lobe distal to the mass may represent postobstructive pneumonitis or satellite tumor nodules. 3. Presumed lymph node anterior to the SVC in the upper chest measures 2.6 cm in short axis. 4. A right lower neck level IV mass/lymph node measures 3.4 cm in short axis, compatible with malignancy. 5. Moderate pericardial effusion. 6. Trace right pleural effusion. 7. Centrilobular emphysema. 8. Small nonspecific hypodense lesion in the left hepatic lobe 0.9 cm in long axis. 9. No pulmonary embolus is identified. 10. Aortic atherosclerosis. Aortic Atherosclerosis (ICD10-I70.0) and Emphysema (ICD10-J43.9). Electronically Signed   By: Gaylyn Rong M.D.   On: 02/23/2023 13:39   CT Soft Tissue Neck W Contrast  Result Date: 02/23/2023 CLINICAL DATA:  Neck mass, nonpulsatile right sided neck mass EXAM: CT NECK WITH CONTRAST TECHNIQUE: Multidetector CT imaging of the neck was performed using the standard protocol following the bolus administration of intravenous contrast. RADIATION DOSE REDUCTION: This exam was performed according to the departmental dose-optimization program which includes automated exposure control, adjustment of the mA and/or kV according to patient size and/or use of iterative reconstruction technique. CONTRAST:  75mL OMNIPAQUE IOHEXOL 350 MG/ML SOLN COMPARISON:  None Available. FINDINGS: Pharynx and larynx: Normal. No mass or swelling. Salivary glands: No inflammation, mass, or stone. Thyroid: Normal. Lymph nodes: There is bulky lower cervical and mediastinal lymphadenopathy involving the right level 3 and level 4 lymph node stations, the right supraclavicular region, and the anterior and posterior mediastinum. Cervical lymphadenopathy significant mass effect on the right internal jugular vein (series 5, image 68). Vascular: Negative. Mass effect on the right internal jugular vein, as above Limited intracranial: Negative. Visualized orbits: Negative. Mastoids and visualized paranasal sinuses:  Moderate left-sided mastoid effusion. There is pneumatization of the petrous apices with air-fluid levels. Paranasal sinuses are clear. Orbits are unremarkable. Skeleton: No acute or aggressive process. Upper chest: Mild paraseptal emphysema. See same day CT chest for additional findings Other: None. IMPRESSION: 1. Bulky right lower cervical and mediastinal lymphadenopathy, concerning for metastatic disease or lymphoma. 2. See separate CT chest for additional findings. Emphysema (ICD10-J43.9). Electronically Signed   By: Lorenza Cambridge M.D.   On: 02/23/2023 13:17   CT Head Wo Contrast  Result Date: 02/23/2023 CLINICAL DATA:  Headache EXAM: CT HEAD WITHOUT CONTRAST TECHNIQUE: Contiguous axial images were obtained from the base of the skull through the vertex without intravenous contrast. RADIATION DOSE REDUCTION: This exam was performed according to the departmental dose-optimization program which includes automated exposure control, adjustment of the mA and/or kV according to patient size and/or use of iterative reconstruction technique. COMPARISON:  None Available. FINDINGS: Brain: There is no acute intracranial hemorrhage, extra-axial fluid collection, or acute infarct Parenchymal volume is normal. The ventricles are normal in size. Gray-white differentiation is preserved The pituitary and suprasellar region are normal. There is no mass lesion. There is no mass effect or midline shift. Vascular: No hyperdense vessel or unexpected calcification. Skull: Normal. Negative for fracture or focal lesion. Sinuses/Orbits: The imaged paranasal sinuses are clear. The globes and orbits are unremarkable. Other: There are small bilateral mastoid effusions. IMPRESSION: 1. No acute intracranial pathology. 2. Small bilateral mastoid effusions. Electronically  Signed   By: Lesia Hausen M.D.   On: 02/23/2023 13:15   DG Chest Port 1 View  Result Date: 02/23/2023 CLINICAL DATA:  Sepsis EXAM: PORTABLE CHEST 1 VIEW COMPARISON:   X-ray 01/09/2023 FINDINGS: No pneumothorax, effusion or edema. Calcified aorta. Overlapping cardiac leads. There is new widening of the mediastinum compared to prior x-ray and fullness of the right lung hilum. Recommend further evaluation with CT to delineate for potential mass. IMPRESSION: New widening of the mediastinum with masslike fullness in the right lung hilum. Recommend follow up contrast CT to further delineate Electronically Signed   By: Karen Kays M.D.   On: 02/23/2023 13:11

## 2023-03-04 NOTE — Progress Notes (Signed)
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  Prior to hospitalization there was suspicion for pneumonia which was treated by PCP with antibiotics.  Upon admission CT of the chest showed right infrahilar and hilar mass extending around right main stem bronchus, right upper lobe into thoracic inlet with bulky adenopathy.  EKG also revealed atrial fibrillation with RVR requiring IV Cardizem.  Patient was admitted to [facility] for multispecialty evaluation.  Cardiology, PCCM and oncology was consulted.  IR performed cervical node biopsy on 6/17 which eventually confirmed small cell carcinoma and due to concerns of SVC syndrome patient was transferred to [facility] for further management.  Patient also underwent Port-A-Cath placement with bone marrow biopsy on 6/22.   Assessment & Plan:  Principal Problem:   Lung mass Active Problems:   Atrial fibrillation with RVR (HCC)   Hypokalemia   Acute respiratory failure with hypoxia (HCC)   Thrombocytopenia (HCC)   COPD with acute exacerbation (HCC)   Lactic acidosis   Small cell carcinoma of lung, right (HCC)     Small cell carcinoma of right lung with extension of right infrahilar, right mainstem bronchus, right upper lobe and thoracic inlet adenopathy Tobacco use - Upon admission patient was seen by PCCM and oncology.  CT abdomen pelvis and MRI brain did not show any metastatic disease.  Eventually underwent Port-A-Cath placement and bone marrow biopsy at [facility] on 6/21 and thereafter transferred to [facility] and chemotherapy has been started per oncology team which will be completed during this  hospitalization.  Also started radiation on 6/21    Acute respiratory failure with hypoxia COPD -Secondary to underlying pulmonary mass and likely has some extent of COPD.  Initially thoracentesis was attempted but not enough fluid was removed.  Currently will place her on supplemental oxygen as needed.  Bronchodilators, I-S/flutter valve. CT and chest x-ray reviewed.  Will arrange for home oxygen    Gram-positive bacteremia: -Most likely contaminated.  No obvious evidence of infection at this time    A-fib with RVR: Likely paroxysmal -Seen by cardiology.  Initially treated with Cardizem and amiodarone drip.  Did not recommend anticoagulation - Now transition to amiodarone 200 mg p.o. daily with plans to follow-up outpatient with Dr. Sharyn Lull   Lactic acidosis -Resolved   Thrombocytopenia:  Suspecting from underlying malignancy   Hypokalemia Replete as needed    DVT prophylaxis: SCDs Start: 02/23/23 2132 Code Status: Full code Family Communication:   Status is: Inpatient Continue hospital stay until cleared by oncology.  Hopefully we can discharge her tomorrow       Diet Orders (From admission, onward)     Start     Ordered   03/02/23 1041  Diet regular Room service appropriate? Yes; Fluid consistency: Thin  Diet effective now       Question Answer Comment  Room service appropriate? Yes   Fluid consistency: Thin      03/02/23 1040            Subjective: Overall feels weak after getting chemotherapy.  Still has some exertional dyspnea  Examination:  Constitutional: Not in acute distress; 3L [place] Respiratory: b/l rhonchi Cardiovascular: Normal sinus rhythm, no rubs Abdomen: Nontender nondistended good bowel  sounds Musculoskeletal: No edema noted Skin: No rashes seen Neurologic: CN 2-12 grossly intact.  And nonfocal Psychiatric: Normal judgment and insight. Alert and oriented x 3. Normal mood.   Right chest wall port in place  Objective: Vitals:    03/03/23 2202 03/04/23 0034 03/04/23 0853 03/04/23 1325  BP: 118/80 122/67  120/67  Pulse: 96 91  (!) 105  Resp: 14 16    Temp: 98.4 F (36.9 C) 97.9 F (36.6 C)  99.2 F (37.3 C)  TempSrc: Oral Oral  Oral  SpO2: 92% 92% 94% 91%  Weight:      Height:        Intake/Output Summary (Last 24 hours) at 03/04/2023 1335 Last data filed at 03/04/2023 0539 Gross per 24 hour  Intake 120 ml  Output 1600 ml  Net -1480 ml   Filed Weights   02/23/23 1108  Weight: 71.2 kg    Scheduled Meds:  allopurinol  300 mg Oral BID   amiodarone  200 mg Oral Daily   budesonide (PULMICORT) nebulizer solution  0.5 mg Nebulization BID   Chlorhexidine Gluconate Cloth  6 each Topical Daily   dicyclomine  20 mg Oral Daily   fluticasone furoate-vilanterol  1 puff Inhalation Daily   nicotine  21 mg Transdermal Daily   polyethylene glycol  17 g Oral Daily   senna-docusate  1 tablet Oral BID   Continuous Infusions:  sodium chloride Stopped (03/03/23 2058)    Nutritional status     Body mass index is 23.87 kg/m.  Data Reviewed:   CBC: Recent Labs  Lab 02/28/23 0639 03/01/23 1050 03/02/23 0930 03/03/23 0508 03/04/23 0500  WBC 7.0 5.4 7.5 8.2 9.4  HGB 11.7* 11.6* 11.6* 10.8* 9.6*  HCT 33.5* 34.8* 34.9* 32.6* 29.8*  MCV 94.9 94.8 94.6 97.0 98.7  PLT 51* 47* 48* 50* 42*   Basic Metabolic Panel: Recent Labs  Lab 02/28/23 0547 03/01/23 1050 03/02/23 1118 03/03/23 0508 03/04/23 0500  NA 137 134* 134* 132* 133*  K 3.2* 3.4* 3.5 4.3 3.9  CL 101 100 100 104 102  CO2 21* 23 23 21* 20*  GLUCOSE 112* 107* 91 156* 139*  BUN 16 19 17 20 16   CREATININE 1.04* 0.96 0.96 0.79 0.90  CALCIUM 8.9 9.3 9.5 9.1 8.8*  MG  --   --   --   --  1.9   GFR: Estimated Creatinine Clearance: 73.8 mL/min (by C-G formula based on SCr of 0.9 mg/dL). Liver Function Tests: Recent Labs  Lab 03/02/23 1118 03/04/23 0500  AST 30 33  ALT 15 20  ALKPHOS 106 81  BILITOT 0.6 0.5  PROT 5.9* 5.4*  ALBUMIN 2.9*  2.6*   No results for input(s): "LIPASE", "AMYLASE" in the last 168 hours. No results for input(s): "AMMONIA" in the last 168 hours. Coagulation Profile: No results for input(s): "INR", "PROTIME" in the last 168 hours.  Cardiac Enzymes: No results for input(s): "CKTOTAL", "CKMB", "CKMBINDEX", "TROPONINI" in the last 168 hours. BNP (last 3 results) No results for input(s): "PROBNP" in the last 8760 hours. HbA1C: No results for input(s): "HGBA1C" in the last 72 hours. CBG: No results for input(s): "GLUCAP" in the last 168 hours. Lipid Profile: No results for input(s): "CHOL", "HDL", "LDLCALC", "TRIG", "CHOLHDL", "LDLDIRECT" in the last 72 hours. Thyroid Function Tests: No results for input(s): "TSH", "T4TOTAL", "FREET4", "T3FREE", "THYROIDAB" in the last 72 hours. Anemia Panel: No results for input(s): "VITAMINB12", "FOLATE", "FERRITIN", "TIBC", "IRON", "RETICCTPCT" in the last 72 hours. Sepsis  Labs: Recent Labs  Lab 02/26/23 0151 02/27/23 0604 02/27/23 1252 02/28/23 0547 03/01/23 1050  PROCALCITON  --   --  <0.10  --   --   LATICACIDVEN 2.7* 3.0*  --  2.3* 1.9    Recent Results (from the past 240 hour(s))  Culture, blood (Routine x 2)     Status: None   Collection Time: 02/23/23 11:28 AM   Specimen: BLOOD  Result Value Ref Range Status   Specimen Description BLOOD BLOOD RIGHT ARM  Final   Special Requests   Final    BOTTLES DRAWN AEROBIC AND ANAEROBIC Blood Culture adequate volume   Culture   Final    NO GROWTH 5 DAYS Performed at [facility], [address] [place], [place] [address]    Report Status 02/28/2023 FINAL  Final  Culture, blood (Routine x 2)     Status: None   Collection Time: 02/23/23 11:29 AM   Specimen: Left Antecubital; Blood  Result Value Ref Range Status   Specimen Description   Final    LEFT ANTECUBITAL BLOOD Performed at [facility], [address], [place], [place] [address]    Special Requests   Final    BOTTLES DRAWN AEROBIC AND  ANAEROBIC Blood Culture adequate volume Performed at [facility], [address], [place], [place] [address]    Culture  Setup Time   Final    AEROBIC BOTTLE ONLY GRAM POSITIVE COCCI Gram Stain Report Called to,Read Back By and Verified With: WEST,MIKE ([place]) @ 1105 ON 02/27/2023 BY FRATTO,ASHLEY CRITICAL RESULT CALLED TO, READ BACK BY AND VERIFIED WITH: PHARMD L. CHEN [id number] @ 1840 FH    Culture   Final    GRAM POSITIVE COCCI DERMACOCCUS NISHINOMIYAENSIS Standardized susceptibility testing for this organism is not available. Performed at [facility], [address], [place], [place] [address]    Report Status 03/02/2023 FINAL  Final  Blood Culture ID Panel (Reflexed)     Status: None   Collection Time: 02/23/23 11:29 AM  Result Value Ref Range Status   Enterococcus faecalis NOT DETECTED NOT DETECTED Final   Enterococcus Faecium NOT DETECTED NOT DETECTED Final   Listeria monocytogenes NOT DETECTED NOT DETECTED Final   Staphylococcus species NOT DETECTED NOT DETECTED Final   Staphylococcus aureus (BCID) NOT DETECTED NOT DETECTED Final   Staphylococcus epidermidis NOT DETECTED NOT DETECTED Final   Staphylococcus lugdunensis NOT DETECTED NOT DETECTED Final   Streptococcus species NOT DETECTED NOT DETECTED Final   Streptococcus agalactiae NOT DETECTED NOT DETECTED Final   Streptococcus pneumoniae NOT DETECTED NOT DETECTED Final   Streptococcus pyogenes NOT DETECTED NOT DETECTED Final   A.calcoaceticus-baumannii NOT DETECTED NOT DETECTED Final   Bacteroides fragilis NOT DETECTED NOT DETECTED Final   Enterobacterales NOT DETECTED NOT DETECTED Final   Enterobacter cloacae complex NOT DETECTED NOT DETECTED Final   Escherichia coli NOT DETECTED NOT DETECTED Final   Klebsiella aerogenes NOT DETECTED NOT DETECTED Final   Klebsiella oxytoca NOT DETECTED NOT DETECTED Final   Klebsiella pneumoniae NOT DETECTED NOT DETECTED Final   Proteus species NOT DETECTED NOT DETECTED Final    Salmonella species NOT DETECTED NOT DETECTED Final   Serratia marcescens NOT DETECTED NOT DETECTED Final   Haemophilus influenzae NOT DETECTED NOT DETECTED Final   Neisseria meningitidis NOT DETECTED NOT DETECTED Final   Pseudomonas aeruginosa NOT DETECTED NOT DETECTED Final   Stenotrophomonas maltophilia NOT DETECTED NOT DETECTED Final   Candida albicans NOT DETECTED NOT DETECTED Final  Candida auris NOT DETECTED NOT DETECTED Final   Candida glabrata NOT DETECTED NOT DETECTED Final   Candida krusei NOT DETECTED NOT DETECTED Final   Candida parapsilosis NOT DETECTED NOT DETECTED Final   Candida tropicalis NOT DETECTED NOT DETECTED Final   Cryptococcus neoformans/gattii NOT DETECTED NOT DETECTED Final    Comment: Performed at [facility], [address], [place], [place] [address]  Culture, blood (Routine X 2) w Reflex to ID Panel     Status: None   Collection Time: 02/27/23 12:52 PM   Specimen: BLOOD RIGHT HAND  Result Value Ref Range Status   Specimen Description BLOOD RIGHT HAND  Final   Special Requests   Final    BOTTLES DRAWN AEROBIC AND ANAEROBIC Blood Culture adequate volume   Culture   Final    NO GROWTH 5 DAYS Performed at [facility], [address], [place], [place] [address]    Report Status 03/04/2023 FINAL  Final  Culture, blood (Routine X 2) w Reflex to ID Panel     Status: None   Collection Time: 02/27/23 12:53 PM   Specimen: BLOOD  Result Value Ref Range Status   Specimen Description BLOOD SITE NOT SPECIFIED  Final   Special Requests   Final    BOTTLES DRAWN AEROBIC AND ANAEROBIC Blood Culture adequate volume   Culture   Final    NO GROWTH 5 DAYS Performed at [facility], [address] [place], [place] [address]    Report Status 03/04/2023 FINAL  Final         Radiology Studies: No results found.         LOS: 9 days   Time spent= 35 mins    Mitsy Owen Joline Maxcy, MD [facility]  If 7PM-7AM, please contact  night-coverage  03/04/2023, 1:35 PM

## 2023-03-04 NOTE — Progress Notes (Signed)
SATURATION QUALIFICATIONS: (This note is used to comply with regulatory documentation for home oxygen)  Patient Saturations on Room Air at Rest = 92%  Patient Saturations on Room Air while Ambulating = 86-84%  Patient Saturations on 2 Liters of oxygen while Ambulating = 88%  Please briefly explain why pat92ient needs home oxygen: When ambulating patient O2 saturation drops to 84@ within 88ft, recovery time is 1-2 minutes with heavy breathing.

## 2023-03-05 ENCOUNTER — Ambulatory Visit: Payer: No Typology Code available for payment source

## 2023-03-05 ENCOUNTER — Inpatient Hospital Stay: Payer: No Typology Code available for payment source

## 2023-03-05 ENCOUNTER — Ambulatory Visit: Payer: No Typology Code available for payment source | Admitting: Hematology

## 2023-03-05 ENCOUNTER — Ambulatory Visit: Payer: Medicaid Other

## 2023-03-05 ENCOUNTER — Encounter: Payer: No Typology Code available for payment source | Admitting: Dietician

## 2023-03-05 ENCOUNTER — Other Ambulatory Visit: Payer: No Typology Code available for payment source

## 2023-03-05 ENCOUNTER — Other Ambulatory Visit: Payer: Self-pay

## 2023-03-05 DIAGNOSIS — R918 Other nonspecific abnormal finding of lung field: Secondary | ICD-10-CM | POA: Diagnosis not present

## 2023-03-05 LAB — RAD ONC ARIA SESSION SUMMARY
Course Elapsed Days: 3
Plan Fractions Treated to Date: 1
Plan Prescribed Dose Per Fraction: 2.5 Gy
Plan Total Fractions Prescribed: 13
Plan Total Prescribed Dose: 32.5 Gy
Reference Point Dosage Given to Date: 2.5 Gy
Reference Point Session Dosage Given: 2.5 Gy
Session Number: 2

## 2023-03-05 LAB — COMPREHENSIVE METABOLIC PANEL
ALT: 26 U/L (ref 0–44)
AST: 39 U/L (ref 15–41)
Albumin: 2.8 g/dL — ABNORMAL LOW (ref 3.5–5.0)
Alkaline Phosphatase: 86 U/L (ref 38–126)
Anion gap: 8 (ref 5–15)
BUN: 15 mg/dL (ref 6–20)
CO2: 24 mmol/L (ref 22–32)
Calcium: 9.4 mg/dL (ref 8.9–10.3)
Chloride: 101 mmol/L (ref 98–111)
Creatinine, Ser: 0.83 mg/dL (ref 0.44–1.00)
GFR, Estimated: >60 mL/min (ref >60)
Glucose, Bld: 113 mg/dL — ABNORMAL HIGH (ref 70–99)
Potassium: 3.6 mmol/L (ref 3.5–5.1)
Sodium: 133 mmol/L — ABNORMAL LOW (ref 135–145)
Total Bilirubin: 0.5 mg/dL (ref 0.3–1.2)
Total Protein: 5.9 g/dL — ABNORMAL LOW (ref 6.5–8.1)

## 2023-03-05 LAB — CBC
HCT: 30.6 % — ABNORMAL LOW (ref 36.0–46.0)
Hemoglobin: 10.1 g/dL — ABNORMAL LOW (ref 12.0–15.0)
MCH: 32.2 pg (ref 26.0–34.0)
MCHC: 33 g/dL (ref 30.0–36.0)
MCV: 97.5 fL (ref 80.0–100.0)
Platelets: 33 10*3/uL — ABNORMAL LOW (ref 150–400)
RBC: 3.14 MIL/uL — ABNORMAL LOW (ref 3.87–5.11)
RDW: 14.8 % (ref 11.5–15.5)
WBC: 6.8 10*3/uL (ref 4.0–10.5)
nRBC: 0 % (ref 0.0–0.2)

## 2023-03-05 LAB — URIC ACID: Uric Acid, Serum: 2.1 mg/dL — ABNORMAL LOW (ref 2.5–7.1)

## 2023-03-05 LAB — MAGNESIUM: Magnesium: 2 mg/dL (ref 1.7–2.4)

## 2023-03-05 LAB — LACTATE DEHYDROGENASE: LDH: 564 U/L — ABNORMAL HIGH (ref 98–192)

## 2023-03-05 MED ORDER — HYDROCODONE-ACETAMINOPHEN 5-325 MG PO TABS: 1.0000 | ORAL_TABLET | Freq: Four times a day (QID) | ORAL | 0 refills | Status: DC | PRN

## 2023-03-05 MED ORDER — POLYETHYLENE GLYCOL 3350 17 G PO PACK: 17.0000 g | PACK | Freq: Every day | ORAL | 0 refills | Status: DC

## 2023-03-05 MED ORDER — HEPARIN SOD (PORK) LOCK FLUSH 100 UNIT/ML IV SOLN
500.0000 [IU] | INTRAVENOUS | Status: AC | PRN
  Administered 2023-03-05: 500 [IU]
  Filled 2023-03-05: qty 5

## 2023-03-05 MED ORDER — AMIODARONE HCL 200 MG PO TABS: 200.0000 mg | ORAL_TABLET | Freq: Every day | ORAL | 0 refills | Status: DC

## 2023-03-05 MED ORDER — SENNOSIDES-DOCUSATE SODIUM 8.6-50 MG PO TABS: 1.0000 | ORAL_TABLET | Freq: Every evening | ORAL | 0 refills | Status: DC | PRN

## 2023-03-05 MED ORDER — NICOTINE 21 MG/24HR TD PT24: 21.0000 mg | MEDICATED_PATCH | Freq: Every day | TRANSDERMAL | 0 refills | Status: DC

## 2023-03-05 MED ORDER — VENTOLIN HFA 108 (90 BASE) MCG/ACT IN AERS: 2.0000 | INHALATION_SPRAY | Freq: Four times a day (QID) | RESPIRATORY_TRACT | 1 refills | Status: DC | PRN

## 2023-03-05 MED ORDER — IPRATROPIUM-ALBUTEROL 0.5-2.5 (3) MG/3ML IN SOLN: 3.0000 mL | RESPIRATORY_TRACT | 1 refills | Status: DC | PRN

## 2023-03-05 NOTE — TOC Transition Note (Signed)
Transition of Care [facility]) - CM/SW Discharge Note   Patient Details  Name: Ana Curry MRN: [id number] Date of Birth: 04-09-1971  [facility] CM/SW Contact:  Armanda Heritage, RN Phone Number: 03/05/2023, 11:13 AM   Clinical Narrative:    Patient anticipated to discharge home today.  DME referral made to [employer] for home oxygen, neb machine and bedside commode, rep Jermaine accepts referral and will deliver to bedside.  No further TOC needs identified at this time.   Final next level of care: Home/Self Care Barriers to Discharge: No Barriers Identified   Patient Goals and CMS Choice CMS Medicare.gov Compare Post Acute Care list provided to:: Patient Choice offered to / list presented to : Patient  Discharge Placement                         Discharge Plan and Services Additional resources added to the After Visit Summary for     Discharge Planning Services: CM Consult Post Acute Care Choice: Durable Medical Equipment          DME Arranged: Bedside commode, Oxygen, Nebulizer machine DME Agency: Other - Comment Loyal Buba) Date DME Agency Contacted: 03/05/23 Time DME Agency Contacted: 1113 Representative spoke with at DME Agency: Vaughan Basta HH Arranged: NA HH Agency: NA        Social Determinants of Health (SDOH) Interventions SDOH Screenings   Food Insecurity: No Food Insecurity (03/04/2023)  Housing: Low Risk  (03/04/2023)  Transportation Needs: No Transportation Needs (03/04/2023)  Utilities: Not At Risk (03/04/2023)  Tobacco Use: High Risk (03/02/2023)     Readmission Risk Interventions     No data to display

## 2023-03-05 NOTE — Discharge Summary (Signed)
Physician Discharge Summary  Jazmine Heckman Mirarchi XBJ:[id number] DOB: 03/12/1971 DOA: 02/23/2023  PCP: [facility]  Admit date: 02/23/2023 Discharge date: 03/05/2023  Admitted From: Home Disposition:  Home  Recommendations for Outpatient Follow-up:  Follow up with PCP in 1-2 weeks Please obtain BMP/CBC in one week your next doctors visit.  Pain meds with bowel regimen On daily Amio but no AC per Cardiology. Needs to follow up with Dr Sharyn Lull.   Discharge Condition: Stable CODE STATUS: Full Diet recommendation: Regular.   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  Prior to hospitalization there was suspicion for pneumonia which was treated by PCP with antibiotics.  Upon admission CT of the chest showed right infrahilar and hilar mass extending around right main stem bronchus, right upper lobe into thoracic inlet with bulky adenopathy.  EKG also revealed atrial fibrillation with RVR requiring IV Cardizem.  Patient was admitted to [facility] for multispecialty evaluation.  Cardiology, PCCM and oncology was consulted.  IR performed cervical node biopsy on 6/17 which eventually confirmed small cell carcinoma and due to concerns of SVC syndrome patient was transferred to [facility] for further management.  Patient also underwent Port-A-Cath placement with bone marrow biopsy on 6/22. During the hospitalization patient was seen by oncology where she received chemotherapy, also underwent radiation on 6/21 and 6/24.  Today medically stable for discharge.       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  CT  abdomen pelvis and MRI brain did not show any metastatic disease.  Eventually underwent Port-A-Cath placement and bone marrow biopsy at [facility] on 6/21 and thereafter transferred to [facility] and chemotherapy has been started per oncology team which will be completed during this hospitalization.  Also started radiation on 6/21 and 2nd radiation on 6/24 Stable for dc today. Has outpatient Onc appt tomorrow.     Acute respiratory failure with hypoxia COPD -Secondary to underlying pulmonary mass and likely has some extent of COPD.  Initially thoracentesis was attempted but not enough fluid was removed.  Currently will place her on supplemental oxygen as needed.  Bronchodilators, I-S/flutter valve. CT and chest x-ray reviewed.  Will arrange for home oxygen prior to dc    Gram-positive bacteremia: -Most likely contaminated.  No obvious evidence of infection at this time    A-fib with RVR: Likely paroxysmal -Seen by cardiology.  Initially treated with Cardizem and amiodarone drip.  Did not recommend anticoagulation - Now transition to amiodarone 200 mg p.o. daily with plans to follow-up outpatient with Dr. Sharyn Lull   Lactic acidosis -Resolved   Thrombocytopenia:  Suspecting from underlying malignancy   Hypokalemia Replete as needed  Discharge Diagnoses:  Principal Problem:   Lung mass Active Problems:   Atrial fibrillation with RVR (HCC)   Hypokalemia   Acute respiratory failure with hypoxia (HCC)   Thrombocytopenia (HCC)   COPD with acute exacerbation (HCC)   Lactic acidosis   Small cell carcinoma of lung, right (HCC)      Consultations: Caridology IR Onc  Subjective: Feels ok no new complaints Family at bedside.   Discharge Exam: Vitals:   03/05/23 0750 03/05/23 1327  BP:  114/75  Pulse:  (!) 109  Resp:    Temp:  98 F (36.7 C)  SpO2: 93% 91%   Vitals:   03/05/23 0420 03/05/23 0453 03/05/23 0750 03/05/23 1327  BP:    114/75  Pulse:     (!) 109  Resp:      Temp:    98 F (36.7 C)  TempSrc:    Oral  SpO2: 92% 94% 93% 91%  Weight:      Height:        General: Pt is alert, awake, not in acute distress Cardiovascular: RRR, S1/S2 +, no rubs, no gallops Respiratory: CTA bilaterally, no wheezing, no rhonchi Abdominal: Soft, NT, ND, bowel sounds + Extremities: no edema, no cyanosis  Discharge Instructions  Discharge Instructions     Infusion Appointment Request   Complete by: Mar 04, 2023    Contact your oncology clinic or infusion center to schedule this appointment.   IS Injection Appt Request (15 minutes)   Complete by: Mar 06, 2023    Contact your oncology clinic or infusion center to schedule this appointment.   Clinic Appointment Request   Complete by: Mar 23, 2023    Contact your oncology clinic or infusion center to schedule this appointment.   Infusion Appointment Request   Complete by: Mar 23, 2023    Contact your oncology clinic or infusion center to schedule this appointment.   Lab Appointment Request   Complete by: Mar 23, 2023    Contact your oncology clinic or infusion center to schedule this appointment.   Infusion Appointment Request   Complete by: Mar 24, 2023    Contact your oncology clinic or infusion center to schedule this appointment.   Infusion Appointment Request   Complete by: Mar 25, 2023    Contact your oncology clinic or infusion center to schedule this appointment.   IS Injection Appt Request (15 minutes)   Complete by: Mar 27, 2023    Contact your oncology clinic or infusion center to schedule this appointment.   Clinic Appointment Request   Complete by: Apr 13, 2023    Contact your oncology clinic or infusion center to schedule this appointment.   Infusion Appointment Request   Complete by: Apr 13, 2023    Contact your oncology clinic or infusion center to schedule this appointment.   Lab Appointment Request   Complete by: Apr 13, 2023    Contact your oncology clinic or  infusion center to schedule this appointment.   Infusion Appointment Request   Complete by: Apr 14, 2023    Contact your oncology clinic or infusion center to schedule this appointment.   Infusion Appointment Request   Complete by: Apr 15, 2023    Contact your oncology clinic or infusion center to schedule this appointment.   IS Injection Appt Request (15 minutes)   Complete by: Apr 17, 2023    Contact your oncology clinic or infusion center to schedule this appointment.      Allergies as of 03/05/2023       Reactions   Codeine Nausea And Vomiting        Medication List     STOP taking these medications    cefPROZIL 250 MG tablet Commonly known as: CEFZIL   predniSONE 20 MG tablet Commonly known as: DELTASONE       TAKE these medications    acetaminophen 500 MG tablet Commonly known as: TYLENOL Take 1,000 mg by mouth every 6 (six) hours as needed.   amiodarone  200 MG tablet Commonly known as: PACERONE Take 1 tablet (200 mg total) by mouth daily. Start taking on: March 06, 2023   clotrimazole-betamethasone cream Commonly known as: LOTRISONE Apply 1 Application topically daily. To feet.   dextromethorphan-guaiFENesin 30-600 MG 12hr tablet Commonly known as: MUCINEX DM Take 1 tablet by mouth 2 (two) times daily as needed for cough.   diazepam 10 MG tablet Commonly known as: VALIUM Take 10 mg by mouth daily as needed for anxiety.   dicyclomine 20 MG tablet Commonly known as: BENTYL Take 20 mg by mouth daily.   HYDROcodone-acetaminophen 5-325 MG tablet Commonly known as: NORCO/VICODIN Take 1 tablet by mouth every 6 (six) hours as needed for moderate pain or severe pain. What changed: when to take this   ipratropium-albuterol 0.5-2.5 (3) MG/3ML Soln Commonly known as: DUONEB Take 3 mLs by nebulization every 4 (four) hours as needed.   nicotine 21 mg/24hr patch Commonly known as: NICODERM CQ - dosed in mg/24 hours Place 1 patch (21 mg total) onto the  skin daily. Start taking on: March 06, 2023   polyethylene glycol 17 g packet Commonly known as: MIRALAX / GLYCOLAX Take 17 g by mouth daily. Start taking on: March 06, 2023   senna-docusate 8.6-50 MG tablet Commonly known as: Senokot-S Take 1 tablet by mouth at bedtime as needed for mild constipation.   triamcinolone ointment 0.1 % Commonly known as: KENALOG Apply 1 Application topically daily as needed (Ear rash).   Ventolin HFA 108 (90 Base) MCG/ACT inhaler Generic drug: albuterol Inhale 2 puffs into the lungs every 6 (six) hours as needed for wheezing or shortness of breath. What changed: when to take this               Durable Medical Equipment  (From admission, onward)           Start     Ordered   03/05/23 1103  For home use only DME Nebulizer/meds  Once       Question Answer Comment  Patient needs a nebulizer to treat with the following condition Acute respiratory distress   Length of Need Lifetime      03/05/23 1103   03/05/23 1103  For home use only DME Nebulizer machine  Once       Question Answer Comment  Patient needs a nebulizer to treat with the following condition Acute respiratory distress   Length of Need Lifetime      03/05/23 1103   03/05/23 1103  For home use only DME oxygen  Once       Comments: Dx: Small cell lung cancer  Question Answer Comment  Length of Need Lifetime   Mode or (Route) Nasal cannula   Liters per Minute 3   Oxygen delivery system Gas      03/05/23 1103   03/04/23 0825  For home use only DME oxygen  Once       Comments: Diagnosis-extensive small cell lung carcinoma  Question Answer Comment  Length of Need Lifetime   Mode or (Route) Nasal cannula   Liters per Minute 2   Frequency Continuous (stationary and portable oxygen unit needed)   Oxygen delivery system Gas      03/04/23 0825   02/28/23 1609  For home use only DME Bedside commode  Once       Question:  Patient needs a bedside commode to treat with the  following condition  Answer:  Physical deconditioning   02/28/23 1609   02/28/23  1605  For home use only DME oxygen  Once       Question Answer Comment  Length of Need Lifetime   Mode or (Route) Nasal cannula   Liters per Minute 3   Frequency Continuous (stationary and portable oxygen unit needed)   Oxygen delivery system Gas      02/28/23 1605            Follow-up Information     [facility] [facility] [employer] Follow up.   Why: Home 02 and Bedside commode arranged- will deliver portable 02 to bedside for transport home prior to discharge Contact information: [address] [place] [address] [phone]         [facility] Follow up.   Contact information: [address] [place] [address] [phone]                Allergies  Allergen Reactions   Codeine Nausea And Vomiting    You were cared for by a hospitalist during your hospital stay. If you have any questions about your discharge medications or the care you received while you were in the hospital after you are discharged, you can call the unit and asked to speak with the hospitalist on call if the hospitalist that took care of you is not available. Once you are discharged, your primary care physician will handle any further medical issues. Please note that no refills for any discharge medications will be authorized once you are discharged, as it is imperative that you return to your primary care physician (or establish a relationship with a primary care physician if you do not have one) for your aftercare needs so that they can reassess your need for medications and monitor your lab values.  You were cared for by a hospitalist during your hospital stay. If you have any questions about your discharge medications or the care you received while you were in the hospital after you are discharged, you can call the unit and asked to speak with the hospitalist on call if the hospitalist that took care of  you is not available. Once you are discharged, your primary care physician will handle any further medical issues. Please note that NO REFILLS for any discharge medications will be authorized once you are discharged, as it is imperative that you return to your primary care physician (or establish a relationship with a primary care physician if you do not have one) for your aftercare needs so that they can reassess your need for medications and monitor your lab values.  Please request your Prim.MD to go over all Hospital Tests and Procedure/Radiological results at the follow up, please get all Hospital records sent to your Prim MD by signing hospital release before you go home.  Get CBC, CMP, 2 view Chest X ray checked  by Primary MD during your next visit or SNF MD in 5-7 days ( we routinely change or add medications that can affect your baseline labs and fluid status, therefore we recommend that you get the mentioned basic workup next visit with your PCP, your PCP may decide not to get them or add new tests based on their clinical decision)  On your next visit with your primary care physician please Get Medicines reviewed and adjusted.  If you experience worsening of your admission symptoms, develop shortness of breath, life threatening emergency, suicidal or homicidal thoughts you must seek medical attention immediately by calling 911 or calling your MD immediately  if symptoms less severe.  You Must read complete instructions/literature along with all the possible adverse reactions/side effects for all the Medicines you take and that have been prescribed to you. Take any new Medicines after you have completely understood and accpet all the possible adverse reactions/side effects.   Do not drive, operate heavy machinery, perform activities at heights, swimming or participation in water activities or provide baby sitting services if your were admitted for syncope or siezures until you have seen by  Primary MD or a Neurologist and advised to do so again.  Do not drive when taking Pain medications.   Procedures/Studies: IR IMAGING GUIDED PORT INSERTION  Result Date: 03/02/2023 INDICATION: History of metastatic lung cancer. In need of durable intravenous access for the initiation of chemotherapy. EXAM: IMPLANTED PORT A CATH PLACEMENT WITH ULTRASOUND AND FLUOROSCOPIC GUIDANCE COMPARISON:  Neck CT-02/23/2023 MEDICATIONS: None ANESTHESIA/SEDATION: Moderate (conscious) sedation was employed during this procedure as administered by the Interventional Radiology RN. A total of Benadryl 25 mg and Fentanyl 25 mcg was administered intravenously. Moderate Sedation Time: 23 minutes. The patient's level of consciousness and vital signs were monitored continuously by radiology nursing throughout the procedure under my direct supervision. CONTRAST:  None FLUOROSCOPY TIME:  24 seconds (4 mGy) COMPLICATIONS: None immediate. PROCEDURE: The procedure, risks, benefits, and alternatives were explained to the patient. Questions regarding the procedure were encouraged and answered. The patient understands and consents to the procedure. Given presence of known right supraclavicular bulky lymphadenopathy, the decision was made to proceed with left internal jugular approach port a catheter placement. As such, the left neck and chest were prepped with chlorhexidine in a sterile fashion, and a sterile drape was applied covering the operative field. Maximum barrier sterile technique with sterile gowns and gloves were used for the procedure. A timeout was performed prior to the initiation of the procedure. Local anesthesia was provided with 1% lidocaine with epinephrine. After creating a small venotomy incision, a micropuncture kit was utilized to access the internal jugular vein. Real-time ultrasound guidance was utilized for vascular access including the acquisition of a permanent ultrasound image documenting patency of the accessed  vessel. The microwire was utilized to measure appropriate catheter length. A subcutaneous port pocket was then created along the upper chest wall utilizing a combination of sharp and blunt dissection. The pocket was irrigated with sterile saline. A single lumen clear view power injectable port was chosen for placement. The 8 Fr catheter was tunneled from the port pocket site to the venotomy incision. The port was placed in the pocket. The external catheter was trimmed to appropriate length. At the venotomy, an 8 Fr peel-away sheath was placed over a guidewire under fluoroscopic guidance. The catheter was then placed through the sheath and the sheath was removed. Final catheter positioning was confirmed and documented with a fluoroscopic spot radiograph. The port was accessed with a Huber needle, aspirated and flushed with heparinized saline. The venotomy site was closed with an interrupted 4-0 Vicryl suture. The port pocket incision was closed with interrupted 2-0 Vicryl suture. Dermabond and Steri-strips were applied to both incisions. Dressings were applied. The patient tolerated the procedure well without immediate post procedural complication. FINDINGS: After catheter placement, the tip lies within the superior cavoatrial junction. The catheter aspirates and flushes normally and is ready for immediate use. IMPRESSION: Successful placement of a left internal jugular approach power injectable Port-A-Cath. The catheter is ready for immediate use. Electronically Signed   By: Simonne Come M.D.   On: 03/02/2023 15:05   CT  BONE MARROW BIOPSY & ASPIRATION  Result Date: 03/02/2023 INDICATION: History of metastatic lung cancer, now with thrombocytopenia. Please perform CT-guided bone marrow biopsy for tissue diagnostic purposes. EXAM: CT-GUIDED BONE MARROW BIOPSY AND ASPIRATION MEDICATIONS: None ANESTHESIA/SEDATION: Moderate (conscious) sedation was employed during this procedure as administered by the Interventional  Radiology RN. A total of Benadryl 25 mg, Versed 1 mg and Fentanyl 50 mcg was administered intravenously. Moderate Sedation Time: 10 minutes. The patient's level of consciousness and vital signs were monitored continuously by radiology nursing throughout the procedure under my direct supervision. COMPLICATIONS: None immediate. PROCEDURE: Informed consent was obtained from the patient following an explanation of the procedure, risks, benefits and alternatives. The patient understands, agrees and consents for the procedure. All questions were addressed. A time out was performed prior to the initiation of the procedure. The patient was positioned prone and non-contrast localization CT was performed of the pelvis to demonstrate the iliac marrow spaces. The operative site was prepped and draped in the usual sterile fashion. Under sterile conditions and local anesthesia, a 22 gauge spinal needle was utilized for procedural planning. Next, an 11 gauge coaxial bone biopsy needle was advanced into the left iliac marrow space. Needle position was confirmed with CT imaging. Initially, a bone marrow aspiration was performed. Next, a bone marrow biopsy was obtained with the 11 gauge outer bone marrow device. The needle was removed and superficial hemostasis was obtained with manual compression. A dressing was applied. The patient tolerated the procedure well without immediate post procedural complication. IMPRESSION: Successful CT guided left iliac bone marrow aspiration and core biopsy. Electronically Signed   By: Simonne Come M.D.   On: 03/02/2023 15:03   IR US CHEST  Result Date: 02/28/2023 CLINICAL DATA:  Evaluate for thoracentesis. EXAM: CHEST ULTRASOUND COMPARISON:  Chest radiograph 02/28/2023 FINDINGS: Small amount of right pleural fluid was identified. No percutaneous window for thoracentesis due to a flap of lung. Thoracentesis not performed. IMPRESSION: Small right pleural effusion. Thoracentesis not performed due to  lack of a safe percutaneous window. Electronically Signed   By: Richarda Overlie M.D.   On: 02/28/2023 16:10   NM Bone Scan Whole Body  Result Date: 02/28/2023 CLINICAL DATA:  Lung cancer EXAM: NUCLEAR MEDICINE WHOLE BODY BONE SCAN TECHNIQUE: Whole body anterior and posterior images were obtained approximately 3 hours after intravenous injection of radiopharmaceutical. RADIOPHARMACEUTICALS:  21.0 mCi Technetium-66m MDP IV COMPARISON:  CT scan earlier June 2024 of multiple areas. FINDINGS: Physiologic distribution of radiotracer overall. There is some uptake involving the right midfoot consistent with known provided history of injury in the past. There is slight asymmetric uptake along the proximal left tibia from the other side. There is also some subtle areas of low uptake along the distal femoral metaphysis. Recommend dedicated x-rays. Mild areas of degenerative uptake such as along the extremities. Of note the kidneys are not as well seen as typical. Please correlate with clinical findings. IMPRESSION: Subtle asymmetric uptake involving the proximal left tibia. Slight low uptake along the distal femurs. Recommend dedicated x-rays if there is no known history. Atypical low uptake of the renal parenchyma. The rest of the findings do not suggest super scan but please correlate with the patient's renal function and other history. This may be technical. Electronically Signed   By: Karen Kays M.D.   On: 02/28/2023 14:40   DG CHEST PORT 1 VIEW  Result Date: 02/28/2023 CLINICAL DATA:  Pleural effusion on right. EXAM: PORTABLE CHEST 1 VIEW COMPARISON:  Radiographs  02/27/2023 and 02/23/2023. Abdominal CT 02/27/2023 and chest CT 02/23/2023. FINDINGS: 0845 hours. Mild patient rotation to the right. Grossly stable heart size and mediastinal contours with known extensive mediastinal lymphadenopathy. There are enlarging right greater than left pleural effusions with increasing atelectasis at both lung bases. No  pneumothorax. The bones appear unchanged. Telemetry leads overlie the chest. IMPRESSION: 1. Enlarging right greater than left pleural effusions with increasing bibasilar atelectasis. 2. Grossly stable mediastinal lymphadenopathy from recent chest CT, presumed lung cancer. Electronically Signed   By: Carey Bullocks M.D.   On: 02/28/2023 12:55   MR BRAIN W WO CONTRAST  Result Date: 02/28/2023 CLINICAL DATA:  Staging of small cell lung carcinoma EXAM: MRI HEAD WITHOUT AND WITH CONTRAST TECHNIQUE: Multiplanar, multiecho pulse sequences of the brain and surrounding structures were obtained without and with intravenous contrast. CONTRAST:  7mL GADAVIST GADOBUTROL 1 MMOL/ML IV SOLN COMPARISON:  None Available. FINDINGS: Brain: No acute infarct, mass effect or extra-axial collection. No acute or chronic hemorrhage. Normal white matter signal. On diffusion-weighted imaging and the FLAIR sequence, there are bilateral convexity signal abnormalities within the subdural space. The midline structures are normal. There is smooth, diffuse pachymeningeal contrast enhancement. There are no intraparenchymal contrast enhancing lesions. Vascular: Major flow voids are preserved. Skull and upper cervical spine: Normal calvarium and skull base. Visualized upper cervical spine and soft tissues are normal. Sinuses/Orbits:Mastoid effusions. Paranasal sinuses are clear. Normal orbits. IMPRESSION: 1. No intraparenchymal metastatic disease. 2. Bilateral convexity signal abnormalities within the subdural space, which may indicate aging subdural blood. 3. Smooth, diffuse pachymeningeal contrast enhancement. 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 Metastatic disease felt less likely given the lack nodularity. Electronically Signed   By: Deatra Robinson M.D.   On: 02/28/2023 02:17   DG CHEST PORT 1 VIEW  Result Date:  02/27/2023 CLINICAL DATA:  Shortness of breath. EXAM: PORTABLE CHEST 1 VIEW COMPARISON:  February 23, 2023. FINDINGS: Stable cardiomegaly. Mild central pulmonary vascular congestion is noted. Minimal bibasilar pulmonary edema is noted with small right pleural effusion. Bony thorax is unremarkable. IMPRESSION: Stable cardiomegaly with mild central pulmonary vascular congestion. Minimal bibasilar pulmonary edema is noted with probable small right pleural effusion. Electronically Signed   By: Lupita Raider M.D.   On: 02/27/2023 16:27   CT ABDOMEN PELVIS W CONTRAST  Result Date: 02/27/2023 CLINICAL DATA:  Non-small-cell lung cancer. Staging. * Tracking Code: BO * EXAM: CT ABDOMEN AND PELVIS WITH CONTRAST TECHNIQUE: Multidetector CT imaging of the abdomen and pelvis was performed using the standard protocol following bolus administration of intravenous contrast. RADIATION DOSE REDUCTION: This exam was performed according to the departmental dose-optimization program which includes automated exposure control, adjustment of the mA and/or kV according to patient size and/or use of iterative reconstruction technique. CONTRAST:  75mL OMNIPAQUE IOHEXOL 350 MG/ML SOLN COMPARISON:  Chest CTA 02/23/2023 FINDINGS: Lower chest: Bibasilar collapse/consolidation with bilateral pleural effusions, right greater than left. Pleural effusions are progressive since the chest CT 4 days ago. Hepatobiliary: Small area of low attenuation in the anterior liver, adjacent to the falciform ligament, is in a characteristic location for focal fatty deposition. 9 mm hypodensity in the left liver (15/3) is too small to characterize but statistically is likely benign. There is no evidence for gallstones, gallbladder wall thickening, or pericholecystic fluid. No intrahepatic or extrahepatic biliary dilation. Pancreas: No focal mass lesion. No dilatation of the main duct.  No intraparenchymal cyst. No peripancreatic edema. Spleen: No splenomegaly. No  suspicious focal mass lesion. Adrenals/Urinary Tract: No adrenal nodule or mass. Kidneys unremarkable. No evidence for hydroureter. The urinary bladder appears normal for the degree of distention. Stomach/Bowel: Stomach is distended with food and fluid. Duodenum is normally positioned as is the ligament of Treitz. No small bowel wall thickening. No small bowel dilatation. The terminal ileum is normal. The appendix is normal. No gross colonic mass. No colonic wall thickening. Vascular/Lymphatic: Moderate atherosclerotic calcification is noted in the wall of the aorta. There is no gastrohepatic or hepatoduodenal ligament lymphadenopathy. No retroperitoneal or mesenteric lymphadenopathy. No pelvic sidewall lymphadenopathy. Reproductive: Unremarkable. Other: No intraperitoneal free fluid. Musculoskeletal: No worrisome lytic or sclerotic osseous abnormality. IMPRESSION: 1. No definite evidence for metastatic disease in the abdomen or pelvis. 2. 9 mm hypodensity in the left liver is too small to characterize but statistically is likely benign. Attention on follow-up recommended. 3. Bibasilar collapse/consolidation with bilateral pleural effusions, right greater than left. Pleural effusions are progressive since the chest CT 4 days ago. 4.  Aortic Atherosclerosis (ICD10-I70.0). Electronically Signed   By: Kennith Center M.D.   On: 02/27/2023 11:47   [place] CORE BIOPSY (SOFT TISSUE)  Result Date: 02/26/2023 INDICATION: Cervical and supraclavicular adenopathy.  No diagnosis. EXAM: ULTRASOUND-GUIDED RIGHT SUPRACLAVICULAR NODAL MASS BIOPSY COMPARISON:  CT neck and chest, 09/24/2022. MEDICATIONS: None ANESTHESIA/SEDATION: Local anesthetic was administered. COMPLICATIONS: None immediate. TECHNIQUE: Informed written consent was obtained from the patient and/or patient's representative after a discussion of the risks, benefits and alternatives to treatment. Questions regarding the procedure were encouraged and answered. Initial  ultrasound scanning demonstrated RIGHT supraclavicular nodal mass. An ultrasound image was saved for documentation purposes. The procedure was planned. A timeout was performed prior to the initiation of the procedure. The operative was prepped and draped in the usual sterile fashion, and a sterile drape was applied covering the operative field. A timeout was performed prior to the initiation of the procedure. Local anesthesia was provided with 1% lidocaine with epinephrine. Under direct ultrasound guidance, an 18 gauge core needle device was utilized to obtain to obtain 3 core needle biopsies of the RIGHT supraclavicular nodal mass. The samples were placed in saline and submitted to pathology. The needle was removed and superficial hemostasis was achieved with manual compression. Post procedure scan was negative for significant hematoma. A dressing was applied. The patient tolerated the procedure well without immediate postprocedural complication. IMPRESSION: Successful core biopsy of RIGHT supraclavicular nodal mass. Roanna Banning, MD [facility] Electronically Signed   By: Roanna Banning M.D.   On: 02/26/2023 10:15   ECHOCARDIOGRAM COMPLETE  Result Date: 02/24/2023    ECHOCARDIOGRAM REPORT   Patient Name:   JENNEFER KOPP Date of Exam: 02/24/2023 Medical Rec #:  [id number]         Height:       68.0 in Accession #:    [id number]        Weight:       157.0 lb Date of Birth:  November 15, 1970        BSA:          1.844 m Patient Age:    51 years          BP:           111/73 mmHg Patient Gender: F                 HR:  88 bpm. Exam Location:  Inpatient Procedure: 2D Echo, Cardiac Doppler and Color Doppler Indications:    A-FIB  History:        Patient has no prior history of Echocardiogram examinations.                 Arrythmias:Atrial Fibrillation; Risk Factors:Current Smoker.  Sonographer:    Darlys Gales Referring Phys: [phone] JACOB J STINSON  IMPRESSIONS  1. Left ventricular ejection fraction, by estimation, is 60 to 65%. The left ventricle has normal function. The left ventricle has no regional wall motion abnormalities. Left ventricular diastolic parameters are indeterminate.  2. Right ventricular systolic function is normal. The right ventricular size is normal. Tricuspid regurgitation signal is inadequate for assessing PA pressure.  3. The mitral valve is normal in structure. No evidence of mitral valve regurgitation.  4. The aortic valve is grossly normal. Aortic valve regurgitation is not visualized.  5. The inferior vena cava is dilated in size with <50% respiratory variability, suggesting right atrial pressure of 15 mmHg. FINDINGS  Left Ventricle: Left ventricular ejection fraction, by estimation, is 60 to 65%. The left ventricle has normal function. The left ventricle has no regional wall motion abnormalities. The left ventricular internal cavity size was normal in size. There is  no left ventricular hypertrophy. Left ventricular diastolic parameters are indeterminate. Right Ventricle: The right ventricular size is normal. Right ventricular systolic function is normal. Tricuspid regurgitation signal is inadequate for assessing PA pressure. Left Atrium: Left atrial size was normal in size. Right Atrium: Right atrial size was normal in size. Pericardium: There is no evidence of pericardial effusion. Mitral Valve: The mitral valve is normal in structure. No evidence of mitral valve regurgitation. Tricuspid Valve: Tricuspid valve regurgitation is not demonstrated. Aortic Valve: The aortic valve is grossly normal. Aortic valve regurgitation is not visualized. Aortic valve mean gradient measures 5.0 mmHg. Aortic valve peak gradient measures 7.6 mmHg. Aortic valve area, by VTI measures 2.82 cm. Pulmonic Valve: Pulmonic valve regurgitation is not visualized. Aorta: The aortic root and ascending aorta are structurally normal, with no evidence of  dilitation. Venous: The inferior vena cava is dilated in size with less than 50% respiratory variability, suggesting right atrial pressure of 15 mmHg. IAS/Shunts: The interatrial septum was not well visualized.  LEFT VENTRICLE PLAX 2D LVIDd:         5.10 cm   Diastology LVIDs:         3.00 cm   LV e' medial:    7.07 cm/s LV PW:         0.80 cm   LV E/e' medial:  12.3 LV IVS:        0.70 cm   LV e' lateral:   10.20 cm/s LVOT diam:     1.80 cm   LV E/e' lateral: 8.5 LV SV:         76 LV SV Index:   41 LVOT Area:     2.54 cm  RIGHT VENTRICLE             IVC RV S prime:     15.10 cm/s  IVC diam: 2.50 cm TAPSE (M-mode): 2.5 cm LEFT ATRIUM             Index        RIGHT ATRIUM           Index LA Vol (A2C):   21.7 ml 11.77 ml/m  RA Area:     14.00 cm LA Vol (A4C):  27.3 ml 14.81 ml/m  RA Volume:   34.60 ml  18.76 ml/m LA Biplane Vol: 24.7 ml 13.40 ml/m  AORTIC VALVE AV Area (Vmax):    2.42 cm AV Area (Vmean):   2.26 cm AV Area (VTI):     2.82 cm AV Vmax:           138.00 cm/s AV Vmean:          102.000 cm/s AV VTI:            0.269 m AV Peak Grad:      7.6 mmHg AV Mean Grad:      5.0 mmHg LVOT Vmax:         131.00 cm/s LVOT Vmean:        90.500 cm/s LVOT VTI:          0.298 m LVOT/AV VTI ratio: 1.11  AORTA Ao Root diam: 3.20 cm Ao Asc diam:  3.50 cm MITRAL VALVE MV Area (PHT): 5.23 cm     SHUNTS MV Decel Time: 145 msec     Systemic VTI:  0.30 m MV E velocity: 87.00 cm/s   Systemic Diam: 1.80 cm MV A velocity: 102.00 cm/s MV E/A ratio:  0.85 Photographer signed by Carolan Clines Signature Date/Time: 02/24/2023/3:44:27 PM    Final    CT Angio Chest PE W and/or Wo Contrast  Result Date: 02/23/2023 CLINICAL DATA:  Shortness of breath, right neck mass, chest mass, headache * Tracking Code: BO * EXAM: CT ANGIOGRAPHY CHEST WITH CONTRAST TECHNIQUE: Multidetector CT imaging of the chest was performed using the standard protocol during bolus administration of intravenous contrast. Multiplanar CT image  reconstructions and MIPs were obtained to evaluate the vascular anatomy. RADIATION DOSE REDUCTION: This exam was performed according to the departmental dose-optimization program which includes automated exposure control, adjustment of the mA and/or kV according to patient size and/or use of iterative reconstruction technique. CONTRAST:  75mL OMNIPAQUE IOHEXOL 350 MG/ML SOLN COMPARISON:  None Available. FINDINGS: Cardiovascular: No filling defect is identified in the pulmonary arterial tree to suggest pulmonary embolus. Mild atheromatous vascular calcification of the aortic arch. The large mediastinal mass bulges into the SVC posteriorly for example on image 128 series 4, probably from extrinsic compression, definite invasion is not observed. Because of lack of complete opacification from pulmonary arterial timing, patency of the left brachiocephalic vein is uncertain. Moderate pericardial effusion. Mediastinum/Nodes: Large right infrahilar and hilar mass extending into the mediastinum and tracking all the way to the thoracic inlet. This extends around the right mainstem bronchus, right upper lobe bronchus, and bronchus intermedius as well as the right lower lobe and right middle lobe bronchi, and likewise surrounds and narrows the right pulmonary artery and its branches. In the subcarinal region, the conglomerate mass measures about 7.9 by 5.6 cm (image 144, series 4) and the mass flattens and narrows the SVC without overt occlusion. In the right infrahilar region, the observed mass measures 4.7 by 3.9 cm on image 179 series 4. Presumed lymph node anterior to the SVC in the upper chest measures 2.6 cm in short axis on image 36 series 3. A right lower neck level IV mass/lymph node measures 3.4 cm in short axis on image 10 series 3. Lungs/Pleura: The mass in the right chest displaces the upper trachea to the left. There is substantial narrowing of the right upper lobe bronchus, right middle lobe bronchus, and severe  narrowing of the right lower lobe bronchus due to the surrounding mass.  Patchy regions of nodularity in the right lower lobe distal to the mass may represent postobstructive pneumonitis or satellite tumor nodules. Centrilobular emphysema. Scarring or atelectasis in the right middle lobe and right upper lobe anteriorly. Mild dependent atelectasis in both lower lobes. Trace right pleural effusion, image 50 series 3. Upper Abdomen: Small nonspecific hypodense lesion in the left hepatic lobe 0.9 cm in long axis on image 96 series 3. Musculoskeletal: Unremarkable Review of the MIP images confirms the above findings. IMPRESSION: 1. Large right infrahilar and hilar mass extending into the mediastinum and tracking all the way to the thoracic inlet. This extends around the right mainstem bronchus, right upper lobe bronchus, and bronchus intermedius as well as the right lower lobe and right middle lobe bronchi, and likewise surrounds and narrows the right pulmonary artery and its branches. The mass flattens and narrows the SVC without overt occlusion, and is confluent with bulky right level IV neck adenopathy. Top differential diagnostic considerations include lung cancer (such as squamous cell or small cell) versus extensive thoracic and right lower neck lymphoma. 2. Patchy regions of nodularity in the right lower lobe distal to the mass may represent postobstructive pneumonitis or satellite tumor nodules. 3. Presumed lymph node anterior to the SVC in the upper chest measures 2.6 cm in short axis. 4. A right lower neck level IV mass/lymph node measures 3.4 cm in short axis, compatible with malignancy. 5. Moderate pericardial effusion. 6. Trace right pleural effusion. 7. Centrilobular emphysema. 8. Small nonspecific hypodense lesion in the left hepatic lobe 0.9 cm in long axis. 9. No pulmonary embolus is identified. 10. Aortic atherosclerosis. Aortic Atherosclerosis (ICD10-I70.0) and Emphysema (ICD10-J43.9). Electronically  Signed   By: Gaylyn Rong M.D.   On: 02/23/2023 13:39   CT Soft Tissue Neck W Contrast  Result Date: 02/23/2023 CLINICAL DATA:  Neck mass, nonpulsatile right sided neck mass EXAM: CT NECK WITH CONTRAST TECHNIQUE: Multidetector CT imaging of the neck was performed using the standard protocol following the bolus administration of intravenous contrast. RADIATION DOSE REDUCTION: This exam was performed according to the departmental dose-optimization program which includes automated exposure control, adjustment of the mA and/or kV according to patient size and/or use of iterative reconstruction technique. CONTRAST:  75mL OMNIPAQUE IOHEXOL 350 MG/ML SOLN COMPARISON:  None Available. FINDINGS: Pharynx and larynx: Normal. No mass or swelling. Salivary glands: No inflammation, mass, or stone. Thyroid: Normal. Lymph nodes: There is bulky lower cervical and mediastinal lymphadenopathy involving the right level 3 and level 4 lymph node stations, the right supraclavicular region, and the anterior and posterior mediastinum. Cervical lymphadenopathy significant mass effect on the right internal jugular vein (series 5, image 68). Vascular: Negative. Mass effect on the right internal jugular vein, as above Limited intracranial: Negative. Visualized orbits: Negative. Mastoids and visualized paranasal sinuses: Moderate left-sided mastoid effusion. There is pneumatization of the petrous apices with air-fluid levels. Paranasal sinuses are clear. Orbits are unremarkable. Skeleton: No acute or aggressive process. Upper chest: Mild paraseptal emphysema. See same day CT chest for additional findings Other: None. IMPRESSION: 1. Bulky right lower cervical and mediastinal lymphadenopathy, concerning for metastatic disease or lymphoma. 2. See separate CT chest for additional findings. Emphysema (ICD10-J43.9). Electronically Signed   By: Lorenza Cambridge M.D.   On: 02/23/2023 13:17   CT Head Wo Contrast  Result Date:  02/23/2023 CLINICAL DATA:  Headache EXAM: CT HEAD WITHOUT CONTRAST TECHNIQUE: Contiguous axial images were obtained from the base of the skull through the vertex without intravenous contrast. RADIATION DOSE  REDUCTION: This exam was performed according to the departmental dose-optimization program which includes automated exposure control, adjustment of the mA and/or kV according to patient size and/or use of iterative reconstruction technique. COMPARISON:  None Available. FINDINGS: Brain: There is no acute intracranial hemorrhage, extra-axial fluid collection, or acute infarct Parenchymal volume is normal. The ventricles are normal in size. Gray-white differentiation is preserved The pituitary and suprasellar region are normal. There is no mass lesion. There is no mass effect or midline shift. Vascular: No hyperdense vessel or unexpected calcification. Skull: Normal. Negative for fracture or focal lesion. Sinuses/Orbits: The imaged paranasal sinuses are clear. The globes and orbits are unremarkable. Other: There are small bilateral mastoid effusions. IMPRESSION: 1. No acute intracranial pathology. 2. Small bilateral mastoid effusions. Electronically Signed   By: Lesia Hausen M.D.   On: 02/23/2023 13:15   DG Chest Port 1 View  Result Date: 02/23/2023 CLINICAL DATA:  Sepsis EXAM: PORTABLE CHEST 1 VIEW COMPARISON:  X-ray 01/09/2023 FINDINGS: No pneumothorax, effusion or edema. Calcified aorta. Overlapping cardiac leads. There is new widening of the mediastinum compared to prior x-ray and fullness of the right lung hilum. Recommend further evaluation with CT to delineate for potential mass. IMPRESSION: New widening of the mediastinum with masslike fullness in the right lung hilum. Recommend follow up contrast CT to further delineate Electronically Signed   By: Karen Kays M.D.   On: 02/23/2023 13:11     The results of significant diagnostics from this hospitalization (including imaging, microbiology, ancillary  and laboratory) are listed below for reference.     Microbiology: Recent Results (from the past 240 hour(s))  Culture, blood (Routine X 2) w Reflex to ID Panel     Status: None   Collection Time: 02/27/23 12:52 PM   Specimen: BLOOD RIGHT HAND  Result Value Ref Range Status   Specimen Description BLOOD RIGHT HAND  Final   Special Requests   Final    BOTTLES DRAWN AEROBIC AND ANAEROBIC Blood Culture adequate volume   Culture   Final    NO GROWTH 5 DAYS Performed at [facility], [address] [place], [place] [address]    Report Status 03/04/2023 FINAL  Final  Culture, blood (Routine X 2) w Reflex to ID Panel     Status: None   Collection Time: 02/27/23 12:53 PM   Specimen: BLOOD  Result Value Ref Range Status   Specimen Description BLOOD SITE NOT SPECIFIED  Final   Special Requests   Final    BOTTLES DRAWN AEROBIC AND ANAEROBIC Blood Culture adequate volume   Culture   Final    NO GROWTH 5 DAYS Performed at [facility], [address] [place], [place] [address]    Report Status 03/04/2023 FINAL  Final     Labs: BNP (last 3 results) Recent Labs    03/04/23 0500  BNP 70.4   Basic Metabolic Panel: Recent Labs  Lab 03/01/23 1050 03/02/23 1118 03/03/23 0508 03/04/23 0500 03/05/23 0822  NA 134* 134* 132* 133* 133*  K 3.4* 3.5 4.3 3.9 3.6  CL 100 100 104 102 101  CO2 23 23 21* 20* 24  GLUCOSE 107* 91 156* 139* 113*  BUN 19 17 20 16 15   CREATININE 0.96 0.96 0.79 0.90 0.83  CALCIUM 9.3 9.5 9.1 8.8* 9.4  MG  --   --   --  1.9 2.0   Liver Function Tests: Recent Labs  Lab 03/02/23 1118 03/04/23 0500 03/05/23 0822  AST  30 33 39  ALT 15 20 26   ALKPHOS 106 81 86  BILITOT 0.6 0.5 0.5  PROT 5.9* 5.4* 5.9*  ALBUMIN 2.9* 2.6* 2.8*   No results for input(s): "LIPASE", "AMYLASE" in the last 168 hours. No results for input(s): "AMMONIA" in the last 168 hours. CBC: Recent Labs  Lab 03/01/23 1050 03/02/23 0930 03/03/23 0508 03/04/23 0500  03/05/23 0822  WBC 5.4 7.5 8.2 9.4 6.8  HGB 11.6* 11.6* 10.8* 9.6* 10.1*  HCT 34.8* 34.9* 32.6* 29.8* 30.6*  MCV 94.8 94.6 97.0 98.7 97.5  PLT 47* 48* 50* 42* 33*   Cardiac Enzymes: No results for input(s): "CKTOTAL", "CKMB", "CKMBINDEX", "TROPONINI" in the last 168 hours. BNP: Invalid input(s): "POCBNP" CBG: No results for input(s): "GLUCAP" in the last 168 hours. D-Dimer No results for input(s): "DDIMER" in the last 72 hours. Hgb A1c No results for input(s): "HGBA1C" in the last 72 hours. Lipid Profile No results for input(s): "CHOL", "HDL", "LDLCALC", "TRIG", "CHOLHDL", "LDLDIRECT" in the last 72 hours. Thyroid function studies No results for input(s): "TSH", "T4TOTAL", "T3FREE", "THYROIDAB" in the last 72 hours.  Invalid input(s): "FREET3" Anemia work up No results for input(s): "VITAMINB12", "FOLATE", "FERRITIN", "TIBC", "IRON", "RETICCTPCT" in the last 72 hours. Urinalysis    Component Value Date/Time   COLORURINE YELLOW 02/23/2023 1305   APPEARANCEUR HAZY (A) 02/23/2023 1305   LABSPEC >1.046 (H) 02/23/2023 1305   PHURINE 5.0 02/23/2023 1305   GLUCOSEU NEGATIVE 02/23/2023 1305   HGBUR SMALL (A) 02/23/2023 1305   BILIRUBINUR NEGATIVE 02/23/2023 1305   KETONESUR NEGATIVE 02/23/2023 1305   PROTEINUR 30 (A) 02/23/2023 1305   NITRITE NEGATIVE 02/23/2023 1305   LEUKOCYTESUR NEGATIVE 02/23/2023 1305   Sepsis Labs Recent Labs  Lab 03/02/23 0930 03/03/23 0508 03/04/23 0500 03/05/23 0822  WBC 7.5 8.2 9.4 6.8   Microbiology Recent Results (from the past 240 hour(s))  Culture, blood (Routine X 2) w Reflex to ID Panel     Status: None   Collection Time: 02/27/23 12:52 PM   Specimen: BLOOD RIGHT HAND  Result Value Ref Range Status   Specimen Description BLOOD RIGHT HAND  Final   Special Requests   Final    BOTTLES DRAWN AEROBIC AND ANAEROBIC Blood Culture adequate volume   Culture   Final    NO GROWTH 5 DAYS Performed at [facility], [address],  [place], [place] [address]    Report Status 03/04/2023 FINAL  Final  Culture, blood (Routine X 2) w Reflex to ID Panel     Status: None   Collection Time: 02/27/23 12:53 PM   Specimen: BLOOD  Result Value Ref Range Status   Specimen Description BLOOD SITE NOT SPECIFIED  Final   Special Requests   Final    BOTTLES DRAWN AEROBIC AND ANAEROBIC Blood Culture adequate volume   Culture   Final    NO GROWTH 5 DAYS Performed at [facility], [address], [place], [place] [address]    Report Status 03/04/2023 FINAL  Final     Time coordinating discharge:  I have spent 35 minutes face to face with the patient and on the ward discussing the patients care, assessment, plan and disposition with other care givers. >50% of the time was devoted counseling the patient about the risks and benefits of treatment/Discharge disposition and coordinating care.   SIGNED:   Dimple Nanas, MD  [facility] 03/05/2023, 1:36 PM   If 7PM-7AM, please contact night-coverage

## 2023-03-06 ENCOUNTER — Other Ambulatory Visit: Payer: Self-pay | Admitting: Nurse Practitioner

## 2023-03-06 ENCOUNTER — Other Ambulatory Visit: Payer: Self-pay

## 2023-03-06 ENCOUNTER — Encounter (HOSPITAL_COMMUNITY): Payer: Self-pay

## 2023-03-06 ENCOUNTER — Emergency Department (HOSPITAL_COMMUNITY): Payer: Medicaid Other

## 2023-03-06 ENCOUNTER — Ambulatory Visit: Admission: RE | Admit: 2023-03-06 | Payer: Medicaid Other | Source: Ambulatory Visit

## 2023-03-06 ENCOUNTER — Encounter: Payer: Self-pay | Admitting: Hematology and Oncology

## 2023-03-06 ENCOUNTER — Inpatient Hospital Stay (HOSPITAL_BASED_OUTPATIENT_CLINIC_OR_DEPARTMENT_OTHER): Payer: Medicaid Other | Admitting: Hematology and Oncology

## 2023-03-06 ENCOUNTER — Other Ambulatory Visit: Payer: Self-pay | Admitting: Hematology and Oncology

## 2023-03-06 ENCOUNTER — Inpatient Hospital Stay (HOSPITAL_COMMUNITY)
Admission: EM | Admit: 2023-03-06 | Discharge: 2023-04-12 | DRG: 189 | Disposition: E | Payer: Medicaid Other | Source: Ambulatory Visit | Attending: Internal Medicine | Admitting: Internal Medicine

## 2023-03-06 ENCOUNTER — Ambulatory Visit: Payer: No Typology Code available for payment source

## 2023-03-06 ENCOUNTER — Inpatient Hospital Stay: Payer: Medicaid Other

## 2023-03-06 VITALS — BP 98/57 | HR 117 | Temp 97.5°F | Resp 20

## 2023-03-06 DIAGNOSIS — Z8544 Personal history of malignant neoplasm of other female genital organs: Secondary | ICD-10-CM

## 2023-03-06 DIAGNOSIS — J90 Pleural effusion, not elsewhere classified: Secondary | ICD-10-CM | POA: Diagnosis present

## 2023-03-06 DIAGNOSIS — Z711 Person with feared health complaint in whom no diagnosis is made: Secondary | ICD-10-CM

## 2023-03-06 DIAGNOSIS — Z66 Do not resuscitate: Secondary | ICD-10-CM | POA: Diagnosis not present

## 2023-03-06 DIAGNOSIS — R627 Adult failure to thrive: Secondary | ICD-10-CM | POA: Diagnosis present

## 2023-03-06 DIAGNOSIS — Z7189 Other specified counseling: Secondary | ICD-10-CM | POA: Diagnosis not present

## 2023-03-06 DIAGNOSIS — D702 Other drug-induced agranulocytosis: Secondary | ICD-10-CM | POA: Diagnosis not present

## 2023-03-06 DIAGNOSIS — D696 Thrombocytopenia, unspecified: Secondary | ICD-10-CM | POA: Insufficient documentation

## 2023-03-06 DIAGNOSIS — Z923 Personal history of irradiation: Secondary | ICD-10-CM

## 2023-03-06 DIAGNOSIS — D61810 Antineoplastic chemotherapy induced pancytopenia: Secondary | ICD-10-CM | POA: Diagnosis present

## 2023-03-06 DIAGNOSIS — L732 Hidradenitis suppurativa: Secondary | ICD-10-CM | POA: Diagnosis present

## 2023-03-06 DIAGNOSIS — R4589 Other symptoms and signs involving emotional state: Secondary | ICD-10-CM | POA: Diagnosis not present

## 2023-03-06 DIAGNOSIS — R531 Weakness: Secondary | ICD-10-CM | POA: Diagnosis not present

## 2023-03-06 DIAGNOSIS — J9601 Acute respiratory failure with hypoxia: Secondary | ICD-10-CM | POA: Insufficient documentation

## 2023-03-06 DIAGNOSIS — Z9981 Dependence on supplemental oxygen: Secondary | ICD-10-CM | POA: Insufficient documentation

## 2023-03-06 DIAGNOSIS — Z885 Allergy status to narcotic agent status: Secondary | ICD-10-CM

## 2023-03-06 DIAGNOSIS — F17210 Nicotine dependence, cigarettes, uncomplicated: Secondary | ICD-10-CM | POA: Diagnosis present

## 2023-03-06 DIAGNOSIS — E222 Syndrome of inappropriate secretion of antidiuretic hormone: Secondary | ICD-10-CM | POA: Diagnosis present

## 2023-03-06 DIAGNOSIS — Z515 Encounter for palliative care: Secondary | ICD-10-CM | POA: Diagnosis not present

## 2023-03-06 DIAGNOSIS — R0902 Hypoxemia: Secondary | ICD-10-CM

## 2023-03-06 DIAGNOSIS — C7951 Secondary malignant neoplasm of bone: Secondary | ICD-10-CM | POA: Diagnosis present

## 2023-03-06 DIAGNOSIS — J449 Chronic obstructive pulmonary disease, unspecified: Secondary | ICD-10-CM | POA: Diagnosis present

## 2023-03-06 DIAGNOSIS — G893 Neoplasm related pain (acute) (chronic): Secondary | ICD-10-CM | POA: Diagnosis present

## 2023-03-06 DIAGNOSIS — C3491 Malignant neoplasm of unspecified part of right bronchus or lung: Secondary | ICD-10-CM | POA: Insufficient documentation

## 2023-03-06 DIAGNOSIS — C3481 Malignant neoplasm of overlapping sites of right bronchus and lung: Secondary | ICD-10-CM | POA: Diagnosis present

## 2023-03-06 DIAGNOSIS — R262 Difficulty in walking, not elsewhere classified: Secondary | ICD-10-CM | POA: Diagnosis present

## 2023-03-06 DIAGNOSIS — I959 Hypotension, unspecified: Secondary | ICD-10-CM | POA: Diagnosis present

## 2023-03-06 DIAGNOSIS — C7952 Secondary malignant neoplasm of bone marrow: Secondary | ICD-10-CM | POA: Diagnosis present

## 2023-03-06 DIAGNOSIS — Z801 Family history of malignant neoplasm of trachea, bronchus and lung: Secondary | ICD-10-CM

## 2023-03-06 DIAGNOSIS — I3139 Other pericardial effusion (noninflammatory): Secondary | ICD-10-CM | POA: Diagnosis present

## 2023-03-06 DIAGNOSIS — D6959 Other secondary thrombocytopenia: Secondary | ICD-10-CM | POA: Diagnosis present

## 2023-03-06 DIAGNOSIS — C3401 Malignant neoplasm of right main bronchus: Secondary | ICD-10-CM | POA: Diagnosis present

## 2023-03-06 DIAGNOSIS — H5789 Other specified disorders of eye and adnexa: Secondary | ICD-10-CM | POA: Diagnosis present

## 2023-03-06 DIAGNOSIS — C770 Secondary and unspecified malignant neoplasm of lymph nodes of head, face and neck: Secondary | ICD-10-CM | POA: Diagnosis present

## 2023-03-06 DIAGNOSIS — J9811 Atelectasis: Secondary | ICD-10-CM | POA: Diagnosis present

## 2023-03-06 DIAGNOSIS — F419 Anxiety disorder, unspecified: Secondary | ICD-10-CM | POA: Diagnosis present

## 2023-03-06 DIAGNOSIS — R000 Tachycardia, unspecified: Secondary | ICD-10-CM | POA: Diagnosis present

## 2023-03-06 DIAGNOSIS — C3411 Malignant neoplasm of upper lobe, right bronchus or lung: Secondary | ICD-10-CM | POA: Diagnosis present

## 2023-03-06 DIAGNOSIS — J9621 Acute and chronic respiratory failure with hypoxia: Secondary | ICD-10-CM | POA: Diagnosis present

## 2023-03-06 DIAGNOSIS — I871 Compression of vein: Secondary | ICD-10-CM | POA: Diagnosis not present

## 2023-03-06 DIAGNOSIS — K5909 Other constipation: Secondary | ICD-10-CM | POA: Diagnosis present

## 2023-03-06 DIAGNOSIS — Z79899 Other long term (current) drug therapy: Secondary | ICD-10-CM | POA: Diagnosis not present

## 2023-03-06 DIAGNOSIS — Z807 Family history of other malignant neoplasms of lymphoid, hematopoietic and related tissues: Secondary | ICD-10-CM

## 2023-03-06 DIAGNOSIS — K5903 Drug induced constipation: Secondary | ICD-10-CM | POA: Diagnosis present

## 2023-03-06 DIAGNOSIS — Z87412 Personal history of vulvar dysplasia: Secondary | ICD-10-CM

## 2023-03-06 DIAGNOSIS — R0602 Shortness of breath: Secondary | ICD-10-CM | POA: Diagnosis present

## 2023-03-06 DIAGNOSIS — T40605A Adverse effect of unspecified narcotics, initial encounter: Secondary | ICD-10-CM | POA: Diagnosis present

## 2023-03-06 DIAGNOSIS — T451X5A Adverse effect of antineoplastic and immunosuppressive drugs, initial encounter: Secondary | ICD-10-CM | POA: Diagnosis present

## 2023-03-06 DIAGNOSIS — I480 Paroxysmal atrial fibrillation: Secondary | ICD-10-CM | POA: Diagnosis present

## 2023-03-06 DIAGNOSIS — R591 Generalized enlarged lymph nodes: Secondary | ICD-10-CM | POA: Diagnosis present

## 2023-03-06 LAB — COMPREHENSIVE METABOLIC PANEL
ALT: 24 U/L (ref 0–44)
AST: 44 U/L — ABNORMAL HIGH (ref 15–41)
Albumin: 2.8 g/dL — ABNORMAL LOW (ref 3.5–5.0)
Alkaline Phosphatase: 84 U/L (ref 38–126)
Anion gap: 7 (ref 5–15)
BUN: 17 mg/dL (ref 6–20)
CO2: 26 mmol/L (ref 22–32)
Calcium: 8.9 mg/dL (ref 8.9–10.3)
Chloride: 95 mmol/L — ABNORMAL LOW (ref 98–111)
Creatinine, Ser: 0.76 mg/dL (ref 0.44–1.00)
GFR, Estimated: >60 mL/min (ref >60)
Glucose, Bld: 97 mg/dL (ref 70–99)
Potassium: 3.6 mmol/L (ref 3.5–5.1)
Sodium: 128 mmol/L — ABNORMAL LOW (ref 135–145)
Total Bilirubin: 0.9 mg/dL (ref 0.3–1.2)
Total Protein: 5.8 g/dL — ABNORMAL LOW (ref 6.5–8.1)

## 2023-03-06 LAB — CBC WITH DIFFERENTIAL/PLATELET
Abs Immature Granulocytes: 0.27 10*3/uL — ABNORMAL HIGH (ref 0.00–0.07)
Basophils Absolute: 0 10*3/uL (ref 0.0–0.1)
Basophils Relative: 0 %
Eosinophils Absolute: 0 10*3/uL (ref 0.0–0.5)
Eosinophils Relative: 0 %
HCT: 30.2 % — ABNORMAL LOW (ref 36.0–46.0)
Hemoglobin: 10 g/dL — ABNORMAL LOW (ref 12.0–15.0)
Immature Granulocytes: 6 %
Lymphocytes Relative: 29 %
Lymphs Abs: 1.4 10*3/uL (ref 0.7–4.0)
MCH: 31.8 pg (ref 26.0–34.0)
MCHC: 33.1 g/dL (ref 30.0–36.0)
MCV: 96.2 fL (ref 80.0–100.0)
Monocytes Absolute: 0 10*3/uL — ABNORMAL LOW (ref 0.1–1.0)
Monocytes Relative: 1 %
Neutro Abs: 3 10*3/uL (ref 1.7–7.7)
Neutrophils Relative %: 64 %
Platelets: 33 10*3/uL — ABNORMAL LOW (ref 150–400)
RBC: 3.14 MIL/uL — ABNORMAL LOW (ref 3.87–5.11)
RDW: 14.7 % (ref 11.5–15.5)
WBC: 4.7 10*3/uL (ref 4.0–10.5)
nRBC: 0 % (ref 0.0–0.2)

## 2023-03-06 LAB — URINALYSIS, ROUTINE W REFLEX MICROSCOPIC
Bilirubin Urine: NEGATIVE
Glucose, UA: NEGATIVE mg/dL
Hgb urine dipstick: NEGATIVE
Ketones, ur: NEGATIVE mg/dL
Leukocytes,Ua: NEGATIVE
Nitrite: NEGATIVE
Protein, ur: NEGATIVE mg/dL
Specific Gravity, Urine: 1.023 (ref 1.005–1.030)
pH: 6 (ref 5.0–8.0)

## 2023-03-06 LAB — MAGNESIUM: Magnesium: 1.9 mg/dL (ref 1.7–2.4)

## 2023-03-06 LAB — SURGICAL PATHOLOGY

## 2023-03-06 MED ORDER — ONDANSETRON HCL 4 MG/2ML IJ SOLN
4.0000 mg | Freq: Once | INTRAMUSCULAR | Status: AC
  Administered 2023-03-06: 4 mg via INTRAVENOUS
  Filled 2023-03-06: qty 2

## 2023-03-06 MED ORDER — DM-GUAIFENESIN ER 30-600 MG PO TB12
1.0000 | ORAL_TABLET | Freq: Two times a day (BID) | ORAL | Status: DC | PRN
  Administered 2023-03-10 – 2023-03-13 (×4): 1 via ORAL
  Filled 2023-03-06 (×5): qty 1

## 2023-03-06 MED ORDER — MORPHINE SULFATE (PF) 4 MG/ML IV SOLN
4.0000 mg | Freq: Once | INTRAVENOUS | Status: AC
  Administered 2023-03-06: 4 mg via INTRAVENOUS
  Filled 2023-03-06: qty 1

## 2023-03-06 MED ORDER — ACETAMINOPHEN 325 MG PO TABS
650.0000 mg | ORAL_TABLET | Freq: Four times a day (QID) | ORAL | Status: DC | PRN
  Administered 2023-03-07 – 2023-03-12 (×2): 650 mg via ORAL
  Filled 2023-03-06 (×2): qty 2

## 2023-03-06 MED ORDER — IOHEXOL 300 MG/ML  SOLN
100.0000 mL | Freq: Once | INTRAMUSCULAR | Status: AC | PRN
  Administered 2023-03-06: 100 mL via INTRAVENOUS

## 2023-03-06 MED ORDER — ALBUTEROL SULFATE HFA 108 (90 BASE) MCG/ACT IN AERS
2.0000 | INHALATION_SPRAY | Freq: Four times a day (QID) | RESPIRATORY_TRACT | Status: DC | PRN
  Filled 2023-03-06: qty 6.7

## 2023-03-06 MED ORDER — ACETAMINOPHEN 650 MG RE SUPP: 650.0000 mg | Freq: Four times a day (QID) | RECTAL | Status: DC | PRN

## 2023-03-06 MED ORDER — LACTATED RINGERS IV BOLUS
1000.0000 mL | Freq: Once | INTRAVENOUS | Status: AC
  Administered 2023-03-06: 1000 mL via INTRAVENOUS

## 2023-03-06 MED ORDER — FENTANYL CITRATE PF 50 MCG/ML IJ SOSY
50.0000 ug | PREFILLED_SYRINGE | Freq: Once | INTRAMUSCULAR | Status: AC
  Administered 2023-03-06: 50 ug via INTRAVENOUS
  Filled 2023-03-06: qty 1

## 2023-03-06 MED ORDER — SENNOSIDES-DOCUSATE SODIUM 8.6-50 MG PO TABS: 1.0000 | ORAL_TABLET | Freq: Every evening | ORAL | Status: DC | PRN

## 2023-03-06 MED ORDER — IPRATROPIUM-ALBUTEROL 0.5-2.5 (3) MG/3ML IN SOLN
3.0000 mL | RESPIRATORY_TRACT | Status: DC | PRN
  Administered 2023-03-07 – 2023-03-11 (×8): 3 mL via RESPIRATORY_TRACT
  Filled 2023-03-06 (×8): qty 3

## 2023-03-06 MED ORDER — POLYETHYLENE GLYCOL 3350 17 G PO PACK: 17.0000 g | PACK | Freq: Every day | ORAL | Status: DC

## 2023-03-06 MED ORDER — IPRATROPIUM-ALBUTEROL 0.5-2.5 (3) MG/3ML IN SOLN
3.0000 mL | Freq: Once | RESPIRATORY_TRACT | Status: AC
  Administered 2023-03-06: 3 mL via RESPIRATORY_TRACT
  Filled 2023-03-06: qty 3

## 2023-03-06 MED ORDER — SODIUM CHLORIDE 0.9% FLUSH
3.0000 mL | Freq: Two times a day (BID) | INTRAVENOUS | Status: DC
  Administered 2023-03-06 – 2023-03-15 (×16): 3 mL via INTRAVENOUS

## 2023-03-06 MED ORDER — HYDROMORPHONE HCL 1 MG/ML IJ SOLN
1.0000 mg | Freq: Once | INTRAMUSCULAR | Status: AC
  Administered 2023-03-06: 1 mg via INTRAVENOUS
  Filled 2023-03-06: qty 1

## 2023-03-06 MED ORDER — ONDANSETRON HCL 4 MG/2ML IJ SOLN: 4.0000 mg | Freq: Four times a day (QID) | INTRAMUSCULAR | Status: DC | PRN

## 2023-03-06 MED ORDER — HYDROMORPHONE HCL 1 MG/ML IJ SOLN
0.5000 mg | INTRAMUSCULAR | Status: DC | PRN
  Administered 2023-03-06 – 2023-03-10 (×15): 0.5 mg via INTRAVENOUS
  Filled 2023-03-06 (×16): qty 0.5

## 2023-03-06 MED ORDER — DIAZEPAM 5 MG PO TABS
10.0000 mg | ORAL_TABLET | Freq: Every day | ORAL | Status: DC | PRN
  Administered 2023-03-07: 10 mg via ORAL
  Filled 2023-03-06: qty 2

## 2023-03-06 MED ORDER — OXYCODONE HCL ER 10 MG PO T12A
10.0000 mg | EXTENDED_RELEASE_TABLET | Freq: Two times a day (BID) | ORAL | Status: DC
  Administered 2023-03-07 – 2023-03-11 (×9): 10 mg via ORAL
  Filled 2023-03-06 (×12): qty 1

## 2023-03-06 MED ORDER — AMIODARONE HCL 200 MG PO TABS: 200.0000 mg | ORAL_TABLET | Freq: Every day | ORAL | Status: DC

## 2023-03-06 MED ORDER — KETOROLAC TROMETHAMINE 15 MG/ML IJ SOLN
15.0000 mg | Freq: Once | INTRAMUSCULAR | Status: AC
  Administered 2023-03-06: 15 mg via INTRAVENOUS
  Filled 2023-03-06: qty 1

## 2023-03-06 MED ORDER — BISACODYL 5 MG PO TBEC: 5.0000 mg | DELAYED_RELEASE_TABLET | Freq: Every day | ORAL | Status: DC | PRN

## 2023-03-06 NOTE — Hospital Course (Signed)
Ana Curry is a 52 y.o. 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

## 2023-03-06 NOTE — ED Triage Notes (Signed)
Patient has lung cancer. Patient brought from the cancer center for back pain and extreme fatigue. Patient denies chest pain, increased SOB, dizziness, and fevers. Was supposed to have chemo and radiation. D/t pain she was brought here. Patient d/c'd yesterday from hospital.

## 2023-03-06 NOTE — ED Provider Notes (Signed)
[place] EMERGENCY DEPARTMENT AT [facility] Provider Note   CSN: [id number] Arrival date & time: 03/06/23  1313     History  Chief Complaint  Patient presents with   Back Pain    [name] is a 52 y.o. 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   Patient brought from the cancer center for back pain and extreme fatigue. . Was supposed to have chemo and radiation today but came here instead. Patient d/c'd yesterday from hospital, went home with new supplemental oxygen 3L and new amiodarone for Afib. +Decreased PO intake, severe generalized weakness/fatigue, diaphoresis. +back pain "all over." Had bone marrow biopsy on 6/22 and has pain at posterior superior iliac spine but also all-over back. Endorses diffuse headache, nausea with no vomiting. Patient denies chest pain, increased SOB, dizziness, and fevers/chills.   Per DC summary: 52 year old with history of vaginal intraepithelial neoplasm status post partial vulvectomy, tobacco use presented to the [facility] with complaints of ongoing shortness of breath for couple of months.  Prior to hospitalization there was suspicion for pneumonia which was treated by PCP with antibiotics.  Upon admission CT of the chest showed right infrahilar and hilar mass extending around right main stem bronchus, right upper lobe into thoracic inlet with bulky adenopathy.  EKG also revealed atrial fibrillation with RVR requiring IV Cardizem.  Patient was admitted to [facility] for multispecialty evaluation.  Cardiology, PCCM and oncology was consulted.  IR performed cervical node biopsy on 6/17 which eventually confirmed small cell carcinoma and due to concerns of SVC syndrome patient was transferred to [facility] for further management.  Patient also underwent Port-A-Cath  placement with bone marrow biopsy on 6/22. During the hospitalization patient was seen by oncology where she received chemotherapy, also underwent radiation on 6/21 and 6/24.  Today medically stable for discharge.   Back Pain      Home Medications Prior to Admission medications   Medication Sig Start Date End Date Taking? Authorizing Provider  acetaminophen (TYLENOL) 500 MG tablet Take 1,000 mg by mouth every 6 (six) hours as needed.    [provider]  amiodarone (PACERONE) 200 MG tablet Take 1 tablet (200 mg total) by mouth daily. 03/06/23   Amin, Loura Halt, MD  clotrimazole-betamethasone (LOTRISONE) cream Apply 1 Application topically daily. To feet. 02/23/23   [provider]  dextromethorphan-guaiFENesin (MUCINEX DM) 30-600 MG 12hr tablet Take 1 tablet by mouth 2 (two) times daily as needed for cough.    [provider]  diazepam (VALIUM) 10 MG tablet Take 10 mg by mouth daily as needed for anxiety. 02/23/23   [provider]  dicyclomine (BENTYL) 20 MG tablet Take 20 mg by mouth daily.    [provider]  ipratropium-albuterol (DUONEB) 0.5-2.5 (3) MG/3ML SOLN Take 3 mLs by nebulization every 4 (four) hours as needed. 03/05/23   Amin, Loura Halt, MD  nicotine (NICODERM CQ - DOSED IN MG/24 HOURS) 21 mg/24hr patch Place 1 patch (21 mg total) onto the skin daily. 03/06/23   Amin, Ankit Chirag, MD  polyethylene glycol (MIRALAX / GLYCOLAX) 17 g packet Take 17 g by mouth daily. 03/06/23   Amin, Ankit Chirag, MD  senna-docusate (SENOKOT-S) 8.6-50 MG tablet Take 1 tablet by mouth at bedtime as needed for mild constipation. 03/05/23   Amin, Ankit Chirag,  MD  triamcinolone ointment (KENALOG) 0.1 % Apply 1 Application topically daily as needed (Ear rash). 08/01/22 08/01/23  [provider]  VENTOLIN HFA 108 (90 Base) MCG/ACT inhaler Inhale 2 puffs into the lungs every 6 (six) hours as needed for wheezing or shortness of breath. 03/05/23   Dimple Nanas, MD      Allergies    Codeine    Review of Systems   Review of Systems  Musculoskeletal:  Positive for back pain.   A 10 point review of systems was performed and is negative unless otherwise reported in HPI.  Physical Exam Updated Vital Signs BP 104/65   Pulse (!) 107   Temp 98.2 F (36.8 C) (Oral)   Resp 18   Ht 5\' 8"  (1.727 m)   Wt 71.2 kg   LMP 01/22/2017   SpO2 92%   BMI 23.87 kg/m  Physical Exam General: ill-appearing female, lying in bed.  HEENT: Sclera anicteric, Dry mucous membranes, trachea midline.  Cardiology: regular tachycardic rate, no murmurs/rubs/gallops. BL radial and DP pulses equal bilaterally.  Resp: Normal respiratory rate and effort. +inspiratory and expiratory wheezes bilaterally. no rhonchi, crackles.  Abd: Mild diffuse abd TTP. Soft, non-distended. No rebound tenderness or guarding.  GU: Deferred. MSK: No peripheral edema or signs of trauma. Extremities without deformity or TTP. No cyanosis or clubbing. Skin: warm, dry. No rashes or lesions. Back: s/p bone marrow biopsy on L PSIS w/ small pinpoint healing wound with surrounding ecchymosis, no surrounding erythema/induration/fluctuance, no purulent drainage. No midline C T or L spine TTP. No CVA tenderness Neuro: A&Ox4, CNs II-XII grossly intact. MAEs. Sensation grossly intact.  Psych: Normal mood and affect.   ED Results / Procedures / Treatments   Labs (all labs ordered are listed, but only abnormal results are displayed) Labs Reviewed  CBC WITH DIFFERENTIAL/PLATELET  COMPREHENSIVE METABOLIC PANEL  MAGNESIUM  URINALYSIS, ROUTINE W REFLEX MICROSCOPIC    EKG None  Radiology DG Chest Portable 1 View  Result Date: 03/06/2023 CLINICAL DATA:  Fatigue and weakness.  On chemotherapy. EXAM: PORTABLE CHEST 1 VIEW COMPARISON:  Chest x-ray 02/28/2023 FINDINGS: Hyperinflation. Small right effusion with adjacent opacity similar to previous. Stable interstitial prominence. Stable  cardiopericardial silhouette. No pneumothorax. Again fullness of the right lung hilum. Left IJ chest port in place tip at the SVC right atrial junction. The port is accessed. IMPRESSION: Persistent right-sided effusion adjacent lung opacity with fullness of the right side of the lung hilum and widening of the mediastinum. Hyperinflation. Chest port Electronically Signed   By: Karen Kays M.D.   On: 03/06/2023 16:50    Procedures Procedures    Medications Ordered in ED Medications  lactated ringers bolus 1,000 mL (1,000 mLs Intravenous New Bag/Given 03/06/23 1638)  ondansetron (ZOFRAN) injection 4 mg (4 mg Intravenous Given 03/06/23 1613)  morphine (PF) 4 MG/ML injection 4 mg (4 mg Intravenous Given 03/06/23 1613)  ipratropium-albuterol (DUONEB) 0.5-2.5 (3) MG/3ML nebulizer solution 3 mL (3 mLs Nebulization Given 03/06/23 1639)    ED Course/ Medical Decision Making/ A&P                          Medical Decision Making Amount and/or Complexity of Data Reviewed Labs: ordered. Radiology: ordered.  Risk Prescription drug management.    This patient presents to the ED for concern of generalized weakness, back pain; this involves an extensive number of treatment options, and is a complaint that carries with it a high  risk of complications and morbidity.  I considered the following differential and admission for this acute, potentially life threatening condition.   MDM:    DDX for generalized weakness includes but is not limited to:  Infectious processes such as UTI or PNA; severe metabolic derangements or electrolyte abnormalities, ischemia/ACS though no CP or arrhythmia - had been recently diagnosed w/ Afib and now has regular rate on amiodarone; heart failure, anemia, and patient's chemotherapy/immunotherapy. Also consider dehydration w/ soft pressures in 90s and tachycardia w/ h/o decreased oral intake. Wheezing on exam bilaterally, she's satting 89-91% on her home 3L [place], will give duonebs  and reassess. CXR ordered w/ widened mediastinum, similar to priors. Does have recent h/o SVC syndrome diagnosed and reports increased facial edema as well, will get CT chest w contrast for evaluation. Does have diffuse abd TTP on exam, consider intraabdominal infection such as cholecystitis, appendicitis, diverticulitis. She also has diffuse back pain, no areas of point tenderness.  No areas of midline tenderness or step-off/deformities, no report of trauma that would indicate a fracture.  No areas of focal weakness or focal neurodeficits, just generalized weakness, no indication of spinal cord compression caused by spinal epidural abscess.  Consider retroperitoneal pathology though no CVA tenderness to indicate pyelonephritis or nephrolithiasis.  No evidence of infection at the bone marrow biopsy site, just ecchymosis.  Possible the patient is sore from the biopsy as well.    Labs: I Ordered, and personally interpreted labs.  The pertinent results include:  those listed above  Imaging Studies ordered: I ordered imaging studies including CXR I independently visualized and interpreted imaging. I agree with the radiologist interpretation  Additional history obtained from father, step-mother at bedside; chart review.    Social Determinants of Health: Patient lives independently   Disposition:  Patient is signed out to the oncoming ED physician Dr. Posey Rea who is made aware of her history, presentation, exam, workup, and plan. Plan is to obtain labs/imaging, reassess after duonebs/fluids.    Co morbidities that complicate the patient evaluation  Past Medical History:  Diagnosis Date   Hidradenitis suppurativa    History of abnormal cervical Pap smear    approx 2003  s/p LEEP   VIN III (vulvar intraepithelial neoplasia III)    Wears glasses      Medicines Meds ordered this encounter  Medications   lactated ringers bolus 1,000 mL   ondansetron (ZOFRAN) injection 4 mg   morphine (PF) 4  MG/ML injection 4 mg   ipratropium-albuterol (DUONEB) 0.5-2.5 (3) MG/3ML nebulizer solution 3 mL    I have reviewed the patients home medicines and have made adjustments as needed  Problem List / ED Course: Problem List Items Addressed This Visit   None Visit Diagnoses     Generalized weakness    -  Primary                   This note was created using dictation software, which may contain spelling or grammatical errors.    Loetta Rough, MD 03/06/23 [phone]

## 2023-03-06 NOTE — Assessment & Plan Note (Signed)
Unfortunately, she is developing worsening respiratory failure and unstable vital signs since discharge for the hospital We will cancel her treatment today I will direct her back to the emergency department for evaluation and admission We will hold her treatment today at the cancer center She completed cycle 1 of chemotherapy on Sunday

## 2023-03-06 NOTE — Assessment & Plan Note (Signed)
She is struggling to breathe Her lung exam showed good breath sounds but she has increased respiratory effort She has abnormal vital signs and I am concerned about worsening pulmonary function Will direct her to the emergency department for evaluation She will continue oxygen

## 2023-03-06 NOTE — ED Notes (Signed)
.ED TO INPATIENT HANDOFF REPORT  Name/Age/Gender Ana Curry 52 y.o. female  Code Status    Code Status Orders  (From admission, onward)           Start     Ordered   03/06/23 1947  Full code  Continuous       Question:  By:  Answer:  Consent: discussion documented in EHR   03/06/23 1947           Code Status History     Date Active Date Inactive Code Status Order ID Comments User Context   02/23/2023 1805 03/05/2023 1934 Full Code [id number]  Levie Heritage, DO ED   07/12/2022 1244 07/12/2022 2220 Full Code [id number]  Doylene Bode, NP Inpatient       Home/SNF/Other Home  Chief Complaint Acute respiratory failure with hypoxia (HCC) [J96.01]  Level of Care/Admitting Diagnosis ED Disposition     ED Disposition  Admit   Condition  --   Comment  Hospital Area: [facility] [[id number]]  Level of Care: Progressive [102]  Admit to Progressive based on following criteria: RESPIRATORY PROBLEMS hypoxemic/hypercapnic respiratory failure that is responsive to NIPPV (BiPAP) or High Flow Nasal Cannula (6-80 lpm). Frequent assessment/intervention, no > Q2 hrs < Q4 hrs, to maintain oxygenation and pulmonary hygiene.  May admit patient to [facility] or [facility] if equivalent level of care is available:: No  Covid Evaluation: Asymptomatic - no recent exposure (last 10 days) testing not required  Diagnosis: Acute respiratory failure with hypoxia [facility]) [[id number]]  Admitting Physician: Charlsie Quest [[id number]]  Attending Physician: Charlsie Quest [[id number]]  Certification:: I certify this patient will need inpatient services for at least 2 midnights  Estimated Length of Stay: 2          Medical History Past Medical History:  Diagnosis Date   Hidradenitis suppurativa    History of abnormal cervical Pap smear    approx 2003  s/p LEEP   VIN III (vulvar intraepithelial neoplasia III)    Wears glasses     Allergies Allergies  Allergen  Reactions   Codeine Nausea And Vomiting    IV Location/Drains/Wounds Patient Lines/Drains/Airways Status     Active Line/Drains/Airways     Name Placement date Placement time Site Days   Implanted Port 03/02/23 Left Chest 03/02/23  0941  Chest  4   Wound / Incision (Open or Dehisced) 03/02/23 Puncture Lumbar Lateral;Left Bone Marrow Biopsy 03/02/23  0846  Lumbar  4            Labs/Imaging Results for orders placed or performed during the hospital encounter of 03/06/23 (from the past 48 hour(s))  Urinalysis, Routine w reflex microscopic -Urine, Clean Catch     Status: None   Collection Time: 03/06/23  3:44 PM  Result Value Ref Range   Color, Urine YELLOW YELLOW   APPearance CLEAR CLEAR   Specific Gravity, Urine 1.023 1.005 - 1.030   pH 6.0 5.0 - 8.0   Glucose, UA NEGATIVE NEGATIVE mg/dL   Hgb urine dipstick NEGATIVE NEGATIVE   Bilirubin Urine NEGATIVE NEGATIVE   Ketones, ur NEGATIVE NEGATIVE mg/dL   Protein, ur NEGATIVE NEGATIVE mg/dL   Nitrite NEGATIVE NEGATIVE   Leukocytes,Ua NEGATIVE NEGATIVE    Comment: Performed at [facility], [address], [place], [place] [address]  CBC with Differential     Status: Abnormal   Collection Time: 03/06/23  4:14 PM  Result Value Ref Range  WBC 4.7 4.0 - 10.5 K/uL   RBC 3.14 (L) 3.87 - 5.11 MIL/uL   Hemoglobin 10.0 (L) 12.0 - 15.0 g/dL   HCT 40.9 (L) 81.1 - 91.4 %   MCV 96.2 80.0 - 100.0 fL   MCH 31.8 26.0 - 34.0 pg   MCHC 33.1 30.0 - 36.0 g/dL   RDW 78.2 95.6 - 21.3 %   Platelets 33 (L) 150 - 400 K/uL    Comment: SPECIMEN CHECKED FOR CLOTS Immature Platelet Fraction may be clinically indicated, consider ordering this additional test [id number] REPEATED TO VERIFY    nRBC 0.0 0.0 - 0.2 %   Neutrophils Relative % 64 %   Neutro Abs 3.0 1.7 - 7.7 K/uL   Lymphocytes Relative 29 %   Lymphs Abs 1.4 0.7 - 4.0 K/uL   Monocytes Relative 1 %   Monocytes Absolute 0.0 (L) 0.1 - 1.0 K/uL   Eosinophils Relative 0  %   Eosinophils Absolute 0.0 0.0 - 0.5 K/uL   Basophils Relative 0 %   Basophils Absolute 0.0 0.0 - 0.1 K/uL   Immature Granulocytes 6 %   Abs Immature Granulocytes 0.27 (H) 0.00 - 0.07 K/uL    Comment: Performed at [facility], [address], [place], [place] [address]  Comprehensive metabolic panel     Status: Abnormal   Collection Time: 03/06/23  4:14 PM  Result Value Ref Range   Sodium 128 (L) 135 - 145 mmol/L   Potassium 3.6 3.5 - 5.1 mmol/L   Chloride 95 (L) 98 - 111 mmol/L   CO2 26 22 - 32 mmol/L   Glucose, Bld 97 70 - 99 mg/dL    Comment: Glucose reference range applies only to samples taken after fasting for at least 8 hours.   BUN 17 6 - 20 mg/dL   Creatinine, Ser 2.84 0.44 - 1.00 mg/dL   Calcium 8.9 8.9 - 13.2 mg/dL   Total Protein 5.8 (L) 6.5 - 8.1 g/dL   Albumin 2.8 (L) 3.5 - 5.0 g/dL   AST 44 (H) 15 - 41 U/L   ALT 24 0 - 44 U/L   Alkaline Phosphatase 84 38 - 126 U/L   Total Bilirubin 0.9 0.3 - 1.2 mg/dL   GFR, Estimated >44 >01 mL/min    Comment: (NOTE) Calculated using the CKD-EPI Creatinine Equation (2021)    Anion gap 7 5 - 15    Comment: Performed at [facility], [address] [place], [place] [address]  Magnesium     Status: None   Collection Time: 03/06/23  4:14 PM  Result Value Ref Range   Magnesium 1.9 1.7 - 2.4 mg/dL    Comment: Performed at [facility], [address], [place], [place] [address]   CT Chest W Contrast  Result Date: 03/06/2023 CLINICAL DATA:  Small cell lung cancer. Increasing back pain and extreme fatigue. 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 * Tracking Code: BO * EXAM: CT CHEST, ABDOMEN, AND PELVIS WITH CONTRAST TECHNIQUE: Multidetector CT imaging of the chest, abdomen and pelvis was performed following the standard protocol during bolus administration of intravenous contrast. RADIATION DOSE REDUCTION: This exam was performed according to the departmental dose-optimization  program which includes automated exposure control, adjustment of the mA and/or kV according to patient size and/or use of iterative reconstruction technique. CONTRAST:  OMNIPAQUE IOHEXOL 300 MG/ML  SOLN COMPARISON:  Chest CT 02/23/2023.  Abdominopelvic CT 02/27/2023. FINDINGS: CT CHEST FINDINGS Cardiovascular: A left subclavian Port-A-Cath has  been placed, extending into the superior aspect of the right atrium. There is significant extrinsic compression of the right internal jugular, left brachiocephalic vein and SVC, similar to previous CT. There is also extrinsic mass effect on the right pulmonary artery and veins, also similar. No large vessel occlusion or intravascular thrombus identified. The heart size is stable. A moderate size pericardial effusion appears unchanged. Mediastinum/Nodes: Again demonstrated is extensive confluent lymphadenopathy extending from the right supraclavicular region through the mediastinum into the right hilum. There is a right supraclavicular component which is incompletely visualized, measuring approximately 4.9 x 3.7 cm on image 1/2. There is a right paratracheal component measuring 4.8 x 4.6 cm, and this has a 3.4 x 2.7 cm component extending anterior to the brachiocephalic veins on images 18/2. Overall, these lymph nodes are similar in size to the prior chest CT, although demonstrate lower central density suggesting response to treatment and necrosis. No progressive adenopathy identified. Stable extrinsic narrowing of the central right-sided airways without occlusion. The esophagus appears unremarkable. Lungs/Pleura: New moderate right and small left pleural effusions (compared with prior chest CT; minimally enlarged compared with more recent abdominal CT). Associated increased compressive atelectasis in the right middle lobe. There are additional increased patchy ground-glass opacities throughout both lungs which may reflect edema or posterior obstructive pneumonitis. There  is asymmetric central airway thickening on the right. Underlying mild to moderate centrilobular emphysema. No pneumothorax. Musculoskeletal/Chest wall: No chest wall mass or suspicious osseous findings. CT ABDOMEN AND PELVIS FINDINGS Hepatobiliary: The liver is normal in density without suspicious focal abnormality. Unchanged 9 mm low-density lesion in the left hepatic lobe on image 56/2, likely a cyst. No evidence of gallstones, gallbladder wall thickening or biliary dilatation. Pancreas: Unremarkable. No pancreatic ductal dilatation or surrounding inflammatory changes. Spleen: Normal in size without focal abnormality. Adrenals/Urinary Tract: Both adrenal glands appear normal. No evidence of urinary tract calculus, suspicious renal lesion or hydronephrosis. The bladder appears normal for its degree of distention. Stomach/Bowel: No enteric contrast administered. The stomach appears unremarkable for its degree of distension. No evidence of bowel wall thickening, distention or surrounding inflammatory change. Prominent stool throughout the colon. Vascular/Lymphatic: There are no enlarged abdominal or pelvic lymph nodes. Aortic and branch vessel atherosclerosis without evidence of aneurysm or large vessel occlusion. Reproductive: The uterus and ovaries appear normal. No adnexal mass. Other: No evidence of abdominal wall mass or hernia. No ascites or pneumoperitoneum. Musculoskeletal: No acute or significant osseous findings. IMPRESSION: 1. No significant change in size of extensive confluent lymphadenopathy extending from the right supraclavicular region through the mediastinum and right hilum. The lymph nodes demonstrate lower central density suggesting response to treatment and necrosis. 2. Grossly stable extrinsic compression of the right internal jugular vein, left brachiocephalic vein and SVC status post Port-A-Cath placement. These findings may contribute to symptoms of SVC syndrome. No evidence of large vessel  occlusion or intravascular thrombus. 3. No acute findings or evidence of metastatic disease in the abdomen or pelvis. 4. Mildly increased moderate right and small left pleural effusions with increased compressive atelectasis in the right middle lobe. There are additional increased patchy ground-glass opacities throughout both lungs which may reflect edema or posterior obstructive pneumonitis. 5. Stable moderate pericardial effusion. 6. Aortic Atherosclerosis (ICD10-I70.0) and Emphysema (ICD10-J43.9). Electronically Signed   By: Carey Bullocks M.D.   On: 03/06/2023 18:17   CT ABDOMEN PELVIS W CONTRAST  Result Date: 03/06/2023 CLINICAL DATA:  Small cell lung cancer. Increasing back pain and extreme fatigue. Concern for worsening  SVC syndrome. * Tracking Code: BO * EXAM: CT CHEST, ABDOMEN, AND PELVIS WITH CONTRAST TECHNIQUE: Multidetector CT imaging of the chest, abdomen and pelvis was performed following the standard protocol during bolus administration of intravenous contrast. RADIATION DOSE REDUCTION: This exam was performed according to the departmental dose-optimization program which includes automated exposure control, adjustment of the mA and/or kV according to patient size and/or use of iterative reconstruction technique. CONTRAST:  OMNIPAQUE IOHEXOL 300 MG/ML  SOLN COMPARISON:  Chest CT 02/23/2023.  Abdominopelvic CT 02/27/2023. FINDINGS: CT CHEST FINDINGS Cardiovascular: A left subclavian Port-A-Cath has been placed, extending into the superior aspect of the right atrium. There is significant extrinsic compression of the right internal jugular, left brachiocephalic vein and SVC, similar to previous CT. There is also extrinsic mass effect on the right pulmonary artery and veins, also similar. No large vessel occlusion or intravascular thrombus identified. The heart size is stable. A moderate size pericardial effusion appears unchanged. Mediastinum/Nodes: Again demonstrated is extensive confluent  lymphadenopathy extending from the right supraclavicular region through the mediastinum into the right hilum. There is a right supraclavicular component which is incompletely visualized, measuring approximately 4.9 x 3.7 cm on image 1/2. There is a right paratracheal component measuring 4.8 x 4.6 cm, and this has a 3.4 x 2.7 cm component extending anterior to the brachiocephalic veins on images 18/2. Overall, these lymph nodes are similar in size to the prior chest CT, although demonstrate lower central density suggesting response to treatment and necrosis. No progressive adenopathy identified. Stable extrinsic narrowing of the central right-sided airways without occlusion. The esophagus appears unremarkable. Lungs/Pleura: New moderate right and small left pleural effusions (compared with prior chest CT; minimally enlarged compared with more recent abdominal CT). Associated increased compressive atelectasis in the right middle lobe. There are additional increased patchy ground-glass opacities throughout both lungs which may reflect edema or posterior obstructive pneumonitis. There is asymmetric central airway thickening on the right. Underlying mild to moderate centrilobular emphysema. No pneumothorax. Musculoskeletal/Chest wall: No chest wall mass or suspicious osseous findings. CT ABDOMEN AND PELVIS FINDINGS Hepatobiliary: The liver is normal in density without suspicious focal abnormality. Unchanged 9 mm low-density lesion in the left hepatic lobe on image 56/2, likely a cyst. No evidence of gallstones, gallbladder wall thickening or biliary dilatation. Pancreas: Unremarkable. No pancreatic ductal dilatation or surrounding inflammatory changes. Spleen: Normal in size without focal abnormality. Adrenals/Urinary Tract: Both adrenal glands appear normal. No evidence of urinary tract calculus, suspicious renal lesion or hydronephrosis. The bladder appears normal for its degree of distention. Stomach/Bowel: No enteric  contrast administered. The stomach appears unremarkable for its degree of distension. No evidence of bowel wall thickening, distention or surrounding inflammatory change. Prominent stool throughout the colon. Vascular/Lymphatic: There are no enlarged abdominal or pelvic lymph nodes. Aortic and branch vessel atherosclerosis without evidence of aneurysm or large vessel occlusion. Reproductive: The uterus and ovaries appear normal. No adnexal mass. Other: No evidence of abdominal wall mass or hernia. No ascites or pneumoperitoneum. Musculoskeletal: No acute or significant osseous findings. IMPRESSION: 1. No significant change in size of extensive confluent lymphadenopathy extending from the right supraclavicular region through the mediastinum and right hilum. The lymph nodes demonstrate lower central density suggesting response to treatment and necrosis. 2. Grossly stable extrinsic compression of the right internal jugular vein, left brachiocephalic vein and SVC status post Port-A-Cath placement. These findings may contribute to symptoms of SVC syndrome. No evidence of large vessel occlusion or intravascular thrombus. 3. No acute findings or  evidence of metastatic disease in the abdomen or pelvis. 4. Mildly increased moderate right and small left pleural effusions with increased compressive atelectasis in the right middle lobe. There are additional increased patchy ground-glass opacities throughout both lungs which may reflect edema or posterior obstructive pneumonitis. 5. Stable moderate pericardial effusion. 6. Aortic Atherosclerosis (ICD10-I70.0) and Emphysema (ICD10-J43.9). Electronically Signed   By: Carey Bullocks M.D.   On: 03/06/2023 18:17   DG Chest Portable 1 View  Result Date: 03/06/2023 CLINICAL DATA:  Fatigue and weakness.  On chemotherapy. EXAM: PORTABLE CHEST 1 VIEW COMPARISON:  Chest x-ray 02/28/2023 FINDINGS: Hyperinflation. Small right effusion with adjacent opacity similar to previous. Stable  interstitial prominence. Stable cardiopericardial silhouette. No pneumothorax. Again fullness of the right lung hilum. Left IJ chest port in place tip at the SVC right atrial junction. The port is accessed. IMPRESSION: Persistent right-sided effusion adjacent lung opacity with fullness of the right side of the lung hilum and widening of the mediastinum. Hyperinflation. Chest port Electronically Signed   By: Karen Kays M.D.   On: 03/06/2023 16:50    Pending Labs Unresulted Labs (From admission, onward)     Start     Ordered   03/07/23 0500  CBC  Tomorrow morning,   R        03/06/23 1947   03/07/23 0500  Comprehensive metabolic panel  Tomorrow morning,   R        03/06/23 1947            Vitals/Pain Today's Vitals   03/06/23 1729 03/06/23 1745 03/06/23 1808 03/06/23 1814  BP:  113/69    Pulse:  (!) 108    Resp:  (!) 22    Temp:   98.3 F (36.8 C)   TempSrc:   Oral   SpO2:  93%    Weight:      Height:      PainSc: 8    10-Worst pain ever    Isolation Precautions No active isolations  Medications Medications  HYDROmorphone (DILAUDID) injection 0.5 mg (has no administration in time range)  ondansetron (ZOFRAN) injection 4 mg (has no administration in time range)  sodium chloride flush (NS) 0.9 % injection 3 mL (has no administration in time range)  acetaminophen (TYLENOL) tablet 650 mg (has no administration in time range)    Or  acetaminophen (TYLENOL) suppository 650 mg (has no administration in time range)  senna-docusate (Senokot-S) tablet 1 tablet (has no administration in time range)  bisacodyl (DULCOLAX) EC tablet 5 mg (has no administration in time range)  oxyCODONE (OXYCONTIN) 12 hr tablet 10 mg (has no administration in time range)  lactated ringers bolus 1,000 mL (0 mLs Intravenous Stopped 03/06/23 1800)  ondansetron (ZOFRAN) injection 4 mg (4 mg Intravenous Given 03/06/23 1613)  morphine (PF) 4 MG/ML injection 4 mg (4 mg Intravenous Given 03/06/23 1613)   ipratropium-albuterol (DUONEB) 0.5-2.5 (3) MG/3ML nebulizer solution 3 mL (3 mLs Nebulization Given 03/06/23 1639)  iohexol (OMNIPAQUE) 300 MG/ML solution 100 mL (100 mLs Intravenous Contrast Given 03/06/23 1733)  fentaNYL (SUBLIMAZE) injection 50 mcg (50 mcg Intravenous Given 03/06/23 1744)  ketorolac (TORADOL) 15 MG/ML injection 15 mg (15 mg Intravenous Given 03/06/23 1744)  HYDROmorphone (DILAUDID) injection 1 mg (1 mg Intravenous Given 03/06/23 1829)    Mobility walks with person assist

## 2023-03-06 NOTE — Progress Notes (Signed)
Non covered SA opiod previously replaced by other provider

## 2023-03-06 NOTE — Assessment & Plan Note (Signed)
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

## 2023-03-06 NOTE — H&P (Signed)
History and Physical    Ana Curry GMW:[id number] DOB: 19-Jan-1971 DOA: 03/06/2023  PCP: [facility]  Patient coming from: Home  I have personally briefly reviewed patient's old medical records in [facility]  Chief Complaint: Fatigue, shortness of breath  HPI: Ana Curry is a 52 y.o. 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  Patient just discharged from hospital after prolonged admission from 6/14-6/24.  She was diagnosed with small cell carcinoma of the right lung with extension of right infrahilar, right mainstem bronchus, right upper lobe and thoracic lymphadenopathy.    Diagnosis was confirmed with cervical node biopsy 6/17.  CT abdomen/pelvis and MRI brain did not show any metastatic disease.  Bone scan showed subtle asymmetric uptake involving proximal left tibia.    Bone marrow biopsy 6/21 has now resulted and consistent with metastatic small cell carcinoma.  I discussed these findings with patient on admission.  Patient was started on chemotherapy with first cycle on 6/21.  She underwent radiation treatment on 6/21 and 6/24.  During hospitalization she developed A-fib with RVR initially treated with Cardizem and amiodarone drip.  She was transitioned to oral amiodarone.  Noted to have severe thrombocytopenia.  She was seen by cardiology who did not recommend anticoagulation.  Patient was discharged to home yesterday after her radiation treatment.  She was seen in oncology clinic today with plan to continue on radiation therapy and immunotherapy.  Radiation machine however was not working.  She was noted to be ill-appearing, hypotensive and tachycardic, and short of  breath.  She was sent to the ED for further evaluation.  Patient has been generally weak, difficulty walking due to to significant fatigue.  She has not fallen or lost consciousness.  She has been short of breath, worse when laying flat.  She has been experiencing pain all over her body which is relatively new.  She has not had any significant nausea, vomiting, abdominal pain.  Appetite has been okay.  ED Course  Labs/Imaging on admission: I have personally reviewed following labs and imaging studies.  Initial vitals showed BP 97/68, pulse 101, RR 22, temp 98.2 F, SpO2 93% on 3 L O2 via [place].  Labs show WBC 4.7, hemoglobin 10.0, platelets 33,000, sodium 128, potassium 3.6, bicarb 26, BUN 17, creatinine 0.76, serum glucose 97.  UA negative.  CT chest/abdomen/pelvis obtained.  No significant change in size of extensive confluent lymphadenopathy extending from right supraclavicular region through the mediastinum and right hilum.  Stable extrinsic compression of the right internal jugular vein, left brachiocephalic vein, and SVC s/p Port-A-Cath placement.  No evidence of large vessel occlusion or intravascular thrombus.  No acute findings or evidence of metastatic disease in the abdomen or pelvis.  Mildly increased moderate right and small left pleural effusions noted with increased compressive atelectasis in the right middle lobe.  Additionally increased patchy groundglass opacities throughout both lungs noted.  Stable moderate pericardial effusion seen.  Patient was given 1 L LR, IV morphine, Dilaudid, fentanyl, Zofran.  The hospitalist service was consulted to admit for further evaluation and management.  Review of Systems: All systems reviewed and are negative except as documented in history of present illness above.   Past Medical History:  Diagnosis Date  Hidradenitis suppurativa    History of abnormal cervical Pap smear    approx 2003  s/p LEEP   VIN III (vulvar intraepithelial neoplasia  III)    Wears glasses     Past Surgical History:  Procedure Laterality Date   IR IMAGING GUIDED PORT INSERTION  03/02/2023   NO PAST SURGERIES     VULVA Ples Specter BIOPSY N/A 07/12/2022   Procedure: VULVAR BIOPSY;  Surgeon: Carver Fila, MD;  Location: [facility];  Service: Gynecology;  Laterality: N/A;   VULVECTOMY PARTIAL N/A 07/12/2022   Procedure: WIDE EXCISION VULVA;  Surgeon: Carver Fila, MD;  Location: [facility];  Service: Gynecology;  Laterality: N/A;    Social History:  reports that she has been smoking cigarettes. She started smoking about 38 years ago. She has been smoking an average of 1.5 packs per day. She has never used smokeless tobacco. She reports that she does not currently use alcohol. She reports that she does not use drugs.  Allergies  Allergen Reactions   Codeine Nausea And Vomiting    Family History  Problem Relation Age of Onset   Non-Hodgkin's lymphoma Mother 84   Colon cancer Neg Hx    Breast cancer Neg Hx    Ovarian cancer Neg Hx    Endometrial cancer Neg Hx    Pancreatic cancer Neg Hx    Prostate cancer Neg Hx      Prior to Admission medications   Medication Sig Start Date End Date Taking? Authorizing Provider  acetaminophen (TYLENOL) 500 MG tablet Take 1,000 mg by mouth every 6 (six) hours as needed.    [provider]  amiodarone (PACERONE) 200 MG tablet Take 1 tablet (200 mg total) by mouth daily. 03/06/23   Amin, Loura Halt, MD  clotrimazole-betamethasone (LOTRISONE) cream Apply 1 Application topically daily. To feet. 02/23/23   [provider]  dextromethorphan-guaiFENesin (MUCINEX DM) 30-600 MG 12hr tablet Take 1 tablet by mouth 2 (two) times daily as needed for cough.    [provider]  diazepam (VALIUM) 10 MG tablet Take 10 mg by mouth daily as needed for anxiety. 02/23/23   [provider]  dicyclomine (BENTYL) 20 MG tablet Take 20 mg by mouth daily.     [provider]  ipratropium-albuterol (DUONEB) 0.5-2.5 (3) MG/3ML SOLN Take 3 mLs by nebulization every 4 (four) hours as needed. 03/05/23   Amin, Loura Halt, MD  nicotine (NICODERM CQ - DOSED IN MG/24 HOURS) 21 mg/24hr patch Place 1 patch (21 mg total) onto the skin daily. 03/06/23   Amin, Ankit Chirag, MD  polyethylene glycol (MIRALAX / GLYCOLAX) 17 g packet Take 17 g by mouth daily. 03/06/23   Amin, Ankit Chirag, MD  senna-docusate (SENOKOT-S) 8.6-50 MG tablet Take 1 tablet by mouth at bedtime as needed for mild constipation. 03/05/23   Amin, Loura Halt, MD  triamcinolone ointment (KENALOG) 0.1 % Apply 1 Application topically daily as needed (Ear rash). 08/01/22 08/01/23  [provider]  VENTOLIN HFA 108 (90 Base) MCG/ACT inhaler Inhale 2 puffs into the lungs every 6 (six) hours as needed for wheezing or shortness of breath. 03/05/23   Dimple Nanas, MD    Physical Exam: Vitals:   03/06/23 1726 03/06/23 1745 03/06/23 1808 03/06/23 2000  BP: 98/62 113/69  118/72  Pulse: 100 (!) 108  (!) 103  Resp: 16 (!) 22    Temp:   98.3 F (36.8 C)   TempSrc:   Oral  SpO2: 92% 93%  96%  Weight:      Height:       Constitutional: Chronically ill-appearing woman resting in bed, appears fatigued but in NAD, calm Eyes: PERRL, lids and conjunctivae normal ENMT: Mucous membranes are moist. Posterior pharynx clear of any exudate or lesions.Normal dentition.  Neck: normal, supple, no masses. Respiratory: clear to auscultation bilaterally, no wheezing, no crackles. Normal respiratory effort. No accessory muscle use.  Cardiovascular: Regular rate and rhythm, no murmurs / rubs / gallops. No extremity edema. 2+ pedal pulses. Abdomen: no tenderness, no masses palpated. No hepatosplenomegaly. Bowel sounds positive.  Musculoskeletal: no clubbing / cyanosis. No joint deformity upper and lower extremities. Good ROM, no contractures. Normal muscle tone.  Skin: no rashes, lesions, ulcers. No  induration Neurologic: CN 2-12 grossly intact. Sensation intact. Strength 5/5 in all 4.  Psychiatric: Normal judgment and insight. Alert and oriented x 3. Normal mood.   EKG: Ordered and pending.  Assessment/Plan Principal Problem:   Acute respiratory failure with hypoxia (HCC) Active Problems:   Thrombocytopenia (HCC)   Small cell carcinoma of lung, right (HCC)   SVC syndrome   Paroxysmal atrial fibrillation (HCC)   Cancer associated pain   Ana Curry is a 52 y.o. 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  Assessment and Plan: Small cell carcinoma of right lung with extension of right infrahilar, right mainstem bronchus, right upper lobe and thoracic inlet adenopathy: She has been started on chemotherapy and radiation during recent hospitalization.  Readmitted with failure to thrive picture related to her cancer.   -Bone biopsy 6/21 unfortunately has resulted with findings consistent with metastatic small cell carcinoma.  These results were discussed with patient on admission -Will need medical and radiation oncology follow-up in hospital for continued plan of care  Acute respiratory failure with hypoxia Bilateral pleural effusions COPD: Multifactorial due to pulmonary mass and suspected underlying COPD.  Moderate right pleural effusion noted, increased from prior imaging.  Thoracentesis was considered during recent admission but not enough fluid present to perform. -Continue DuoNebs and albuterol as needed -Continue supplemental O2 as needed -Reconsider therapeutic thoracentesis  Moderate pericardial effusion: Stable pericardial effusion noted on CT.  Thrombocytopenia: Likely  secondary to malignancy.  SVC syndrome: Secondary to cancer, stable findings on repeat CT imaging.  Has been undergoing radiation treatment.  Hyponatremia: Sodium 128.  S/p 1 L fluids given in the ED.  Recheck labs in AM.  Paroxysmal atrial fibrillation: Developed A-fib RVR on recent admission.  Currently stable.  Continue amiodarone.  Not on anticoagulation due to thrombocytopenia.  Cancer associated pain: Worsened pain throughout whole body.  Will start OxyContin 10 mg BID.  IV Dilaudid as needed.   DVT prophylaxis: SCDs Start: 03/06/23 1947 Code Status: Full code, discussed with patient on admission Family Communication: Father and stepmother at bedside Disposition Plan: Pending clinical progress Consults called: None Severity of Illness: The appropriate patient status for this patient is INPATIENT. Inpatient status is judged to be reasonable and necessary in order to provide the required intensity of service to ensure the patient's safety. The patient's presenting symptoms, physical exam findings, and initial radiographic and laboratory data in the context of their chronic comorbidities is felt to place them at high risk  for further clinical deterioration. Furthermore, it is not anticipated that the patient will be medically stable for discharge from the hospital within 2 midnights of admission.   * I certify that at the point of admission it is my clinical judgment that the patient will require inpatient hospital care spanning beyond 2 midnights from the point of admission due to high intensity of service, high risk for further deterioration and high frequency of surveillance required.Darreld Mclean MD [facility]  If 7PM-7AM, please contact night-coverage [email or web address]  03/06/2023, 8:41 PM

## 2023-03-06 NOTE — Progress Notes (Signed)
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 female is seen in the clinic today.  She was just discharged from the hospital yesterday.  She had received cycle 1 day 1 of chemotherapy starting last Friday, completed chemotherapy on Sunday I saw her daily over the weekend She was discharged yesterday after completion of radiation treatment She returns today to continue on radiation therapy along with immunotherapy Unfortunately, the radiation machine was not working and she saw me earlier today. Her vital signs are unstable She had a heart rate of 117 with low blood pressure and she felt unwell.  She is here with her father She has oxygen delivery via nasal cannula but yet she struggled to breathe Since discharge from the hospital, she had difficulties with fever overnight but did not take her temperature She had poor appetite and struggle to eat No nausea vomiting She have diffuse bone pain throughout her body despite taking pain medicine She looks ill  I reviewed the patient's records extensive and collaborated the history with the patient. Summary of her history is as follows: Oncology History  Small cell carcinoma of lung, right (HCC)  02/26/2023 Cancer Staging   Staging form: Lung, AJCC 8th Edition - Clinical stage from 02/26/2023: Stage IVA (cT2, cN3, cM1b) - Signed by Malachy Mood, MD on 02/28/2023   02/28/2023 Initial Diagnosis   Small cell carcinoma of lung, right (HCC)   03/02/2023 -  Chemotherapy   Patient is on Treatment Plan : LUNG SMALL CELL EXTENSIVE STAGE Durvalumab + Carboplatin D1 + Etoposide D1-3 q21d x 4 Cycles / Durvalumab q28d       MEDICAL HISTORY:  Past Medical History:  Diagnosis Date   Hidradenitis suppurativa     History of abnormal cervical Pap smear    approx 2003  s/p LEEP   VIN III (vulvar intraepithelial neoplasia III)    Wears glasses     SURGICAL HISTORY: Past Surgical History:  Procedure Laterality Date   IR IMAGING GUIDED PORT INSERTION  03/02/2023   NO PAST SURGERIES     VULVA Ples Specter BIOPSY N/A 07/12/2022   Procedure: VULVAR BIOPSY;  Surgeon: Carver Fila, MD;  Location: [facility];  Service: Gynecology;  Laterality: N/A;   VULVECTOMY PARTIAL N/A 07/12/2022   Procedure: WIDE EXCISION VULVA;  Surgeon: Carver Fila, MD;  Location: [facility];  Service: Gynecology;  Laterality: N/A;    SOCIAL HISTORY: Social History   Socioeconomic History   Marital status: Single    Spouse name: Not on file   Number of children: 0   Years of education: Not on file   Highest education level: High school graduate  Occupational History   Not on file  Tobacco Use   Smoking status: Every Day    Packs/day: 1.5    Types: Cigarettes    Start date: 35   Smokeless tobacco: Never  Vaping Use   Vaping Use: Never used  Substance and Sexual Activity   Alcohol use: Not Currently   Drug use: Never   Sexual activity: Not Currently    Birth control/protection: None  Other Topics Concern   Not on file  Social History Narrative   Not on file   Social Determinants of Health   Financial  Resource Strain: Not on file  Food Insecurity: No Food Insecurity (03/04/2023)   Hunger Vital Sign    Worried About Running Out of Food in the Last Year: Never true    Ran Out of Food in the Last Year: Never true  Transportation Needs: No Transportation Needs (03/04/2023)   PRAPARE - Administrator, Civil Service (Medical): No    Lack of Transportation (Non-Medical): No  Physical Activity: Not on file  Stress: Not on file  Social Connections: Not on file  Intimate Partner Violence: Not on file    FAMILY HISTORY: Family History  Problem Relation Age  of Onset   Non-Hodgkin's lymphoma Mother 33   Colon cancer Neg Hx    Breast cancer Neg Hx    Ovarian cancer Neg Hx    Endometrial cancer Neg Hx    Pancreatic cancer Neg Hx    Prostate cancer Neg Hx     ALLERGIES:  is allergic to codeine.  MEDICATIONS:  Current Outpatient Medications  Medication Sig Dispense Refill   acetaminophen (TYLENOL) 500 MG tablet Take 1,000 mg by mouth every 6 (six) hours as needed.     amiodarone (PACERONE) 200 MG tablet Take 1 tablet (200 mg total) by mouth daily. 30 tablet 0   clotrimazole-betamethasone (LOTRISONE) cream Apply 1 Application topically daily. To feet.     dextromethorphan-guaiFENesin (MUCINEX DM) 30-600 MG 12hr tablet Take 1 tablet by mouth 2 (two) times daily as needed for cough.     diazepam (VALIUM) 10 MG tablet Take 10 mg by mouth daily as needed for anxiety.     dicyclomine (BENTYL) 20 MG tablet Take 20 mg by mouth daily.     ipratropium-albuterol (DUONEB) 0.5-2.5 (3) MG/3ML SOLN Take 3 mLs by nebulization every 4 (four) hours as needed. 360 mL 1   nicotine (NICODERM CQ - DOSED IN MG/24 HOURS) 21 mg/24hr patch Place 1 patch (21 mg total) onto the skin daily. 28 patch 0   polyethylene glycol (MIRALAX / GLYCOLAX) 17 g packet Take 17 g by mouth daily. 14 each 0   senna-docusate (SENOKOT-S) 8.6-50 MG tablet Take 1 tablet by mouth at bedtime as needed for mild constipation. 30 tablet 0   triamcinolone ointment (KENALOG) 0.1 % Apply 1 Application topically daily as needed (Ear rash).     VENTOLIN HFA 108 (90 Base) MCG/ACT inhaler Inhale 2 puffs into the lungs every 6 (six) hours as needed for wheezing or shortness of breath. 6.7 g 1   No current facility-administered medications for this visit.    REVIEW OF SYSTEMS:   All other systems were reviewed with the patient and are negative.  PHYSICAL EXAMINATION: ECOG PERFORMANCE STATUS: 3 - Symptomatic, >50% confined to bed  Vitals:   03/06/23 1253  BP: (!) 98/57  Pulse: (!) 117  Resp: 20   Temp: (!) 97.5 F (36.4 C)  SpO2: 93%   There were no vitals filed for this visit.  GENERAL:alert, in moderate distress from pain and appears to have respiratory distress.  She has oxygen delivery Nasal cannula SKIN: She looks pale EYES: normal, conjunctiva are pink and non-injected, sclera clear OROPHARYNX: She has dry mucous membrane NECK: supple, thyroid normal size, non-tender, without nodularity LYMPH:  no palpable lymphadenopathy in the cervical, axillary or inguinal LUNGS: She has good breath sound bilaterally but has respiratory distress with increased breathing effort HEART: tachycardia, no murmurs without lower extremity edema ABDOMEN:abdomen soft, non-tender and normal bowel sounds Musculoskeletal:no cyanosis of digits and no  clubbing  PSYCH: She is ill-appearing NEURO: no focal motor/sensory deficits  LABORATORY DATA:  I have reviewed the data as listed Lab Results  Component Value Date   WBC 6.8 03/05/2023   HGB 10.1 (L) 03/05/2023   HCT 30.6 (L) 03/05/2023   MCV 97.5 03/05/2023   PLT 33 (L) 03/05/2023   Recent Labs    03/02/23 1118 03/03/23 0508 03/04/23 0500 03/05/23 0822  NA 134* 132* 133* 133*  K 3.5 4.3 3.9 3.6  CL 100 104 102 101  CO2 23 21* 20* 24  GLUCOSE 91 156* 139* 113*  BUN 17 20 16 15   CREATININE 0.96 0.79 0.90 0.83  CALCIUM 9.5 9.1 8.8* 9.4  GFRNONAA >60 >60 >60 >60  PROT 5.9*  --  5.4* 5.9*  ALBUMIN 2.9*  --  2.6* 2.8*  AST 30  --  33 39  ALT 15  --  20 26  ALKPHOS 106  --  81 86  BILITOT 0.6  --  0.5 0.5    RADIOGRAPHIC STUDIES: I have personally reviewed the radiological images as listed and agreed with the findings in the report. IR IMAGING GUIDED PORT INSERTION  Result Date: 03/02/2023 INDICATION: History of metastatic lung cancer. In need of durable intravenous access for the initiation of chemotherapy. EXAM: IMPLANTED PORT A CATH PLACEMENT WITH ULTRASOUND AND FLUOROSCOPIC GUIDANCE COMPARISON:  Neck CT-02/23/2023 MEDICATIONS:  None ANESTHESIA/SEDATION: Moderate (conscious) sedation was employed during this procedure as administered by the Interventional Radiology RN. A total of Benadryl 25 mg and Fentanyl 25 mcg was administered intravenously. Moderate Sedation Time: 23 minutes. The patient's level of consciousness and vital signs were monitored continuously by radiology nursing throughout the procedure under my direct supervision. CONTRAST:  None FLUOROSCOPY TIME:  24 seconds (4 mGy) COMPLICATIONS: None immediate. PROCEDURE: The procedure, risks, benefits, and alternatives were explained to the patient. Questions regarding the procedure were encouraged and answered. The patient understands and consents to the procedure. Given presence of known right supraclavicular bulky lymphadenopathy, the decision was made to proceed with left internal jugular approach port a catheter placement. As such, the left neck and chest were prepped with chlorhexidine in a sterile fashion, and a sterile drape was applied covering the operative field. Maximum barrier sterile technique with sterile gowns and gloves were used for the procedure. A timeout was performed prior to the initiation of the procedure. Local anesthesia was provided with 1% lidocaine with epinephrine. After creating a small venotomy incision, a micropuncture kit was utilized to access the internal jugular vein. Real-time ultrasound guidance was utilized for vascular access including the acquisition of a permanent ultrasound image documenting patency of the accessed vessel. The microwire was utilized to measure appropriate catheter length. A subcutaneous port pocket was then created along the upper chest wall utilizing a combination of sharp and blunt dissection. The pocket was irrigated with sterile saline. A single lumen clear view power injectable port was chosen for placement. The 8 Fr catheter was tunneled from the port pocket site to the venotomy incision. The port was placed in the  pocket. The external catheter was trimmed to appropriate length. At the venotomy, an 8 Fr peel-away sheath was placed over a guidewire under fluoroscopic guidance. The catheter was then placed through the sheath and the sheath was removed. Final catheter positioning was confirmed and documented with a fluoroscopic spot radiograph. The port was accessed with a Huber needle, aspirated and flushed with heparinized saline. The venotomy site was closed with an interrupted 4-0 Vicryl  suture. The port pocket incision was closed with interrupted 2-0 Vicryl suture. Dermabond and Steri-strips were applied to both incisions. Dressings were applied. The patient tolerated the procedure well without immediate post procedural complication. FINDINGS: After catheter placement, the tip lies within the superior cavoatrial junction. The catheter aspirates and flushes normally and is ready for immediate use. IMPRESSION: Successful placement of a left internal jugular approach power injectable Port-A-Cath. The catheter is ready for immediate use. Electronically Signed   By: Simonne Come M.D.   On: 03/02/2023 15:05   CT BONE MARROW BIOPSY & ASPIRATION  Result Date: 03/02/2023 INDICATION: History of metastatic lung cancer, now with thrombocytopenia. Please perform CT-guided bone marrow biopsy for tissue diagnostic purposes. EXAM: CT-GUIDED BONE MARROW BIOPSY AND ASPIRATION MEDICATIONS: None ANESTHESIA/SEDATION: Moderate (conscious) sedation was employed during this procedure as administered by the Interventional Radiology RN. A total of Benadryl 25 mg, Versed 1 mg and Fentanyl 50 mcg was administered intravenously. Moderate Sedation Time: 10 minutes. The patient's level of consciousness and vital signs were monitored continuously by radiology nursing throughout the procedure under my direct supervision. COMPLICATIONS: None immediate. PROCEDURE: Informed consent was obtained from the patient following an explanation of the procedure,  risks, benefits and alternatives. The patient understands, agrees and consents for the procedure. All questions were addressed. A time out was performed prior to the initiation of the procedure. The patient was positioned prone and non-contrast localization CT was performed of the pelvis to demonstrate the iliac marrow spaces. The operative site was prepped and draped in the usual sterile fashion. Under sterile conditions and local anesthesia, a 22 gauge spinal needle was utilized for procedural planning. Next, an 11 gauge coaxial bone biopsy needle was advanced into the left iliac marrow space. Needle position was confirmed with CT imaging. Initially, a bone marrow aspiration was performed. Next, a bone marrow biopsy was obtained with the 11 gauge outer bone marrow device. The needle was removed and superficial hemostasis was obtained with manual compression. A dressing was applied. The patient tolerated the procedure well without immediate post procedural complication. IMPRESSION: Successful CT guided left iliac bone marrow aspiration and core biopsy. Electronically Signed   By: Simonne Come M.D.   On: 03/02/2023 15:03   IR US CHEST  Result Date: 02/28/2023 CLINICAL DATA:  Evaluate for thoracentesis. EXAM: CHEST ULTRASOUND COMPARISON:  Chest radiograph 02/28/2023 FINDINGS: Small amount of right pleural fluid was identified. No percutaneous window for thoracentesis due to a flap of lung. Thoracentesis not performed. IMPRESSION: Small right pleural effusion. Thoracentesis not performed due to lack of a safe percutaneous window. Electronically Signed   By: Richarda Overlie M.D.   On: 02/28/2023 16:10   NM Bone Scan Whole Body  Result Date: 02/28/2023 CLINICAL DATA:  Lung cancer EXAM: NUCLEAR MEDICINE WHOLE BODY BONE SCAN TECHNIQUE: Whole body anterior and posterior images were obtained approximately 3 hours after intravenous injection of radiopharmaceutical. RADIOPHARMACEUTICALS:  21.0 mCi Technetium-62m MDP IV  COMPARISON:  CT scan earlier June 2024 of multiple areas. FINDINGS: Physiologic distribution of radiotracer overall. There is some uptake involving the right midfoot consistent with known provided history of injury in the past. There is slight asymmetric uptake along the proximal left tibia from the other side. There is also some subtle areas of low uptake along the distal femoral metaphysis. Recommend dedicated x-rays. Mild areas of degenerative uptake such as along the extremities. Of note the kidneys are not as well seen as typical. Please correlate with clinical findings. IMPRESSION: Subtle asymmetric  uptake involving the proximal left tibia. Slight low uptake along the distal femurs. Recommend dedicated x-rays if there is no known history. Atypical low uptake of the renal parenchyma. The rest of the findings do not suggest super scan but please correlate with the patient's renal function and other history. This may be technical. Electronically Signed   By: Karen Kays M.D.   On: 02/28/2023 14:40   DG CHEST PORT 1 VIEW  Result Date: 02/28/2023 CLINICAL DATA:  Pleural effusion on right. EXAM: PORTABLE CHEST 1 VIEW COMPARISON:  Radiographs 02/27/2023 and 02/23/2023. Abdominal CT 02/27/2023 and chest CT 02/23/2023. FINDINGS: 0845 hours. Mild patient rotation to the right. Grossly stable heart size and mediastinal contours with known extensive mediastinal lymphadenopathy. There are enlarging right greater than left pleural effusions with increasing atelectasis at both lung bases. No pneumothorax. The bones appear unchanged. Telemetry leads overlie the chest. IMPRESSION: 1. Enlarging right greater than left pleural effusions with increasing bibasilar atelectasis. 2. Grossly stable mediastinal lymphadenopathy from recent chest CT, presumed lung cancer. Electronically Signed   By: Carey Bullocks M.D.   On: 02/28/2023 12:55   MR BRAIN W WO CONTRAST  Result Date: 02/28/2023 CLINICAL DATA:  Staging of small cell  lung carcinoma EXAM: MRI HEAD WITHOUT AND WITH CONTRAST TECHNIQUE: Multiplanar, multiecho pulse sequences of the brain and surrounding structures were obtained without and with intravenous contrast. CONTRAST:  7mL GADAVIST GADOBUTROL 1 MMOL/ML IV SOLN COMPARISON:  None Available. FINDINGS: Brain: No acute infarct, mass effect or extra-axial collection. No acute or chronic hemorrhage. Normal white matter signal. On diffusion-weighted imaging and the FLAIR sequence, there are bilateral convexity signal abnormalities within the subdural space. The midline structures are normal. There is smooth, diffuse pachymeningeal contrast enhancement. There are no intraparenchymal contrast enhancing lesions. Vascular: Major flow voids are preserved. Skull and upper cervical spine: Normal calvarium and skull base. Visualized upper cervical spine and soft tissues are normal. Sinuses/Orbits:Mastoid effusions. Paranasal sinuses are clear. Normal orbits. IMPRESSION: 1. No intraparenchymal metastatic disease. 2. Bilateral convexity signal abnormalities within the subdural space, which may indicate aging subdural blood. 3. Smooth, diffuse pachymeningeal contrast enhancement. 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 Metastatic disease felt less likely given the lack nodularity. Electronically Signed   By: Deatra Robinson M.D.   On: 02/28/2023 02:17   DG CHEST PORT 1 VIEW  Result Date: 02/27/2023 CLINICAL DATA:  Shortness of breath. EXAM: PORTABLE CHEST 1 VIEW COMPARISON:  February 23, 2023. FINDINGS: Stable cardiomegaly. Mild central pulmonary vascular congestion is noted. Minimal bibasilar pulmonary edema is noted with small right pleural effusion. Bony thorax is unremarkable. IMPRESSION: Stable cardiomegaly with mild central pulmonary vascular congestion. Minimal bibasilar pulmonary edema is noted with probable small right  pleural effusion. Electronically Signed   By: Lupita Raider M.D.   On: 02/27/2023 16:27   CT ABDOMEN PELVIS W CONTRAST  Result Date: 02/27/2023 CLINICAL DATA:  Non-small-cell lung cancer. Staging. * Tracking Code: BO * EXAM: CT ABDOMEN AND PELVIS WITH CONTRAST TECHNIQUE: Multidetector CT imaging of the abdomen and pelvis was performed using the standard protocol following bolus administration of intravenous contrast. RADIATION DOSE REDUCTION: This exam was performed according to the departmental dose-optimization program which includes automated exposure control, adjustment of the mA and/or kV according to patient size and/or use of iterative reconstruction technique. CONTRAST:  75mL OMNIPAQUE IOHEXOL 350 MG/ML SOLN COMPARISON:  Chest CTA 02/23/2023 FINDINGS:  Lower chest: Bibasilar collapse/consolidation with bilateral pleural effusions, right greater than left. Pleural effusions are progressive since the chest CT 4 days ago. Hepatobiliary: Small area of low attenuation in the anterior liver, adjacent to the falciform ligament, is in a characteristic location for focal fatty deposition. 9 mm hypodensity in the left liver (15/3) is too small to characterize but statistically is likely benign. There is no evidence for gallstones, gallbladder wall thickening, or pericholecystic fluid. No intrahepatic or extrahepatic biliary dilation. Pancreas: No focal mass lesion. No dilatation of the main duct. No intraparenchymal cyst. No peripancreatic edema. Spleen: No splenomegaly. No suspicious focal mass lesion. Adrenals/Urinary Tract: No adrenal nodule or mass. Kidneys unremarkable. No evidence for hydroureter. The urinary bladder appears normal for the degree of distention. Stomach/Bowel: Stomach is distended with food and fluid. Duodenum is normally positioned as is the ligament of Treitz. No small bowel wall thickening. No small bowel dilatation. The terminal ileum is normal. The appendix is normal. No gross colonic  mass. No colonic wall thickening. Vascular/Lymphatic: Moderate atherosclerotic calcification is noted in the wall of the aorta. There is no gastrohepatic or hepatoduodenal ligament lymphadenopathy. No retroperitoneal or mesenteric lymphadenopathy. No pelvic sidewall lymphadenopathy. Reproductive: Unremarkable. Other: No intraperitoneal free fluid. Musculoskeletal: No worrisome lytic or sclerotic osseous abnormality. IMPRESSION: 1. No definite evidence for metastatic disease in the abdomen or pelvis. 2. 9 mm hypodensity in the left liver is too small to characterize but statistically is likely benign. Attention on follow-up recommended. 3. Bibasilar collapse/consolidation with bilateral pleural effusions, right greater than left. Pleural effusions are progressive since the chest CT 4 days ago. 4.  Aortic Atherosclerosis (ICD10-I70.0). Electronically Signed   By: Kennith Center M.D.   On: 02/27/2023 11:47   [place] CORE BIOPSY (SOFT TISSUE)  Result Date: 02/26/2023 INDICATION: Cervical and supraclavicular adenopathy.  No diagnosis. EXAM: ULTRASOUND-GUIDED RIGHT SUPRACLAVICULAR NODAL MASS BIOPSY COMPARISON:  CT neck and chest, 09/24/2022. MEDICATIONS: None ANESTHESIA/SEDATION: Local anesthetic was administered. COMPLICATIONS: None immediate. TECHNIQUE: Informed written consent was obtained from the patient and/or patient's representative after a discussion of the risks, benefits and alternatives to treatment. Questions regarding the procedure were encouraged and answered. Initial ultrasound scanning demonstrated RIGHT supraclavicular nodal mass. An ultrasound image was saved for documentation purposes. The procedure was planned. A timeout was performed prior to the initiation of the procedure. The operative was prepped and draped in the usual sterile fashion, and a sterile drape was applied covering the operative field. A timeout was performed prior to the initiation of the procedure. Local anesthesia was provided with  1% lidocaine with epinephrine. Under direct ultrasound guidance, an 18 gauge core needle device was utilized to obtain to obtain 3 core needle biopsies of the RIGHT supraclavicular nodal mass. The samples were placed in saline and submitted to pathology. The needle was removed and superficial hemostasis was achieved with manual compression. Post procedure scan was negative for significant hematoma. A dressing was applied. The patient tolerated the procedure well without immediate postprocedural complication. IMPRESSION: Successful core biopsy of RIGHT supraclavicular nodal mass. Roanna Banning, MD [facility] Radiology Electronically Signed   By: Roanna Banning M.D.   On: 02/26/2023 10:15   ECHOCARDIOGRAM COMPLETE  Result Date: 02/24/2023    ECHOCARDIOGRAM REPORT   Patient Name:   Ana Curry Date of Exam: 02/24/2023 Medical Rec #:  [id number]         Height:       68.0 in Accession #:    [id number]  Weight:       157.0 lb Date of Birth:  1970-11-21        BSA:          1.844 m Patient Age:    51 years          BP:           111/73 mmHg Patient Gender: F                 HR:           88 bpm. Exam Location:  Inpatient Procedure: 2D Echo, Cardiac Doppler and Color Doppler Indications:    A-FIB  History:        Patient has no prior history of Echocardiogram examinations.                 Arrythmias:Atrial Fibrillation; Risk Factors:Current Smoker.  Sonographer:    Darlys Gales Referring Phys: [phone] JACOB J STINSON IMPRESSIONS  1. Left ventricular ejection fraction, by estimation, is 60 to 65%. The left ventricle has normal function. The left ventricle has no regional wall motion abnormalities. Left ventricular diastolic parameters are indeterminate.  2. Right ventricular systolic function is normal. The right ventricular size is normal. Tricuspid regurgitation signal is inadequate for assessing PA pressure.  3. The mitral valve is normal in structure. No evidence  of mitral valve regurgitation.  4. The aortic valve is grossly normal. Aortic valve regurgitation is not visualized.  5. The inferior vena cava is dilated in size with <50% respiratory variability, suggesting right atrial pressure of 15 mmHg. FINDINGS  Left Ventricle: Left ventricular ejection fraction, by estimation, is 60 to 65%. The left ventricle has normal function. The left ventricle has no regional wall motion abnormalities. The left ventricular internal cavity size was normal in size. There is  no left ventricular hypertrophy. Left ventricular diastolic parameters are indeterminate. Right Ventricle: The right ventricular size is normal. Right ventricular systolic function is normal. Tricuspid regurgitation signal is inadequate for assessing PA pressure. Left Atrium: Left atrial size was normal in size. Right Atrium: Right atrial size was normal in size. Pericardium: There is no evidence of pericardial effusion. Mitral Valve: The mitral valve is normal in structure. No evidence of mitral valve regurgitation. Tricuspid Valve: Tricuspid valve regurgitation is not demonstrated. Aortic Valve: The aortic valve is grossly normal. Aortic valve regurgitation is not visualized. Aortic valve mean gradient measures 5.0 mmHg. Aortic valve peak gradient measures 7.6 mmHg. Aortic valve area, by VTI measures 2.82 cm. Pulmonic Valve: Pulmonic valve regurgitation is not visualized. Aorta: The aortic root and ascending aorta are structurally normal, with no evidence of dilitation. Venous: The inferior vena cava is dilated in size with less than 50% respiratory variability, suggesting right atrial pressure of 15 mmHg. IAS/Shunts: The interatrial septum was not well visualized.  LEFT VENTRICLE PLAX 2D LVIDd:         5.10 cm   Diastology LVIDs:         3.00 cm   LV e' medial:    7.07 cm/s LV PW:         0.80 cm   LV E/e' medial:  12.3 LV IVS:        0.70 cm   LV e' lateral:   10.20 cm/s LVOT diam:     1.80 cm   LV E/e' lateral:  8.5 LV SV:         76 LV SV Index:   41 LVOT Area:  2.54 cm  RIGHT VENTRICLE             IVC RV S prime:     15.10 cm/s  IVC diam: 2.50 cm TAPSE (M-mode): 2.5 cm LEFT ATRIUM             Index        RIGHT ATRIUM           Index LA Vol (A2C):   21.7 ml 11.77 ml/m  RA Area:     14.00 cm LA Vol (A4C):   27.3 ml 14.81 ml/m  RA Volume:   34.60 ml  18.76 ml/m LA Biplane Vol: 24.7 ml 13.40 ml/m  AORTIC VALVE AV Area (Vmax):    2.42 cm AV Area (Vmean):   2.26 cm AV Area (VTI):     2.82 cm AV Vmax:           138.00 cm/s AV Vmean:          102.000 cm/s AV VTI:            0.269 m AV Peak Grad:      7.6 mmHg AV Mean Grad:      5.0 mmHg LVOT Vmax:         131.00 cm/s LVOT Vmean:        90.500 cm/s LVOT VTI:          0.298 m LVOT/AV VTI ratio: 1.11  AORTA Ao Root diam: 3.20 cm Ao Asc diam:  3.50 cm MITRAL VALVE MV Area (PHT): 5.23 cm     SHUNTS MV Decel Time: 145 msec     Systemic VTI:  0.30 m MV E velocity: 87.00 cm/s   Systemic Diam: 1.80 cm MV A velocity: 102.00 cm/s MV E/A ratio:  0.85 Photographer signed by Carolan Clines Signature Date/Time: 02/24/2023/3:44:27 PM    Final    CT Angio Chest PE W and/or Wo Contrast  Result Date: 02/23/2023 CLINICAL DATA:  Shortness of breath, right neck mass, chest mass, headache * Tracking Code: BO * EXAM: CT ANGIOGRAPHY CHEST WITH CONTRAST TECHNIQUE: Multidetector CT imaging of the chest was performed using the standard protocol during bolus administration of intravenous contrast. Multiplanar CT image reconstructions and MIPs were obtained to evaluate the vascular anatomy. RADIATION DOSE REDUCTION: This exam was performed according to the departmental dose-optimization program which includes automated exposure control, adjustment of the mA and/or kV according to patient size and/or use of iterative reconstruction technique. CONTRAST:  75mL OMNIPAQUE IOHEXOL 350 MG/ML SOLN COMPARISON:  None Available. FINDINGS: Cardiovascular: No filling defect is identified in the  pulmonary arterial tree to suggest pulmonary embolus. Mild atheromatous vascular calcification of the aortic arch. The large mediastinal mass bulges into the SVC posteriorly for example on image 128 series 4, probably from extrinsic compression, definite invasion is not observed. Because of lack of complete opacification from pulmonary arterial timing, patency of the left brachiocephalic vein is uncertain. Moderate pericardial effusion. Mediastinum/Nodes: Large right infrahilar and hilar mass extending into the mediastinum and tracking all the way to the thoracic inlet. This extends around the right mainstem bronchus, right upper lobe bronchus, and bronchus intermedius as well as the right lower lobe and right middle lobe bronchi, and likewise surrounds and narrows the right pulmonary artery and its branches. In the subcarinal region, the conglomerate mass measures about 7.9 by 5.6 cm (image 144, series 4) and the mass flattens and narrows the SVC without overt occlusion. In the right infrahilar region, the observed mass  measures 4.7 by 3.9 cm on image 179 series 4. Presumed lymph node anterior to the SVC in the upper chest measures 2.6 cm in short axis on image 36 series 3. A right lower neck level IV mass/lymph node measures 3.4 cm in short axis on image 10 series 3. Lungs/Pleura: The mass in the right chest displaces the upper trachea to the left. There is substantial narrowing of the right upper lobe bronchus, right middle lobe bronchus, and severe narrowing of the right lower lobe bronchus due to the surrounding mass. Patchy regions of nodularity in the right lower lobe distal to the mass may represent postobstructive pneumonitis or satellite tumor nodules. Centrilobular emphysema. Scarring or atelectasis in the right middle lobe and right upper lobe anteriorly. Mild dependent atelectasis in both lower lobes. Trace right pleural effusion, image 50 series 3. Upper Abdomen: Small nonspecific hypodense lesion in  the left hepatic lobe 0.9 cm in long axis on image 96 series 3. Musculoskeletal: Unremarkable Review of the MIP images confirms the above findings. IMPRESSION: 1. Large right infrahilar and hilar mass extending into the mediastinum and tracking all the way to the thoracic inlet. This extends around the right mainstem bronchus, right upper lobe bronchus, and bronchus intermedius as well as the right lower lobe and right middle lobe bronchi, and likewise surrounds and narrows the right pulmonary artery and its branches. The mass flattens and narrows the SVC without overt occlusion, and is confluent with bulky right level IV neck adenopathy. Top differential diagnostic considerations include lung cancer (such as squamous cell or small cell) versus extensive thoracic and right lower neck lymphoma. 2. Patchy regions of nodularity in the right lower lobe distal to the mass may represent postobstructive pneumonitis or satellite tumor nodules. 3. Presumed lymph node anterior to the SVC in the upper chest measures 2.6 cm in short axis. 4. A right lower neck level IV mass/lymph node measures 3.4 cm in short axis, compatible with malignancy. 5. Moderate pericardial effusion. 6. Trace right pleural effusion. 7. Centrilobular emphysema. 8. Small nonspecific hypodense lesion in the left hepatic lobe 0.9 cm in long axis. 9. No pulmonary embolus is identified. 10. Aortic atherosclerosis. Aortic Atherosclerosis (ICD10-I70.0) and Emphysema (ICD10-J43.9). Electronically Signed   By: Gaylyn Rong M.D.   On: 02/23/2023 13:39   CT Soft Tissue Neck W Contrast  Result Date: 02/23/2023 CLINICAL DATA:  Neck mass, nonpulsatile right sided neck mass EXAM: CT NECK WITH CONTRAST TECHNIQUE: Multidetector CT imaging of the neck was performed using the standard protocol following the bolus administration of intravenous contrast. RADIATION DOSE REDUCTION: This exam was performed according to the departmental dose-optimization program which  includes automated exposure control, adjustment of the mA and/or kV according to patient size and/or use of iterative reconstruction technique. CONTRAST:  75mL OMNIPAQUE IOHEXOL 350 MG/ML SOLN COMPARISON:  None Available. FINDINGS: Pharynx and larynx: Normal. No mass or swelling. Salivary glands: No inflammation, mass, or stone. Thyroid: Normal. Lymph nodes: There is bulky lower cervical and mediastinal lymphadenopathy involving the right level 3 and level 4 lymph node stations, the right supraclavicular region, and the anterior and posterior mediastinum. Cervical lymphadenopathy significant mass effect on the right internal jugular vein (series 5, image 68). Vascular: Negative. Mass effect on the right internal jugular vein, as above Limited intracranial: Negative. Visualized orbits: Negative. Mastoids and visualized paranasal sinuses: Moderate left-sided mastoid effusion. There is pneumatization of the petrous apices with air-fluid levels. Paranasal sinuses are clear. Orbits are unremarkable. Skeleton: No acute or aggressive process.  Upper chest: Mild paraseptal emphysema. See same day CT chest for additional findings Other: None. IMPRESSION: 1. Bulky right lower cervical and mediastinal lymphadenopathy, concerning for metastatic disease or lymphoma. 2. See separate CT chest for additional findings. Emphysema (ICD10-J43.9). Electronically Signed   By: Lorenza Cambridge M.D.   On: 02/23/2023 13:17   CT Head Wo Contrast  Result Date: 02/23/2023 CLINICAL DATA:  Headache EXAM: CT HEAD WITHOUT CONTRAST TECHNIQUE: Contiguous axial images were obtained from the base of the skull through the vertex without intravenous contrast. RADIATION DOSE REDUCTION: This exam was performed according to the departmental dose-optimization program which includes automated exposure control, adjustment of the mA and/or kV according to patient size and/or use of iterative reconstruction technique. COMPARISON:  None Available. FINDINGS:  Brain: There is no acute intracranial hemorrhage, extra-axial fluid collection, or acute infarct Parenchymal volume is normal. The ventricles are normal in size. Gray-white differentiation is preserved The pituitary and suprasellar region are normal. There is no mass lesion. There is no mass effect or midline shift. Vascular: No hyperdense vessel or unexpected calcification. Skull: Normal. Negative for fracture or focal lesion. Sinuses/Orbits: The imaged paranasal sinuses are clear. The globes and orbits are unremarkable. Other: There are small bilateral mastoid effusions. IMPRESSION: 1. No acute intracranial pathology. 2. Small bilateral mastoid effusions. Electronically Signed   By: Lesia Hausen M.D.   On: 02/23/2023 13:15   DG Chest Port 1 View  Result Date: 02/23/2023 CLINICAL DATA:  Sepsis EXAM: PORTABLE CHEST 1 VIEW COMPARISON:  X-ray 01/09/2023 FINDINGS: No pneumothorax, effusion or edema. Calcified aorta. Overlapping cardiac leads. There is new widening of the mediastinum compared to prior x-ray and fullness of the right lung hilum. Recommend further evaluation with CT to delineate for potential mass. IMPRESSION: New widening of the mediastinum with masslike fullness in the right lung hilum. Recommend follow up contrast CT to further delineate Electronically Signed   By: Karen Kays M.D.   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   All questions were answered. The patient knows to call the clinic with any problems, questions or concerns. The total time spent in the appointment was 40 minutes encounter with patients including review of chart and various tests results, discussions about plan of care and coordination of care plan   Artis Delay, MD 03/06/2023 1:09 PM

## 2023-03-07 ENCOUNTER — Ambulatory Visit: Payer: No Typology Code available for payment source

## 2023-03-07 ENCOUNTER — Other Ambulatory Visit: Payer: Self-pay

## 2023-03-07 ENCOUNTER — Ambulatory Visit: Payer: Medicaid Other

## 2023-03-07 DIAGNOSIS — J9601 Acute respiratory failure with hypoxia: Secondary | ICD-10-CM

## 2023-03-07 DIAGNOSIS — C3491 Malignant neoplasm of unspecified part of right bronchus or lung: Secondary | ICD-10-CM | POA: Diagnosis not present

## 2023-03-07 DIAGNOSIS — R0902 Hypoxemia: Secondary | ICD-10-CM

## 2023-03-07 DIAGNOSIS — R531 Weakness: Principal | ICD-10-CM

## 2023-03-07 DIAGNOSIS — J90 Pleural effusion, not elsewhere classified: Secondary | ICD-10-CM

## 2023-03-07 LAB — CBC
HCT: 28.2 % — ABNORMAL LOW (ref 36.0–46.0)
Hemoglobin: 9.3 g/dL — ABNORMAL LOW (ref 12.0–15.0)
MCH: 31.8 pg (ref 26.0–34.0)
MCHC: 33 g/dL (ref 30.0–36.0)
MCV: 96.6 fL (ref 80.0–100.0)
Platelets: 31 10*3/uL — ABNORMAL LOW (ref 150–400)
RBC: 2.92 MIL/uL — ABNORMAL LOW (ref 3.87–5.11)
RDW: 14.6 % (ref 11.5–15.5)
WBC: 3.8 10*3/uL — ABNORMAL LOW (ref 4.0–10.5)
nRBC: 0 % (ref 0.0–0.2)

## 2023-03-07 LAB — COMPREHENSIVE METABOLIC PANEL
ALT: 24 U/L (ref 0–44)
AST: 45 U/L — ABNORMAL HIGH (ref 15–41)
Albumin: 2.6 g/dL — ABNORMAL LOW (ref 3.5–5.0)
Alkaline Phosphatase: 80 U/L (ref 38–126)
Anion gap: 10 (ref 5–15)
BUN: 19 mg/dL (ref 6–20)
CO2: 25 mmol/L (ref 22–32)
Calcium: 9.2 mg/dL (ref 8.9–10.3)
Chloride: 95 mmol/L — ABNORMAL LOW (ref 98–111)
Creatinine, Ser: 0.91 mg/dL (ref 0.44–1.00)
GFR, Estimated: >60 mL/min (ref >60)
Glucose, Bld: 118 mg/dL — ABNORMAL HIGH (ref 70–99)
Potassium: 3.6 mmol/L (ref 3.5–5.1)
Sodium: 130 mmol/L — ABNORMAL LOW (ref 135–145)
Total Bilirubin: 0.7 mg/dL (ref 0.3–1.2)
Total Protein: 5.6 g/dL — ABNORMAL LOW (ref 6.5–8.1)

## 2023-03-07 MED ORDER — POLYETHYLENE GLYCOL 3350 17 G PO PACK
17.0000 g | PACK | Freq: Two times a day (BID) | ORAL | Status: DC
  Administered 2023-03-07 – 2023-03-14 (×9): 17 g via ORAL
  Filled 2023-03-07 (×10): qty 1

## 2023-03-07 MED ORDER — ALBUTEROL SULFATE (2.5 MG/3ML) 0.083% IN NEBU
2.5000 mg | INHALATION_SOLUTION | Freq: Four times a day (QID) | RESPIRATORY_TRACT | Status: DC | PRN
  Administered 2023-03-11: 2.5 mg via RESPIRATORY_TRACT
  Filled 2023-03-07: qty 3

## 2023-03-07 MED ORDER — AMIODARONE HCL 200 MG PO TABS
200.0000 mg | ORAL_TABLET | Freq: Two times a day (BID) | ORAL | Status: DC
  Administered 2023-03-07 – 2023-03-15 (×17): 200 mg via ORAL
  Filled 2023-03-07 (×17): qty 1

## 2023-03-07 MED ORDER — CHLORHEXIDINE GLUCONATE CLOTH 2 % EX PADS
6.0000 | MEDICATED_PAD | Freq: Every day | CUTANEOUS | Status: DC
  Administered 2023-03-07 – 2023-03-14 (×8): 6 via TOPICAL

## 2023-03-07 MED ORDER — SODIUM CHLORIDE 0.9 % IV SOLN: INTRAVENOUS | Status: AC

## 2023-03-07 MED ORDER — GLYCERIN (LAXATIVE) 2.1 G RE SUPP: 1.0000 | Freq: Every day | RECTAL | Status: DC | PRN

## 2023-03-07 MED ORDER — SENNA 8.6 MG PO TABS
1.0000 | ORAL_TABLET | Freq: Every day | ORAL | Status: DC
  Administered 2023-03-07 – 2023-03-09 (×3): 8.6 mg via ORAL
  Filled 2023-03-07 (×3): qty 1

## 2023-03-07 MED ORDER — NICOTINE 14 MG/24HR TD PT24
14.0000 mg | MEDICATED_PATCH | Freq: Every day | TRANSDERMAL | Status: DC
  Administered 2023-03-07 – 2023-03-15 (×9): 14 mg via TRANSDERMAL
  Filled 2023-03-07 (×9): qty 1

## 2023-03-07 NOTE — ED Provider Notes (Signed)
  Physical Exam  BP (!) 96/56 (BP Location: Right Arm)   Pulse (!) 105   Temp 98.2 F (36.8 C) (Oral)   Resp (!) 24   Ht 5\' 8"  (1.727 m)   Wt 77.1 kg   LMP 01/22/2017   SpO2 95%   BMI 25.85 kg/m   Physical Exam Vitals and nursing note reviewed.  Constitutional:      General: She is not in acute distress.    Appearance: She is well-developed. She is ill-appearing.  HENT:     Head: Normocephalic and atraumatic.  Eyes:     Conjunctiva/sclera: Conjunctivae normal.  Cardiovascular:     Rate and Rhythm: Normal rate and regular rhythm.     Heart sounds: No murmur heard. Pulmonary:     Effort: Pulmonary effort is normal. No respiratory distress.     Breath sounds: Rales present.  Abdominal:     Palpations: Abdomen is soft.     Tenderness: There is no abdominal tenderness.  Musculoskeletal:        General: Tenderness present. No swelling.     Cervical back: Neck supple.  Skin:    General: Skin is warm and dry.     Capillary Refill: Capillary refill takes less than 2 seconds.  Neurological:     Mental Status: She is alert.  Psychiatric:        Mood and Affect: Mood normal.     Procedures  .Critical Care  Performed by: Glendora Score, MD Authorized by: Glendora Score, MD   Critical care provider statement:    Critical care time (minutes):  30   Critical care was necessary to treat or prevent imminent or life-threatening deterioration of the following conditions:  Respiratory failure   Critical care was time spent personally by me on the following activities:  Development of treatment plan with patient or surrogate, discussions with consultants, evaluation of patient's response to treatment, examination of patient, ordering and review of laboratory studies, ordering and review of radiographic studies, ordering and performing treatments and interventions, pulse oximetry, re-evaluation of patient's condition and review of old charts   ED Course / MDM    Medical Decision  Making Amount and/or Complexity of Data Reviewed Labs: ordered. Radiology: ordered.  Risk Prescription drug management. Decision regarding hospitalization.   Patient received in handoff.  Lung cancer with known SVC syndrome with progressive worsening back pain and profound weakness.  Pending CT imaging at time of signout.  CT imaging with no significant change in the size of the lymphadenopathy, stable extrinsic compression of the right IJ, will left brachycephalic vein and SVC which may contribute to SVC syndrome but grossly stable and increased moderate right and small left pleural effusions with increased compressive atelectasis in the right middle lobe.  On my reevaluation, patient is satting 87% on her home 3 L and is requiring up to 8 L nasal cannula to maintain appropriate oxygen saturations.  Patient require hospital admission for increased oxygen requirement and possible thoracentesis.       Glendora Score, MD 03/07/23 1228

## 2023-03-07 NOTE — Progress Notes (Signed)
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 She has started radiation on 03/02/23 She completed cycle 1 of chemotherapy on Sunday, continue supportive care Omitted immunotherapy due to hospitalization No signs of tumor lysis syndrome   Pain from procedure/lower back pain Could be an element of bone pain from cancer involvement of her bone marrow She will continue pain medications as needed  Opiate induced constipation I suspect an element of her pain could be related to constipation This is seen on her imaging study Recommend laxative therapy   Respiratory failure due to lung cancer Continue oxygen therapy I recommend attempt for thoracentesis   Acquired pancytopenia Possible bone marrow involvement Anticipate her platelet count will drop over the next few days Will check CBC daily and transfuse if signs of bleeding or platelet count less than 10,000   Low BP, hyponatremia, poor oral intake This has improved with IV fluids from prior hospitalization Recommend gentle IV fluid hydration with normal saline   Goals of care She will continue radiation therapy in the hospital I do not think it is realistic to expect her to be discharged home as the patient is not able to take care of herself even with family support and she is readmitted in less than 24 hours after discharge   Discharge planning Recommend consideration for skilled nursing facility as part of her discharge planning  All questions were answered. The patient knows to call the clinic with any problems, questions or concerns.   The total time spent in the appointment was 40 minutes encounter with patients including review of chart and various tests results, discussions about plan of care and coordination of care plan  Artis Delay, MD 03/07/2023 10:01 AM  Subjective:  The patient was directed to the emergency  room for admission yesterday She was discharged and readmitted in less than 24 hours due to diffuse pain, weakness, tachycardia, hypotension and respiratory failure I have reviewed her imaging studies She did not feel better since admission Her blood pressure has improved but she remained tachycardic She missed radiation yesterday due to the machine not working well She continues to have diffuse bone pain  Objective:  Vitals:   03/07/23 0536 03/07/23 0920  BP: 110/68 110/74  Pulse: (!) 115 (!) 117  Resp: 18 (!) 22  Temp: 97.7 F (36.5 C) 98.1 F (36.7 C)  SpO2: 92% 95%     Intake/Output Summary (Last 24 hours) at 03/07/2023 1001 Last data filed at 03/06/2023 2300 Gross per 24 hour  Intake 1240 ml  Output --  Net 1240 ml    GENERAL:alert, no distress and comfortable SKIN: Diffuse bruises NEURO: alert & oriented x 3 with fluent speech, no focal motor/sensory deficits   Labs:  Recent Labs    03/05/23 0822 03/06/23 1614 03/07/23 0525  NA 133* 128* 130*  K 3.6 3.6 3.6  CL 101 95* 95*  CO2 24 26 25   GLUCOSE 113* 97 118*  BUN 15 17 19   CREATININE 0.83 0.76 0.91  CALCIUM 9.4 8.9 9.2  GFRNONAA >60 >60 >60  PROT 5.9* 5.8* 5.6*  ALBUMIN 2.8* 2.8* 2.6*  AST 39 44* 45*  ALT 26 24 24   ALKPHOS 86 84 80  BILITOT 0.5 0.9 0.7    Studies: I have reviewed her imaging from yesterday CT Chest W Contrast  Result Date: 03/06/2023 CLINICAL DATA:  Small cell lung cancer. Increasing  back pain and extreme fatigue. 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 * Tracking Code: BO * EXAM: CT CHEST, ABDOMEN, AND PELVIS WITH CONTRAST TECHNIQUE: Multidetector CT imaging of the chest, abdomen and pelvis was performed following the standard protocol during bolus administration of intravenous contrast. RADIATION DOSE REDUCTION: This exam was performed according to the departmental dose-optimization program which includes automated exposure control, adjustment of the mA and/or kV according to patient size  and/or use of iterative reconstruction technique. CONTRAST:  OMNIPAQUE IOHEXOL 300 MG/ML  SOLN COMPARISON:  Chest CT 02/23/2023.  Abdominopelvic CT 02/27/2023. FINDINGS: CT CHEST FINDINGS Cardiovascular: A left subclavian Port-A-Cath has been placed, extending into the superior aspect of the right atrium. There is significant extrinsic compression of the right internal jugular, left brachiocephalic vein and SVC, similar to previous CT. There is also extrinsic mass effect on the right pulmonary artery and veins, also similar. No large vessel occlusion or intravascular thrombus identified. The heart size is stable. A moderate size pericardial effusion appears unchanged. Mediastinum/Nodes: Again demonstrated is extensive confluent lymphadenopathy extending from the right supraclavicular region through the mediastinum into the right hilum. There is a right supraclavicular component which is incompletely visualized, measuring approximately 4.9 x 3.7 cm on image 1/2. There is a right paratracheal component measuring 4.8 x 4.6 cm, and this has a 3.4 x 2.7 cm component extending anterior to the brachiocephalic veins on images 18/2. Overall, these lymph nodes are similar in size to the prior chest CT, although demonstrate lower central density suggesting response to treatment and necrosis. No progressive adenopathy identified. Stable extrinsic narrowing of the central right-sided airways without occlusion. The esophagus appears unremarkable. Lungs/Pleura: New moderate right and small left pleural effusions (compared with prior chest CT; minimally enlarged compared with more recent abdominal CT). Associated increased compressive atelectasis in the right middle lobe. There are additional increased patchy ground-glass opacities throughout both lungs which may reflect edema or posterior obstructive pneumonitis. There is asymmetric central airway thickening on the right. Underlying mild to moderate centrilobular emphysema.  No pneumothorax. Musculoskeletal/Chest wall: No chest wall mass or suspicious osseous findings. CT ABDOMEN AND PELVIS FINDINGS Hepatobiliary: The liver is normal in density without suspicious focal abnormality. Unchanged 9 mm low-density lesion in the left hepatic lobe on image 56/2, likely a cyst. No evidence of gallstones, gallbladder wall thickening or biliary dilatation. Pancreas: Unremarkable. No pancreatic ductal dilatation or surrounding inflammatory changes. Spleen: Normal in size without focal abnormality. Adrenals/Urinary Tract: Both adrenal glands appear normal. No evidence of urinary tract calculus, suspicious renal lesion or hydronephrosis. The bladder appears normal for its degree of distention. Stomach/Bowel: No enteric contrast administered. The stomach appears unremarkable for its degree of distension. No evidence of bowel wall thickening, distention or surrounding inflammatory change. Prominent stool throughout the colon. Vascular/Lymphatic: There are no enlarged abdominal or pelvic lymph nodes. Aortic and branch vessel atherosclerosis without evidence of aneurysm or large vessel occlusion. Reproductive: The uterus and ovaries appear normal. No adnexal mass. Other: No evidence of abdominal wall mass or hernia. No ascites or pneumoperitoneum. Musculoskeletal: No acute or significant osseous findings. IMPRESSION: 1. No significant change in size of extensive confluent lymphadenopathy extending from the right supraclavicular region through the mediastinum and right hilum. The lymph nodes demonstrate lower central density suggesting response to treatment and necrosis. 2. Grossly stable extrinsic compression of the right internal jugular vein, left brachiocephalic vein and SVC status post Port-A-Cath placement. These findings may contribute to symptoms of SVC syndrome. No evidence of large vessel occlusion  or intravascular thrombus. 3. No acute findings or evidence of metastatic disease in the abdomen  or pelvis. 4. Mildly increased moderate right and small left pleural effusions with increased compressive atelectasis in the right middle lobe. There are additional increased patchy ground-glass opacities throughout both lungs which may reflect edema or posterior obstructive pneumonitis. 5. Stable moderate pericardial effusion. 6. Aortic Atherosclerosis (ICD10-I70.0) and Emphysema (ICD10-J43.9). Electronically Signed   By: Carey Bullocks M.D.   On: 03/06/2023 18:17   CT ABDOMEN PELVIS W CONTRAST  Result Date: 03/06/2023 CLINICAL DATA:  Small cell lung cancer. Increasing back pain and extreme fatigue. 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 * Tracking Code: BO * EXAM: CT CHEST, ABDOMEN, AND PELVIS WITH CONTRAST TECHNIQUE: Multidetector CT imaging of the chest, abdomen and pelvis was performed following the standard protocol during bolus administration of intravenous contrast. RADIATION DOSE REDUCTION: This exam was performed according to the departmental dose-optimization program which includes automated exposure control, adjustment of the mA and/or kV according to patient size and/or use of iterative reconstruction technique. CONTRAST:  OMNIPAQUE IOHEXOL 300 MG/ML  SOLN COMPARISON:  Chest CT 02/23/2023.  Abdominopelvic CT 02/27/2023. FINDINGS: CT CHEST FINDINGS Cardiovascular: A left subclavian Port-A-Cath has been placed, extending into the superior aspect of the right atrium. There is significant extrinsic compression of the right internal jugular, left brachiocephalic vein and SVC, similar to previous CT. There is also extrinsic mass effect on the right pulmonary artery and veins, also similar. No large vessel occlusion or intravascular thrombus identified. The heart size is stable. A moderate size pericardial effusion appears unchanged. Mediastinum/Nodes: Again demonstrated is extensive confluent lymphadenopathy extending from the right supraclavicular region through the mediastinum into the right hilum.  There is a right supraclavicular component which is incompletely visualized, measuring approximately 4.9 x 3.7 cm on image 1/2. There is a right paratracheal component measuring 4.8 x 4.6 cm, and this has a 3.4 x 2.7 cm component extending anterior to the brachiocephalic veins on images 18/2. Overall, these lymph nodes are similar in size to the prior chest CT, although demonstrate lower central density suggesting response to treatment and necrosis. No progressive adenopathy identified. Stable extrinsic narrowing of the central right-sided airways without occlusion. The esophagus appears unremarkable. Lungs/Pleura: New moderate right and small left pleural effusions (compared with prior chest CT; minimally enlarged compared with more recent abdominal CT). Associated increased compressive atelectasis in the right middle lobe. There are additional increased patchy ground-glass opacities throughout both lungs which may reflect edema or posterior obstructive pneumonitis. There is asymmetric central airway thickening on the right. Underlying mild to moderate centrilobular emphysema. No pneumothorax. Musculoskeletal/Chest wall: No chest wall mass or suspicious osseous findings. CT ABDOMEN AND PELVIS FINDINGS Hepatobiliary: The liver is normal in density without suspicious focal abnormality. Unchanged 9 mm low-density lesion in the left hepatic lobe on image 56/2, likely a cyst. No evidence of gallstones, gallbladder wall thickening or biliary dilatation. Pancreas: Unremarkable. No pancreatic ductal dilatation or surrounding inflammatory changes. Spleen: Normal in size without focal abnormality. Adrenals/Urinary Tract: Both adrenal glands appear normal. No evidence of urinary tract calculus, suspicious renal lesion or hydronephrosis. The bladder appears normal for its degree of distention. Stomach/Bowel: No enteric contrast administered. The stomach appears unremarkable for its degree of distension. No evidence of bowel  wall thickening, distention or surrounding inflammatory change. Prominent stool throughout the colon. Vascular/Lymphatic: There are no enlarged abdominal or pelvic lymph nodes. Aortic and branch vessel atherosclerosis without evidence of aneurysm or large vessel occlusion. Reproductive:  The uterus and ovaries appear normal. No adnexal mass. Other: No evidence of abdominal wall mass or hernia. No ascites or pneumoperitoneum. Musculoskeletal: No acute or significant osseous findings. IMPRESSION: 1. No significant change in size of extensive confluent lymphadenopathy extending from the right supraclavicular region through the mediastinum and right hilum. The lymph nodes demonstrate lower central density suggesting response to treatment and necrosis. 2. Grossly stable extrinsic compression of the right internal jugular vein, left brachiocephalic vein and SVC status post Port-A-Cath placement. These findings may contribute to symptoms of SVC syndrome. No evidence of large vessel occlusion or intravascular thrombus. 3. No acute findings or evidence of metastatic disease in the abdomen or pelvis. 4. Mildly increased moderate right and small left pleural effusions with increased compressive atelectasis in the right middle lobe. There are additional increased patchy ground-glass opacities throughout both lungs which may reflect edema or posterior obstructive pneumonitis. 5. Stable moderate pericardial effusion. 6. Aortic Atherosclerosis (ICD10-I70.0) and Emphysema (ICD10-J43.9). Electronically Signed   By: Carey Bullocks M.D.   On: 03/06/2023 18:17   DG Chest Portable 1 View  Result Date: 03/06/2023 CLINICAL DATA:  Fatigue and weakness.  On chemotherapy. EXAM: PORTABLE CHEST 1 VIEW COMPARISON:  Chest x-ray 02/28/2023 FINDINGS: Hyperinflation. Small right effusion with adjacent opacity similar to previous. Stable interstitial prominence. Stable cardiopericardial silhouette. No pneumothorax. Again fullness of the right  lung hilum. Left IJ chest port in place tip at the SVC right atrial junction. The port is accessed. IMPRESSION: Persistent right-sided effusion adjacent lung opacity with fullness of the right side of the lung hilum and widening of the mediastinum. Hyperinflation. Chest port Electronically Signed   By: Karen Kays M.D.   On: 03/06/2023 16:50   IR IMAGING GUIDED PORT INSERTION  Result Date: 03/02/2023 INDICATION: History of metastatic lung cancer. In need of durable intravenous access for the initiation of chemotherapy. EXAM: IMPLANTED PORT A CATH PLACEMENT WITH ULTRASOUND AND FLUOROSCOPIC GUIDANCE COMPARISON:  Neck CT-02/23/2023 MEDICATIONS: None ANESTHESIA/SEDATION: Moderate (conscious) sedation was employed during this procedure as administered by the Interventional Radiology RN. A total of Benadryl 25 mg and Fentanyl 25 mcg was administered intravenously. Moderate Sedation Time: 23 minutes. The patient's level of consciousness and vital signs were monitored continuously by radiology nursing throughout the procedure under my direct supervision. CONTRAST:  None FLUOROSCOPY TIME:  24 seconds (4 mGy) COMPLICATIONS: None immediate. PROCEDURE: The procedure, risks, benefits, and alternatives were explained to the patient. Questions regarding the procedure were encouraged and answered. The patient understands and consents to the procedure. Given presence of known right supraclavicular bulky lymphadenopathy, the decision was made to proceed with left internal jugular approach port a catheter placement. As such, the left neck and chest were prepped with chlorhexidine in a sterile fashion, and a sterile drape was applied covering the operative field. Maximum barrier sterile technique with sterile gowns and gloves were used for the procedure. A timeout was performed prior to the initiation of the procedure. Local anesthesia was provided with 1% lidocaine with epinephrine. After creating a small venotomy incision, a  micropuncture kit was utilized to access the internal jugular vein. Real-time ultrasound guidance was utilized for vascular access including the acquisition of a permanent ultrasound image documenting patency of the accessed vessel. The microwire was utilized to measure appropriate catheter length. A subcutaneous port pocket was then created along the upper chest wall utilizing a combination of sharp and blunt dissection. The pocket was irrigated with sterile saline. A single lumen clear view power injectable port  was chosen for placement. The 8 Fr catheter was tunneled from the port pocket site to the venotomy incision. The port was placed in the pocket. The external catheter was trimmed to appropriate length. At the venotomy, an 8 Fr peel-away sheath was placed over a guidewire under fluoroscopic guidance. The catheter was then placed through the sheath and the sheath was removed. Final catheter positioning was confirmed and documented with a fluoroscopic spot radiograph. The port was accessed with a Huber needle, aspirated and flushed with heparinized saline. The venotomy site was closed with an interrupted 4-0 Vicryl suture. The port pocket incision was closed with interrupted 2-0 Vicryl suture. Dermabond and Steri-strips were applied to both incisions. Dressings were applied. The patient tolerated the procedure well without immediate post procedural complication. FINDINGS: After catheter placement, the tip lies within the superior cavoatrial junction. The catheter aspirates and flushes normally and is ready for immediate use. IMPRESSION: Successful placement of a left internal jugular approach power injectable Port-A-Cath. The catheter is ready for immediate use. Electronically Signed   By: Simonne Come M.D.   On: 03/02/2023 15:05   CT BONE MARROW BIOPSY & ASPIRATION  Result Date: 03/02/2023 INDICATION: History of metastatic lung cancer, now with thrombocytopenia. Please perform CT-guided bone marrow biopsy  for tissue diagnostic purposes. EXAM: CT-GUIDED BONE MARROW BIOPSY AND ASPIRATION MEDICATIONS: None ANESTHESIA/SEDATION: Moderate (conscious) sedation was employed during this procedure as administered by the Interventional Radiology RN. A total of Benadryl 25 mg, Versed 1 mg and Fentanyl 50 mcg was administered intravenously. Moderate Sedation Time: 10 minutes. The patient's level of consciousness and vital signs were monitored continuously by radiology nursing throughout the procedure under my direct supervision. COMPLICATIONS: None immediate. PROCEDURE: Informed consent was obtained from the patient following an explanation of the procedure, risks, benefits and alternatives. The patient understands, agrees and consents for the procedure. All questions were addressed. A time out was performed prior to the initiation of the procedure. The patient was positioned prone and non-contrast localization CT was performed of the pelvis to demonstrate the iliac marrow spaces. The operative site was prepped and draped in the usual sterile fashion. Under sterile conditions and local anesthesia, a 22 gauge spinal needle was utilized for procedural planning. Next, an 11 gauge coaxial bone biopsy needle was advanced into the left iliac marrow space. Needle position was confirmed with CT imaging. Initially, a bone marrow aspiration was performed. Next, a bone marrow biopsy was obtained with the 11 gauge outer bone marrow device. The needle was removed and superficial hemostasis was obtained with manual compression. A dressing was applied. The patient tolerated the procedure well without immediate post procedural complication. IMPRESSION: Successful CT guided left iliac bone marrow aspiration and core biopsy. Electronically Signed   By: Simonne Come M.D.   On: 03/02/2023 15:03   IR US CHEST  Result Date: 02/28/2023 CLINICAL DATA:  Evaluate for thoracentesis. EXAM: CHEST ULTRASOUND COMPARISON:  Chest radiograph 02/28/2023  FINDINGS: Small amount of right pleural fluid was identified. No percutaneous window for thoracentesis due to a flap of lung. Thoracentesis not performed. IMPRESSION: Small right pleural effusion. Thoracentesis not performed due to lack of a safe percutaneous window. Electronically Signed   By: Richarda Overlie M.D.   On: 02/28/2023 16:10   NM Bone Scan Whole Body  Result Date: 02/28/2023 CLINICAL DATA:  Lung cancer EXAM: NUCLEAR MEDICINE WHOLE BODY BONE SCAN TECHNIQUE: Whole body anterior and posterior images were obtained approximately 3 hours after intravenous injection of radiopharmaceutical. RADIOPHARMACEUTICALS:  21.0 mCi Technetium-21m MDP IV COMPARISON:  CT scan earlier June 2024 of multiple areas. FINDINGS: Physiologic distribution of radiotracer overall. There is some uptake involving the right midfoot consistent with known provided history of injury in the past. There is slight asymmetric uptake along the proximal left tibia from the other side. There is also some subtle areas of low uptake along the distal femoral metaphysis. Recommend dedicated x-rays. Mild areas of degenerative uptake such as along the extremities. Of note the kidneys are not as well seen as typical. Please correlate with clinical findings. IMPRESSION: Subtle asymmetric uptake involving the proximal left tibia. Slight low uptake along the distal femurs. Recommend dedicated x-rays if there is no known history. Atypical low uptake of the renal parenchyma. The rest of the findings do not suggest super scan but please correlate with the patient's renal function and other history. This may be technical. Electronically Signed   By: Karen Kays M.D.   On: 02/28/2023 14:40   DG CHEST PORT 1 VIEW  Result Date: 02/28/2023 CLINICAL DATA:  Pleural effusion on right. EXAM: PORTABLE CHEST 1 VIEW COMPARISON:  Radiographs 02/27/2023 and 02/23/2023. Abdominal CT 02/27/2023 and chest CT 02/23/2023. FINDINGS: 0845 hours. Mild patient rotation to the  right. Grossly stable heart size and mediastinal contours with known extensive mediastinal lymphadenopathy. There are enlarging right greater than left pleural effusions with increasing atelectasis at both lung bases. No pneumothorax. The bones appear unchanged. Telemetry leads overlie the chest. IMPRESSION: 1. Enlarging right greater than left pleural effusions with increasing bibasilar atelectasis. 2. Grossly stable mediastinal lymphadenopathy from recent chest CT, presumed lung cancer. Electronically Signed   By: Carey Bullocks M.D.   On: 02/28/2023 12:55   MR BRAIN W WO CONTRAST  Result Date: 02/28/2023 CLINICAL DATA:  Staging of small cell lung carcinoma EXAM: MRI HEAD WITHOUT AND WITH CONTRAST TECHNIQUE: Multiplanar, multiecho pulse sequences of the brain and surrounding structures were obtained without and with intravenous contrast. CONTRAST:  7mL GADAVIST GADOBUTROL 1 MMOL/ML IV SOLN COMPARISON:  None Available. FINDINGS: Brain: No acute infarct, mass effect or extra-axial collection. No acute or chronic hemorrhage. Normal white matter signal. On diffusion-weighted imaging and the FLAIR sequence, there are bilateral convexity signal abnormalities within the subdural space. The midline structures are normal. There is smooth, diffuse pachymeningeal contrast enhancement. There are no intraparenchymal contrast enhancing lesions. Vascular: Major flow voids are preserved. Skull and upper cervical spine: Normal calvarium and skull base. Visualized upper cervical spine and soft tissues are normal. Sinuses/Orbits:Mastoid effusions. Paranasal sinuses are clear. Normal orbits. IMPRESSION: 1. No intraparenchymal metastatic disease. 2. Bilateral convexity signal abnormalities within the subdural space, which may indicate aging subdural blood. 3. Smooth, diffuse pachymeningeal contrast enhancement. This may indicate intracranial hypotension or may be a sequela of recent lumbar puncture (if any) or reactive changes  related to chronic subdural hematomas. Metastatic disease felt less likely given the lack nodularity. Electronically Signed   By: Deatra Robinson M.D.   On: 02/28/2023 02:17   DG CHEST PORT 1 VIEW  Result Date: 02/27/2023 CLINICAL DATA:  Shortness of breath. EXAM: PORTABLE CHEST 1 VIEW COMPARISON:  February 23, 2023. FINDINGS: Stable cardiomegaly. Mild central pulmonary vascular congestion is noted. Minimal bibasilar pulmonary edema is noted with small right pleural effusion. Bony thorax is unremarkable. IMPRESSION: Stable cardiomegaly with mild central pulmonary vascular congestion. Minimal bibasilar pulmonary edema is noted with probable small right pleural effusion. Electronically Signed   By: Lupita Raider M.D.   On: 02/27/2023 16:27  CT ABDOMEN PELVIS W CONTRAST  Result Date: 02/27/2023 CLINICAL DATA:  Non-small-cell lung cancer. Staging. * Tracking Code: BO * EXAM: CT ABDOMEN AND PELVIS WITH CONTRAST TECHNIQUE: Multidetector CT imaging of the abdomen and pelvis was performed using the standard protocol following bolus administration of intravenous contrast. RADIATION DOSE REDUCTION: This exam was performed according to the departmental dose-optimization program which includes automated exposure control, adjustment of the mA and/or kV according to patient size and/or use of iterative reconstruction technique. CONTRAST:  75mL OMNIPAQUE IOHEXOL 350 MG/ML SOLN COMPARISON:  Chest CTA 02/23/2023 FINDINGS: Lower chest: Bibasilar collapse/consolidation with bilateral pleural effusions, right greater than left. Pleural effusions are progressive since the chest CT 4 days ago. Hepatobiliary: Small area of low attenuation in the anterior liver, adjacent to the falciform ligament, is in a characteristic location for focal fatty deposition. 9 mm hypodensity in the left liver (15/3) is too small to characterize but statistically is likely benign. There is no evidence for gallstones, gallbladder wall thickening, or  pericholecystic fluid. No intrahepatic or extrahepatic biliary dilation. Pancreas: No focal mass lesion. No dilatation of the main duct. No intraparenchymal cyst. No peripancreatic edema. Spleen: No splenomegaly. No suspicious focal mass lesion. Adrenals/Urinary Tract: No adrenal nodule or mass. Kidneys unremarkable. No evidence for hydroureter. The urinary bladder appears normal for the degree of distention. Stomach/Bowel: Stomach is distended with food and fluid. Duodenum is normally positioned as is the ligament of Treitz. No small bowel wall thickening. No small bowel dilatation. The terminal ileum is normal. The appendix is normal. No gross colonic mass. No colonic wall thickening. Vascular/Lymphatic: Moderate atherosclerotic calcification is noted in the wall of the aorta. There is no gastrohepatic or hepatoduodenal ligament lymphadenopathy. No retroperitoneal or mesenteric lymphadenopathy. No pelvic sidewall lymphadenopathy. Reproductive: Unremarkable. Other: No intraperitoneal free fluid. Musculoskeletal: No worrisome lytic or sclerotic osseous abnormality. IMPRESSION: 1. No definite evidence for metastatic disease in the abdomen or pelvis. 2. 9 mm hypodensity in the left liver is too small to characterize but statistically is likely benign. Attention on follow-up recommended. 3. Bibasilar collapse/consolidation with bilateral pleural effusions, right greater than left. Pleural effusions are progressive since the chest CT 4 days ago. 4.  Aortic Atherosclerosis (ICD10-I70.0). Electronically Signed   By: Kennith Center M.D.   On: 02/27/2023 11:47   [place] CORE BIOPSY (SOFT TISSUE)  Result Date: 02/26/2023 INDICATION: Cervical and supraclavicular adenopathy.  No diagnosis. EXAM: ULTRASOUND-GUIDED RIGHT SUPRACLAVICULAR NODAL MASS BIOPSY COMPARISON:  CT neck and chest, 09/24/2022. MEDICATIONS: None ANESTHESIA/SEDATION: Local anesthetic was administered. COMPLICATIONS: None immediate. TECHNIQUE: Informed written  consent was obtained from the patient and/or patient's representative after a discussion of the risks, benefits and alternatives to treatment. Questions regarding the procedure were encouraged and answered. Initial ultrasound scanning demonstrated RIGHT supraclavicular nodal mass. An ultrasound image was saved for documentation purposes. The procedure was planned. A timeout was performed prior to the initiation of the procedure. The operative was prepped and draped in the usual sterile fashion, and a sterile drape was applied covering the operative field. A timeout was performed prior to the initiation of the procedure. Local anesthesia was provided with 1% lidocaine with epinephrine. Under direct ultrasound guidance, an 18 gauge core needle device was utilized to obtain to obtain 3 core needle biopsies of the RIGHT supraclavicular nodal mass. The samples were placed in saline and submitted to pathology. The needle was removed and superficial hemostasis was achieved with manual compression. Post procedure scan was negative for significant hematoma. A dressing was applied.  The patient tolerated the procedure well without immediate postprocedural complication. IMPRESSION: Successful core biopsy of RIGHT supraclavicular nodal mass. Roanna Banning, MD [facility]iology Electronically Signed   By: Roanna Banning M.D.   On: 02/26/2023 10:15   ECHOCARDIOGRAM COMPLETE  Result Date: 02/24/2023    ECHOCARDIOGRAM REPORT   Patient Name:   Ana Curry Date of Exam: 02/24/2023 Medical Rec #:  [id number]         Height:       68.0 in Accession #:    [id number]        Weight:       157.0 lb Date of Birth:  [birth year]        BSA:          1.844 m Patient Age:    51 years          BP:           111/73 mmHg Patient Gender: F                 HR:           88 bpm. Exam Location:  Inpatient Procedure: 2D Echo, Cardiac Doppler and Color Doppler Indications:    A-FIB  History:         Patient has no prior history of Echocardiogram examinations.                 Arrythmias:Atrial Fibrillation; Risk Factors:Current Smoker.  Sonographer:    Darlys Gales Referring Phys: [phone] JACOB J STINSON IMPRESSIONS  1. Left ventricular ejection fraction, by estimation, is 60 to 65%. The left ventricle has normal function. The left ventricle has no regional wall motion abnormalities. Left ventricular diastolic parameters are indeterminate.  2. Right ventricular systolic function is normal. The right ventricular size is normal. Tricuspid regurgitation signal is inadequate for assessing PA pressure.  3. The mitral valve is normal in structure. No evidence of mitral valve regurgitation.  4. The aortic valve is grossly normal. Aortic valve regurgitation is not visualized.  5. The inferior vena cava is dilated in size with <50% respiratory variability, suggesting right atrial pressure of 15 mmHg. FINDINGS  Left Ventricle: Left ventricular ejection fraction, by estimation, is 60 to 65%. The left ventricle has normal function. The left ventricle has no regional wall motion abnormalities. The left ventricular internal cavity size was normal in size. There is  no left ventricular hypertrophy. Left ventricular diastolic parameters are indeterminate. Right Ventricle: The right ventricular size is normal. Right ventricular systolic function is normal. Tricuspid regurgitation signal is inadequate for assessing PA pressure. Left Atrium: Left atrial size was normal in size. Right Atrium: Right atrial size was normal in size. Pericardium: There is no evidence of pericardial effusion. Mitral Valve: The mitral valve is normal in structure. No evidence of mitral valve regurgitation. Tricuspid Valve: Tricuspid valve regurgitation is not demonstrated. Aortic Valve: The aortic valve is grossly normal. Aortic valve regurgitation is not visualized. Aortic valve mean gradient measures 5.0 mmHg. Aortic valve peak gradient measures 7.6 mmHg.  Aortic valve area, by VTI measures 2.82 cm. Pulmonic Valve: Pulmonic valve regurgitation is not visualized. Aorta: The aortic root and ascending aorta are structurally normal, with no evidence of dilitation. Venous: The inferior vena cava is dilated in size with less than 50% respiratory variability, suggesting right atrial pressure of 15 mmHg. IAS/Shunts: The interatrial septum was not well visualized.  LEFT VENTRICLE PLAX 2D LVIDd:  5.10 cm   Diastology LVIDs:         3.00 cm   LV e' medial:    7.07 cm/s LV PW:         0.80 cm   LV E/e' medial:  12.3 LV IVS:        0.70 cm   LV e' lateral:   10.20 cm/s LVOT diam:     1.80 cm   LV E/e' lateral: 8.5 LV SV:         76 LV SV Index:   41 LVOT Area:     2.54 cm  RIGHT VENTRICLE             IVC RV S prime:     15.10 cm/s  IVC diam: 2.50 cm TAPSE (M-mode): 2.5 cm LEFT ATRIUM             Index        RIGHT ATRIUM           Index LA Vol (A2C):   21.7 ml 11.77 ml/m  RA Area:     14.00 cm LA Vol (A4C):   27.3 ml 14.81 ml/m  RA Volume:   34.60 ml  18.76 ml/m LA Biplane Vol: 24.7 ml 13.40 ml/m  AORTIC VALVE AV Area (Vmax):    2.42 cm AV Area (Vmean):   2.26 cm AV Area (VTI):     2.82 cm AV Vmax:           138.00 cm/s AV Vmean:          102.000 cm/s AV VTI:            0.269 m AV Peak Grad:      7.6 mmHg AV Mean Grad:      5.0 mmHg LVOT Vmax:         131.00 cm/s LVOT Vmean:        90.500 cm/s LVOT VTI:          0.298 m LVOT/AV VTI ratio: 1.11  AORTA Ao Root diam: 3.20 cm Ao Asc diam:  3.50 cm MITRAL VALVE MV Area (PHT): 5.23 cm     SHUNTS MV Decel Time: 145 msec     Systemic VTI:  0.30 m MV E velocity: 87.00 cm/s   Systemic Diam: 1.80 cm MV A velocity: 102.00 cm/s MV E/A ratio:  0.85 Photographer signed by Carolan Clines Signature Date/Time: 02/24/2023/3:44:27 PM    Final    CT Angio Chest PE W and/or Wo Contrast  Result Date: 02/23/2023 CLINICAL DATA:  Shortness of breath, right neck mass, chest mass, headache * Tracking Code: BO * EXAM: CT  ANGIOGRAPHY CHEST WITH CONTRAST TECHNIQUE: Multidetector CT imaging of the chest was performed using the standard protocol during bolus administration of intravenous contrast. Multiplanar CT image reconstructions and MIPs were obtained to evaluate the vascular anatomy. RADIATION DOSE REDUCTION: This exam was performed according to the departmental dose-optimization program which includes automated exposure control, adjustment of the mA and/or kV according to patient size and/or use of iterative reconstruction technique. CONTRAST:  75mL OMNIPAQUE IOHEXOL 350 MG/ML SOLN COMPARISON:  None Available. FINDINGS: Cardiovascular: No filling defect is identified in the pulmonary arterial tree to suggest pulmonary embolus. Mild atheromatous vascular calcification of the aortic arch. The large mediastinal mass bulges into the SVC posteriorly for example on image 128 series 4, probably from extrinsic compression, definite invasion is not observed. Because of lack of complete opacification from pulmonary arterial timing, patency of the  left brachiocephalic vein is uncertain. Moderate pericardial effusion. Mediastinum/Nodes: Large right infrahilar and hilar mass extending into the mediastinum and tracking all the way to the thoracic inlet. This extends around the right mainstem bronchus, right upper lobe bronchus, and bronchus intermedius as well as the right lower lobe and right middle lobe bronchi, and likewise surrounds and narrows the right pulmonary artery and its branches. In the subcarinal region, the conglomerate mass measures about 7.9 by 5.6 cm (image 144, series 4) and the mass flattens and narrows the SVC without overt occlusion. In the right infrahilar region, the observed mass measures 4.7 by 3.9 cm on image 179 series 4. Presumed lymph node anterior to the SVC in the upper chest measures 2.6 cm in short axis on image 36 series 3. A right lower neck level IV mass/lymph node measures 3.4 cm in short axis on image 10  series 3. Lungs/Pleura: The mass in the right chest displaces the upper trachea to the left. There is substantial narrowing of the right upper lobe bronchus, right middle lobe bronchus, and severe narrowing of the right lower lobe bronchus due to the surrounding mass. Patchy regions of nodularity in the right lower lobe distal to the mass may represent postobstructive pneumonitis or satellite tumor nodules. Centrilobular emphysema. Scarring or atelectasis in the right middle lobe and right upper lobe anteriorly. Mild dependent atelectasis in both lower lobes. Trace right pleural effusion, image 50 series 3. Upper Abdomen: Small nonspecific hypodense lesion in the left hepatic lobe 0.9 cm in long axis on image 96 series 3. Musculoskeletal: Unremarkable Review of the MIP images confirms the above findings. IMPRESSION: 1. Large right infrahilar and hilar mass extending into the mediastinum and tracking all the way to the thoracic inlet. This extends around the right mainstem bronchus, right upper lobe bronchus, and bronchus intermedius as well as the right lower lobe and right middle lobe bronchi, and likewise surrounds and narrows the right pulmonary artery and its branches. The mass flattens and narrows the SVC without overt occlusion, and is confluent with bulky right level IV neck adenopathy. Top differential diagnostic considerations include lung cancer (such as squamous cell or small cell) versus extensive thoracic and right lower neck lymphoma. 2. Patchy regions of nodularity in the right lower lobe distal to the mass may represent postobstructive pneumonitis or satellite tumor nodules. 3. Presumed lymph node anterior to the SVC in the upper chest measures 2.6 cm in short axis. 4. A right lower neck level IV mass/lymph node measures 3.4 cm in short axis, compatible with malignancy. 5. Moderate pericardial effusion. 6. Trace right pleural effusion. 7. Centrilobular emphysema. 8. Small nonspecific hypodense lesion  in the left hepatic lobe 0.9 cm in long axis. 9. No pulmonary embolus is identified. 10. Aortic atherosclerosis. Aortic Atherosclerosis (ICD10-I70.0) and Emphysema (ICD10-J43.9). Electronically Signed   By: Gaylyn Rong M.D.   On: 02/23/2023 13:39   CT Soft Tissue Neck W Contrast  Result Date: 02/23/2023 CLINICAL DATA:  Neck mass, nonpulsatile right sided neck mass EXAM: CT NECK WITH CONTRAST TECHNIQUE: Multidetector CT imaging of the neck was performed using the standard protocol following the bolus administration of intravenous contrast. RADIATION DOSE REDUCTION: This exam was performed according to the departmental dose-optimization program which includes automated exposure control, adjustment of the mA and/or kV according to patient size and/or use of iterative reconstruction technique. CONTRAST:  75mL OMNIPAQUE IOHEXOL 350 MG/ML SOLN COMPARISON:  None Available. FINDINGS: Pharynx and larynx: Normal. No mass or swelling. Salivary glands: No  inflammation, mass, or stone. Thyroid: Normal. Lymph nodes: There is bulky lower cervical and mediastinal lymphadenopathy involving the right level 3 and level 4 lymph node stations, the right supraclavicular region, and the anterior and posterior mediastinum. Cervical lymphadenopathy significant mass effect on the right internal jugular vein (series 5, image 68). Vascular: Negative. Mass effect on the right internal jugular vein, as above Limited intracranial: Negative. Visualized orbits: Negative. Mastoids and visualized paranasal sinuses: Moderate left-sided mastoid effusion. There is pneumatization of the petrous apices with air-fluid levels. Paranasal sinuses are clear. Orbits are unremarkable. Skeleton: No acute or aggressive process. Upper chest: Mild paraseptal emphysema. See same day CT chest for additional findings Other: None. IMPRESSION: 1. Bulky right lower cervical and mediastinal lymphadenopathy, concerning for metastatic disease or lymphoma. 2. See  separate CT chest for additional findings. Emphysema (ICD10-J43.9). Electronically Signed   By: Lorenza Cambridge M.D.   On: 02/23/2023 13:17   CT Head Wo Contrast  Result Date: 02/23/2023 CLINICAL DATA:  Headache EXAM: CT HEAD WITHOUT CONTRAST TECHNIQUE: Contiguous axial images were obtained from the base of the skull through the vertex without intravenous contrast. RADIATION DOSE REDUCTION: This exam was performed according to the departmental dose-optimization program which includes automated exposure control, adjustment of the mA and/or kV according to patient size and/or use of iterative reconstruction technique. COMPARISON:  None Available. FINDINGS: Brain: There is no acute intracranial hemorrhage, extra-axial fluid collection, or acute infarct Parenchymal volume is normal. The ventricles are normal in size. Gray-white differentiation is preserved The pituitary and suprasellar region are normal. There is no mass lesion. There is no mass effect or midline shift. Vascular: No hyperdense vessel or unexpected calcification. Skull: Normal. Negative for fracture or focal lesion. Sinuses/Orbits: The imaged paranasal sinuses are clear. The globes and orbits are unremarkable. Other: There are small bilateral mastoid effusions. IMPRESSION: 1. No acute intracranial pathology. 2. Small bilateral mastoid effusions. Electronically Signed   By: Lesia Hausen M.D.   On: 02/23/2023 13:15   DG Chest Port 1 View  Result Date: 02/23/2023 CLINICAL DATA:  Sepsis EXAM: PORTABLE CHEST 1 VIEW COMPARISON:  X-ray 01/09/2023 FINDINGS: No pneumothorax, effusion or edema. Calcified aorta. Overlapping cardiac leads. There is new widening of the mediastinum compared to prior x-ray and fullness of the right lung hilum. Recommend further evaluation with CT to delineate for potential mass. IMPRESSION: New widening of the mediastinum with masslike fullness in the right lung hilum. Recommend follow up contrast CT to further delineate  Electronically Signed   By: Karen Kays M.D.   On: 02/23/2023 13:11

## 2023-03-07 NOTE — Progress Notes (Signed)
PROGRESS NOTE  Ana Curry    DOB: 28-Nov-1970, 52 y.o.  OZH:[id number]    Code Status: Full Code   DOA: 03/06/2023   LOS: 1   Brief hospital course  Ana Curry is a 52 y.o. 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  They presented from home to the ED on 03/06/2023 with SOB and pain x several days. Patient just discharged from hospital after prolonged admission from 6/14-6/24.  She was diagnosed with small cell carcinoma of the right lung with extension of right infrahilar, right mainstem bronchus, right upper lobe and thoracic lymphadenopathy.     Diagnosis was confirmed with cervical node biopsy 6/17.  CT abdomen/pelvis and MRI brain did not show any metastatic disease.  Bone scan showed subtle asymmetric uptake involving proximal left tibia. Bone marrow biopsy 6/21 has now resulted and consistent with metastatic small cell carcinoma.  I discussed these findings with patient on admission.   Patient was started on chemotherapy with first cycle on 6/21.  She underwent radiation treatment on 6/21 and 6/24.   During hospitalization she developed A-fib with RVR initially treated with Cardizem and amiodarone drip.  She was transitioned to oral amiodarone.  Noted to have severe thrombocytopenia.  She was seen by cardiology who did not recommend anticoagulation.   Patient was discharged to home yesterday after her radiation treatment.  She was seen in oncology clinic today with plan to continue on radiation therapy and immunotherapy.  Radiation machine however was not working.  She was noted to be ill-appearing, hypotensive and tachycardic, and short of breath.  She was sent to the ED for further evaluation.   Patient has been generally weak, difficulty walking due to to significant fatigue.  She has not fallen or lost  consciousness.  She has been short of breath, worse when laying flat.  She has been experiencing pain all over her body which is relatively new.  She has not had any significant nausea, vomiting, abdominal pain.  Appetite has been okay.  In the ED, it was found that they had BP 97/68, pulse 101, RR 22, temp 98.2 F, SpO2 93% on 3 L O2 via [place].  Significant findings included WBC 4.7, hemoglobin 10.0, platelets 33,000, sodium 128, potassium 3.6, bicarb 26, BUN 17, creatinine 0.76, serum glucose 97. UA negative. CT chest/abdomen/pelvis obtained. No significant change in size of extensive confluent lymphadenopathy extending from right supraclavicular region through the mediastinum and right hilum. Stable extrinsic compression of the right internal jugular vein, left brachiocephalic vein, and SVC s/p Port-A-Cath placement. No evidence of large vessel occlusion or intravascular thrombus. No acute findings or evidence of metastatic disease in the abdomen or pelvis. Mildly increased moderate right and small left pleural effusions noted with increased compressive atelectasis in the right middle lobe. Additionally increased patchy groundglass opacities throughout both lungs noted. Stable moderate pericardial effusion seen.   They were initially treated with 1 L LR, IV morphine, Dilaudid, fentanyl, Zofran   Patient was admitted to medicine service for further workup and management of dyspnea as outlined in detail below.  03/07/23 -stable  Assessment & Plan  Principal Problem:   Acute respiratory failure with hypoxia (HCC) Active Problems:   Thrombocytopenia (HCC)   Small cell carcinoma of lung, right (HCC)   SVC syndrome   Paroxysmal atrial fibrillation (HCC)   Cancer associated  pain  Small cell carcinoma of right lung with extension of right infrahilar, right mainstem bronchus, right upper lobe and thoracic inlet adenopathy: She has been started on chemotherapy and radiation during recent  hospitalization. -Bone biopsy 6/21. consistent with metastatic small cell carcinoma.  These results were discussed with patient on admission -management per heme/onc  - will continue radiation.  - PT/OT evaluation for support evaluation. May need placement - [facility] consult for placement, equipment needed   Acute respiratory failure with hypoxia- had been recently discharged with oxygen Bilateral pleural effusions- R worse than left and worsened since last admission when was not amenable to thoracentesis.  COPD: Multifactorial due to pulmonary mass and suspected underlying COPD.  Moderate right pleural effusion noted, increased from prior imaging.   - IR consult  - Can be reevaluated for thoracentesis. -Continue DuoNebs and albuterol as needed -Continue supplemental O2 as needed   Moderate pericardial effusion: Stable pericardial effusion noted on CT.   Thrombocytopenia: platelets 33>31. No active bleeding. Likely secondary to malignancy. -monitor   SVC syndrome: Secondary to cancer, stable findings on repeat CT imaging.  Has been undergoing radiation treatment.   Hyponatremia- improving. Na 128>130. S/p 1 L fluids given in the ED.  - continue MIVF - monitor am   Paroxysmal atrial fibrillation: Developed A-fib RVR on recent admission.  Currently stable.  Continue amiodarone.  Not on anticoagulation due to thrombocytopenia.   Cancer associated pain: Worsened pain throughout whole body.  Will start OxyContin 10 mg BID.  IV Dilaudid as needed.  Opiate related constipation- describes over a week since last BM. Declines suppository or enema.  - continue miralax, senna  Body mass index is 25.85 kg/m.  VTE ppx: SCDs Start: 03/06/23 1947  Diet:     Diet   Diet regular Fluid consistency: Thin   Consultants: Heme/onc IR  Subjective 03/07/23    Pt reports struggling to breath. She is anxious about her further diagnosis of bone marrow involvement. Describes lower back pain.  Denies bleeding. States it's been about a week since last BM.    Objective   Vitals:   03/06/23 2135 03/06/23 2315 03/07/23 0507 03/07/23 0536  BP: 112/66 101/60  110/68  Pulse: (!) 105 (!) 105  (!) 115  Resp: (!) 24 20  18   Temp: 97.7 F (36.5 C) 97.8 F (36.6 C)  97.7 F (36.5 C)  TempSrc: Oral Oral  Oral  SpO2: 96% 93% 94% 92%  Weight:  77.1 kg    Height:  5\' 8"  (1.727 m)      Intake/Output Summary (Last 24 hours) at 03/07/2023 0752 Last data filed at 03/06/2023 2300 Gross per 24 hour  Intake 1240 ml  Output --  Net 1240 ml   Filed Weights   03/06/23 1320 03/06/23 2315  Weight: 71.2 kg 77.1 kg    Physical Exam:  General: awake, alert, in mild distress HEENT: atraumatic, clear conjunctiva, anicteric sclera, MMM, hearing grossly normal Respiratory:increased respiratory effort. Cardiovascular: quick capillary refill, normal S1/S2, RRR, no JVD, murmurs Gastrointestinal: soft, mild tender Nervous: A&O x3. no gross focal neurologic deficits, normal speech Extremities: moves all equally, no edema, normal tone Skin: dry, intact, normal temperature, normal color. No rashes, lesions or ulcers on exposed skin Psychiatry: anxious mood, congruent affect  Labs   I have personally reviewed the following labs and imaging studies CBC    Component Value Date/Time   WBC 3.8 (L) 03/07/2023 0525   RBC 2.92 (L) 03/07/2023 0525   HGB 9.3 (  L) 03/07/2023 0525   HCT 28.2 (L) 03/07/2023 0525   PLT 31 (L) 03/07/2023 0525   MCV 96.6 03/07/2023 0525   MCH 31.8 03/07/2023 0525   MCHC 33.0 03/07/2023 0525   RDW 14.6 03/07/2023 0525   LYMPHSABS 1.4 03/06/2023 1614   MONOABS 0.0 (L) 03/06/2023 1614   EOSABS 0.0 03/06/2023 1614   BASOSABS 0.0 03/06/2023 1614      Latest Ref Rng & Units 03/07/2023    5:25 AM 03/06/2023    4:14 PM 03/05/2023    8:22 AM  BMP  Glucose 70 - 99 mg/dL 540  97  981   BUN 6 - 20 mg/dL 19  17  15    Creatinine 0.44 - 1.00 mg/dL 1.91  4.78  2.95   Sodium 135 -  145 mmol/L 130  128  133   Potassium 3.5 - 5.1 mmol/L 3.6  3.6  3.6   Chloride 98 - 111 mmol/L 95  95  101   CO2 22 - 32 mmol/L 25  26  24    Calcium 8.9 - 10.3 mg/dL 9.2  8.9  9.4     CT Chest W Contrast  Result Date: 03/06/2023 CLINICAL DATA:  Small cell lung cancer. Increasing back pain and extreme fatigue. 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 * Tracking Code: BO * EXAM: CT CHEST, ABDOMEN, AND PELVIS WITH CONTRAST TECHNIQUE: Multidetector CT imaging of the chest, abdomen and pelvis was performed following the standard protocol during bolus administration of intravenous contrast. RADIATION DOSE REDUCTION: This exam was performed according to the departmental dose-optimization program which includes automated exposure control, adjustment of the mA and/or kV according to patient size and/or use of iterative reconstruction technique. CONTRAST:  OMNIPAQUE IOHEXOL 300 MG/ML  SOLN COMPARISON:  Chest CT 02/23/2023.  Abdominopelvic CT 02/27/2023. FINDINGS: CT CHEST FINDINGS Cardiovascular: A left subclavian Port-A-Cath has been placed, extending into the superior aspect of the right atrium. There is significant extrinsic compression of the right internal jugular, left brachiocephalic vein and SVC, similar to previous CT. There is also extrinsic mass effect on the right pulmonary artery and veins, also similar. No large vessel occlusion or intravascular thrombus identified. The heart size is stable. A moderate size pericardial effusion appears unchanged. Mediastinum/Nodes: Again demonstrated is extensive confluent lymphadenopathy extending from the right supraclavicular region through the mediastinum into the right hilum. There is a right supraclavicular component which is incompletely visualized, measuring approximately 4.9 x 3.7 cm on image 1/2. There is a right paratracheal component measuring 4.8 x 4.6 cm, and this has a 3.4 x 2.7 cm component extending anterior to the brachiocephalic veins on images 18/2.  Overall, these lymph nodes are similar in size to the prior chest CT, although demonstrate lower central density suggesting response to treatment and necrosis. No progressive adenopathy identified. Stable extrinsic narrowing of the central right-sided airways without occlusion. The esophagus appears unremarkable. Lungs/Pleura: New moderate right and small left pleural effusions (compared with prior chest CT; minimally enlarged compared with more recent abdominal CT). Associated increased compressive atelectasis in the right middle lobe. There are additional increased patchy ground-glass opacities throughout both lungs which may reflect edema or posterior obstructive pneumonitis. There is asymmetric central airway thickening on the right. Underlying mild to moderate centrilobular emphysema. No pneumothorax. Musculoskeletal/Chest wall: No chest wall mass or suspicious osseous findings. CT ABDOMEN AND PELVIS FINDINGS Hepatobiliary: The liver is normal in density without suspicious focal abnormality. Unchanged 9 mm low-density lesion in the left hepatic lobe on image 56/2,  likely a cyst. No evidence of gallstones, gallbladder wall thickening or biliary dilatation. Pancreas: Unremarkable. No pancreatic ductal dilatation or surrounding inflammatory changes. Spleen: Normal in size without focal abnormality. Adrenals/Urinary Tract: Both adrenal glands appear normal. No evidence of urinary tract calculus, suspicious renal lesion or hydronephrosis. The bladder appears normal for its degree of distention. Stomach/Bowel: No enteric contrast administered. The stomach appears unremarkable for its degree of distension. No evidence of bowel wall thickening, distention or surrounding inflammatory change. Prominent stool throughout the colon. Vascular/Lymphatic: There are no enlarged abdominal or pelvic lymph nodes. Aortic and branch vessel atherosclerosis without evidence of aneurysm or large vessel occlusion. Reproductive: The  uterus and ovaries appear normal. No adnexal mass. Other: No evidence of abdominal wall mass or hernia. No ascites or pneumoperitoneum. Musculoskeletal: No acute or significant osseous findings. IMPRESSION: 1. No significant change in size of extensive confluent lymphadenopathy extending from the right supraclavicular region through the mediastinum and right hilum. The lymph nodes demonstrate lower central density suggesting response to treatment and necrosis. 2. Grossly stable extrinsic compression of the right internal jugular vein, left brachiocephalic vein and SVC status post Port-A-Cath placement. These findings may contribute to symptoms of SVC syndrome. No evidence of large vessel occlusion or intravascular thrombus. 3. No acute findings or evidence of metastatic disease in the abdomen or pelvis. 4. Mildly increased moderate right and small left pleural effusions with increased compressive atelectasis in the right middle lobe. There are additional increased patchy ground-glass opacities throughout both lungs which may reflect edema or posterior obstructive pneumonitis. 5. Stable moderate pericardial effusion. 6. Aortic Atherosclerosis (ICD10-I70.0) and Emphysema (ICD10-J43.9). Electronically Signed   By: Carey Bullocks M.D.   On: 03/06/2023 18:17   CT ABDOMEN PELVIS W CONTRAST  Result Date: 03/06/2023 CLINICAL DATA:  Small cell lung cancer. Increasing back pain and extreme fatigue. 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 * Tracking Code: BO * EXAM: CT CHEST, ABDOMEN, AND PELVIS WITH CONTRAST TECHNIQUE: Multidetector CT imaging of the chest, abdomen and pelvis was performed following the standard protocol during bolus administration of intravenous contrast. RADIATION DOSE REDUCTION: This exam was performed according to the departmental dose-optimization program which includes automated exposure control, adjustment of the mA and/or kV according to patient size and/or use of iterative reconstruction technique.  CONTRAST:  OMNIPAQUE IOHEXOL 300 MG/ML  SOLN COMPARISON:  Chest CT 02/23/2023.  Abdominopelvic CT 02/27/2023. FINDINGS: CT CHEST FINDINGS Cardiovascular: A left subclavian Port-A-Cath has been placed, extending into the superior aspect of the right atrium. There is significant extrinsic compression of the right internal jugular, left brachiocephalic vein and SVC, similar to previous CT. There is also extrinsic mass effect on the right pulmonary artery and veins, also similar. No large vessel occlusion or intravascular thrombus identified. The heart size is stable. A moderate size pericardial effusion appears unchanged. Mediastinum/Nodes: Again demonstrated is extensive confluent lymphadenopathy extending from the right supraclavicular region through the mediastinum into the right hilum. There is a right supraclavicular component which is incompletely visualized, measuring approximately 4.9 x 3.7 cm on image 1/2. There is a right paratracheal component measuring 4.8 x 4.6 cm, and this has a 3.4 x 2.7 cm component extending anterior to the brachiocephalic veins on images 18/2. Overall, these lymph nodes are similar in size to the prior chest CT, although demonstrate lower central density suggesting response to treatment and necrosis. No progressive adenopathy identified. Stable extrinsic narrowing of the central right-sided airways without occlusion. The esophagus appears unremarkable. Lungs/Pleura: New moderate right and small  left pleural effusions (compared with prior chest CT; minimally enlarged compared with more recent abdominal CT). Associated increased compressive atelectasis in the right middle lobe. There are additional increased patchy ground-glass opacities throughout both lungs which may reflect edema or posterior obstructive pneumonitis. There is asymmetric central airway thickening on the right. Underlying mild to moderate centrilobular emphysema. No pneumothorax. Musculoskeletal/Chest wall: No  chest wall mass or suspicious osseous findings. CT ABDOMEN AND PELVIS FINDINGS Hepatobiliary: The liver is normal in density without suspicious focal abnormality. Unchanged 9 mm low-density lesion in the left hepatic lobe on image 56/2, likely a cyst. No evidence of gallstones, gallbladder wall thickening or biliary dilatation. Pancreas: Unremarkable. No pancreatic ductal dilatation or surrounding inflammatory changes. Spleen: Normal in size without focal abnormality. Adrenals/Urinary Tract: Both adrenal glands appear normal. No evidence of urinary tract calculus, suspicious renal lesion or hydronephrosis. The bladder appears normal for its degree of distention. Stomach/Bowel: No enteric contrast administered. The stomach appears unremarkable for its degree of distension. No evidence of bowel wall thickening, distention or surrounding inflammatory change. Prominent stool throughout the colon. Vascular/Lymphatic: There are no enlarged abdominal or pelvic lymph nodes. Aortic and branch vessel atherosclerosis without evidence of aneurysm or large vessel occlusion. Reproductive: The uterus and ovaries appear normal. No adnexal mass. Other: No evidence of abdominal wall mass or hernia. No ascites or pneumoperitoneum. Musculoskeletal: No acute or significant osseous findings. IMPRESSION: 1. No significant change in size of extensive confluent lymphadenopathy extending from the right supraclavicular region through the mediastinum and right hilum. The lymph nodes demonstrate lower central density suggesting response to treatment and necrosis. 2. Grossly stable extrinsic compression of the right internal jugular vein, left brachiocephalic vein and SVC status post Port-A-Cath placement. These findings may contribute to symptoms of SVC syndrome. No evidence of large vessel occlusion or intravascular thrombus. 3. No acute findings or evidence of metastatic disease in the abdomen or pelvis. 4. Mildly increased moderate right and  small left pleural effusions with increased compressive atelectasis in the right middle lobe. There are additional increased patchy ground-glass opacities throughout both lungs which may reflect edema or posterior obstructive pneumonitis. 5. Stable moderate pericardial effusion. 6. Aortic Atherosclerosis (ICD10-I70.0) and Emphysema (ICD10-J43.9). Electronically Signed   By: Carey Bullocks M.D.   On: 03/06/2023 18:17   DG Chest Portable 1 View  Result Date: 03/06/2023 CLINICAL DATA:  Fatigue and weakness.  On chemotherapy. EXAM: PORTABLE CHEST 1 VIEW COMPARISON:  Chest x-ray 02/28/2023 FINDINGS: Hyperinflation. Small right effusion with adjacent opacity similar to previous. Stable interstitial prominence. Stable cardiopericardial silhouette. No pneumothorax. Again fullness of the right lung hilum. Left IJ chest port in place tip at the SVC right atrial junction. The port is accessed. IMPRESSION: Persistent right-sided effusion adjacent lung opacity with fullness of the right side of the lung hilum and widening of the mediastinum. Hyperinflation. Chest port Electronically Signed   By: Karen Kays M.D.   On: 03/06/2023 16:50    Disposition Plan & Communication  Patient status: Inpatient  Admitted From: Home Planned disposition location: TBD Anticipated discharge date: 6/28 pending respiratory state improvement  Family Communication: none at bedside    Author: Leeroy Bock, DO [facility] 03/07/2023, 7:52 AM   Available by Epic secure chat 7AM-7PM. If 7PM-7AM, please contact night-coverage.  [facility] contact information found on [email or web address].
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
## 2023-03-08 ENCOUNTER — Other Ambulatory Visit: Payer: Self-pay

## 2023-03-08 ENCOUNTER — Encounter: Payer: Self-pay | Admitting: Hematology

## 2023-03-08 ENCOUNTER — Ambulatory Visit: Payer: Medicaid Other

## 2023-03-08 ENCOUNTER — Other Ambulatory Visit: Payer: No Typology Code available for payment source | Admitting: Licensed Clinical Social Worker

## 2023-03-08 DIAGNOSIS — J9601 Acute respiratory failure with hypoxia: Secondary | ICD-10-CM | POA: Diagnosis not present

## 2023-03-08 LAB — CBC WITH DIFFERENTIAL/PLATELET
Abs Immature Granulocytes: 0.05 10*3/uL (ref 0.00–0.07)
Basophils Absolute: 0 10*3/uL (ref 0.0–0.1)
Basophils Relative: 0 %
Eosinophils Absolute: 0 10*3/uL (ref 0.0–0.5)
Eosinophils Relative: 0 %
HCT: 26.6 % — ABNORMAL LOW (ref 36.0–46.0)
Hemoglobin: 8.8 g/dL — ABNORMAL LOW (ref 12.0–15.0)
Immature Granulocytes: 2 %
Lymphocytes Relative: 38 %
Lymphs Abs: 1.1 10*3/uL (ref 0.7–4.0)
MCH: 32 pg (ref 26.0–34.0)
MCHC: 33.1 g/dL (ref 30.0–36.0)
MCV: 96.7 fL (ref 80.0–100.0)
Monocytes Absolute: 0 10*3/uL — ABNORMAL LOW (ref 0.1–1.0)
Monocytes Relative: 1 %
Neutro Abs: 1.7 10*3/uL (ref 1.7–7.7)
Neutrophils Relative %: 59 %
Platelets: 27 10*3/uL — CL (ref 150–400)
RBC: 2.75 MIL/uL — ABNORMAL LOW (ref 3.87–5.11)
RDW: 14.7 % (ref 11.5–15.5)
WBC: 2.9 10*3/uL — ABNORMAL LOW (ref 4.0–10.5)
nRBC: 0 % (ref 0.0–0.2)

## 2023-03-08 LAB — BPAM PLATELET PHERESIS
Blood Product Expiration Date: 202406282359
Unit Type and Rh: 6200

## 2023-03-08 LAB — BASIC METABOLIC PANEL
Anion gap: 9 (ref 5–15)
BUN: 16 mg/dL (ref 6–20)
CO2: 27 mmol/L (ref 22–32)
Calcium: 9 mg/dL (ref 8.9–10.3)
Chloride: 98 mmol/L (ref 98–111)
Creatinine, Ser: 0.93 mg/dL (ref 0.44–1.00)
GFR, Estimated: >60 mL/min (ref >60)
Glucose, Bld: 109 mg/dL — ABNORMAL HIGH (ref 70–99)
Potassium: 3.7 mmol/L (ref 3.5–5.1)
Sodium: 134 mmol/L — ABNORMAL LOW (ref 135–145)

## 2023-03-08 LAB — PREPARE PLATELET PHERESIS

## 2023-03-08 LAB — TYPE AND SCREEN
ABO/RH(D): A POS
Antibody Screen: NEGATIVE

## 2023-03-08 LAB — ABO/RH: ABO/RH(D): A POS

## 2023-03-08 MED ORDER — GUAIFENESIN ER 600 MG PO TB12
600.0000 mg | ORAL_TABLET | Freq: Two times a day (BID) | ORAL | Status: AC
  Administered 2023-03-08 – 2023-03-09 (×4): 600 mg via ORAL
  Filled 2023-03-08 (×4): qty 1

## 2023-03-08 MED ORDER — PSEUDOEPHEDRINE HCL ER 120 MG PO TB12
120.0000 mg | ORAL_TABLET | Freq: Two times a day (BID) | ORAL | Status: AC
  Administered 2023-03-08 – 2023-03-09 (×4): 120 mg via ORAL
  Filled 2023-03-08 (×4): qty 1

## 2023-03-08 MED ORDER — DIAZEPAM 5 MG PO TABS
10.0000 mg | ORAL_TABLET | Freq: Two times a day (BID) | ORAL | Status: DC | PRN
  Administered 2023-03-08 – 2023-03-09 (×3): 10 mg via ORAL
  Filled 2023-03-08 (×3): qty 2

## 2023-03-08 MED ORDER — SODIUM CHLORIDE 0.9% IV SOLUTION: Freq: Once | INTRAVENOUS | Status: AC

## 2023-03-08 NOTE — Progress Notes (Signed)
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   Pain from procedure/lower back pain Could be an element of bone pain from cancer involvement of her bone marrow She will continue pain medications as needed We discussed risk of constipation with chronic pain medicine   Opiate induced constipation I suspect an element of her pain could be related to constipation This is seen on her imaging study Recommend laxative therapy I recommend avoid enema or suppository due to recent chemotherapy and anticipated pancytopenia in the future   Respiratory failure due to lung cancer Continue oxygen therapy I recommend attempt for thoracentesis   Acquired pancytopenia Possible bone marrow involvement Anticipate her platelet count will drop over the next few days Will check CBC daily and transfuse if signs of bleeding or platelet count less than 10,000   Low BP, hyponatremia, poor oral intake This has improved with IV fluids from prior hospitalization Recommend gentle IV fluid hydration with normal saline   Goals of care She will continue radiation therapy in the hospital I do not think it is realistic to expect her to be discharged home as the patient is not able to take care of herself even with family support and she is readmitted in less than 24 hours after discharge   Discharge planning Recommend consideration for skilled nursing facility as part of her discharge planning Due to progressive pancytopenia, I anticipate she will be here throughout the weekend.  It is not safe to be discharged until her platelet  counts not trending up without transfusion support which I anticipate will be sometime next week  All questions were answered. The patient knows to call the clinic with any problems, questions or concerns.   The total time spent in the appointment was 40 minutes encounter with patients including review of chart and various tests results, discussions about plan of care and coordination of care plan  Artis Delay, MD 03/08/2023 12:43 PM  Subjective:  She is seen in the room.  Her stepmom and father are present.  She felt slightly better.  Her pain is well-controlled.  She continues to have constipation.  No fever or chills.  She is still quite short of breath  Objective:  Vitals:   03/08/23 0859 03/08/23 1156  BP: 105/62   Pulse:    Resp: 16   Temp: 98 F (36.7 C)   SpO2: 95% 96%     Intake/Output Summary (Last 24 hours) at 03/08/2023 1243 Last data filed at 03/08/2023 0100 Gross per 24 hour  Intake 1202.88 ml  Output 650 ml  Net 552.88 ml    GENERAL:alert, no distress and comfortable NEURO: alert & oriented x 3 with fluent speech, no focal motor/sensory deficits   Labs:  Recent Labs    03/05/23 0822 03/06/23 1614 03/07/23 0525 03/08/23 0323  NA 133* 128* 130* 134*  K 3.6 3.6 3.6 3.7  CL 101 95* 95* 98  CO2 24 26 25 27   GLUCOSE 113* 97 118* 109*  BUN 15 17 19 16   CREATININE 0.83 0.76 0.91 0.93  CALCIUM 9.4 8.9 9.2 9.0  GFRNONAA >60 >60 >60 >60  PROT 5.9* 5.8*  5.6*  --   ALBUMIN 2.8* 2.8* 2.6*  --   AST 39 44* 45*  --   ALT 26 24 24   --   ALKPHOS 86 84 80  --   BILITOT 0.5 0.9 0.7  --     Studies:  CT Chest W Contrast  Result Date: 03/06/2023 CLINICAL DATA:  Small cell lung cancer. Increasing back pain and extreme fatigue. 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 * Tracking Code: BO * EXAM: CT CHEST, ABDOMEN, AND PELVIS WITH CONTRAST TECHNIQUE: Multidetector CT imaging of the chest, abdomen and pelvis was performed following the standard protocol during bolus  administration of intravenous contrast. RADIATION DOSE REDUCTION: This exam was performed according to the departmental dose-optimization program which includes automated exposure control, adjustment of the mA and/or kV according to patient size and/or use of iterative reconstruction technique. CONTRAST:  OMNIPAQUE IOHEXOL 300 MG/ML  SOLN COMPARISON:  Chest CT 02/23/2023.  Abdominopelvic CT 02/27/2023. FINDINGS: CT CHEST FINDINGS Cardiovascular: A left subclavian Port-A-Cath has been placed, extending into the superior aspect of the right atrium. There is significant extrinsic compression of the right internal jugular, left brachiocephalic vein and SVC, similar to previous CT. There is also extrinsic mass effect on the right pulmonary artery and veins, also similar. No large vessel occlusion or intravascular thrombus identified. The heart size is stable. A moderate size pericardial effusion appears unchanged. Mediastinum/Nodes: Again demonstrated is extensive confluent lymphadenopathy extending from the right supraclavicular region through the mediastinum into the right hilum. There is a right supraclavicular component which is incompletely visualized, measuring approximately 4.9 x 3.7 cm on image 1/2. There is a right paratracheal component measuring 4.8 x 4.6 cm, and this has a 3.4 x 2.7 cm component extending anterior to the brachiocephalic veins on images 18/2. Overall, these lymph nodes are similar in size to the prior chest CT, although demonstrate lower central density suggesting response to treatment and necrosis. No progressive adenopathy identified. Stable extrinsic narrowing of the central right-sided airways without occlusion. The esophagus appears unremarkable. Lungs/Pleura: New moderate right and small left pleural effusions (compared with prior chest CT; minimally enlarged compared with more recent abdominal CT). Associated increased compressive atelectasis in the right middle lobe. There are  additional increased patchy ground-glass opacities throughout both lungs which may reflect edema or posterior obstructive pneumonitis. There is asymmetric central airway thickening on the right. Underlying mild to moderate centrilobular emphysema. No pneumothorax. Musculoskeletal/Chest wall: No chest wall mass or suspicious osseous findings. CT ABDOMEN AND PELVIS FINDINGS Hepatobiliary: The liver is normal in density without suspicious focal abnormality. Unchanged 9 mm low-density lesion in the left hepatic lobe on image 56/2, likely a cyst. No evidence of gallstones, gallbladder wall thickening or biliary dilatation. Pancreas: Unremarkable. No pancreatic ductal dilatation or surrounding inflammatory changes. Spleen: Normal in size without focal abnormality. Adrenals/Urinary Tract: Both adrenal glands appear normal. No evidence of urinary tract calculus, suspicious renal lesion or hydronephrosis. The bladder appears normal for its degree of distention. Stomach/Bowel: No enteric contrast administered. The stomach appears unremarkable for its degree of distension. No evidence of bowel wall thickening, distention or surrounding inflammatory change. Prominent stool throughout the colon. Vascular/Lymphatic: There are no enlarged abdominal or pelvic lymph nodes. Aortic and branch vessel atherosclerosis without evidence of aneurysm or large vessel occlusion. Reproductive: The uterus and ovaries appear normal. No adnexal mass. Other: No evidence of abdominal wall mass or hernia. No ascites or pneumoperitoneum. Musculoskeletal: No acute or significant osseous findings. IMPRESSION: 1. No significant change in  size of extensive confluent lymphadenopathy extending from the right supraclavicular region through the mediastinum and right hilum. The lymph nodes demonstrate lower central density suggesting response to treatment and necrosis. 2. Grossly stable extrinsic compression of the right internal jugular vein, left  brachiocephalic vein and SVC status post Port-A-Cath placement. These findings may contribute to symptoms of SVC syndrome. No evidence of large vessel occlusion or intravascular thrombus. 3. No acute findings or evidence of metastatic disease in the abdomen or pelvis. 4. Mildly increased moderate right and small left pleural effusions with increased compressive atelectasis in the right middle lobe. There are additional increased patchy ground-glass opacities throughout both lungs which may reflect edema or posterior obstructive pneumonitis. 5. Stable moderate pericardial effusion. 6. Aortic Atherosclerosis (ICD10-I70.0) and Emphysema (ICD10-J43.9). Electronically Signed   By: Carey Bullocks M.D.   On: 03/06/2023 18:17   CT ABDOMEN PELVIS W CONTRAST  Result Date: 03/06/2023 CLINICAL DATA:  Small cell lung cancer. Increasing back pain and extreme fatigue. 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 * Tracking Code: BO * EXAM: CT CHEST, ABDOMEN, AND PELVIS WITH CONTRAST TECHNIQUE: Multidetector CT imaging of the chest, abdomen and pelvis was performed following the standard protocol during bolus administration of intravenous contrast. RADIATION DOSE REDUCTION: This exam was performed according to the departmental dose-optimization program which includes automated exposure control, adjustment of the mA and/or kV according to patient size and/or use of iterative reconstruction technique. CONTRAST:  OMNIPAQUE IOHEXOL 300 MG/ML  SOLN COMPARISON:  Chest CT 02/23/2023.  Abdominopelvic CT 02/27/2023. FINDINGS: CT CHEST FINDINGS Cardiovascular: A left subclavian Port-A-Cath has been placed, extending into the superior aspect of the right atrium. There is significant extrinsic compression of the right internal jugular, left brachiocephalic vein and SVC, similar to previous CT. There is also extrinsic mass effect on the right pulmonary artery and veins, also similar. No large vessel occlusion or intravascular thrombus  identified. The heart size is stable. A moderate size pericardial effusion appears unchanged. Mediastinum/Nodes: Again demonstrated is extensive confluent lymphadenopathy extending from the right supraclavicular region through the mediastinum into the right hilum. There is a right supraclavicular component which is incompletely visualized, measuring approximately 4.9 x 3.7 cm on image 1/2. There is a right paratracheal component measuring 4.8 x 4.6 cm, and this has a 3.4 x 2.7 cm component extending anterior to the brachiocephalic veins on images 18/2. Overall, these lymph nodes are similar in size to the prior chest CT, although demonstrate lower central density suggesting response to treatment and necrosis. No progressive adenopathy identified. Stable extrinsic narrowing of the central right-sided airways without occlusion. The esophagus appears unremarkable. Lungs/Pleura: New moderate right and small left pleural effusions (compared with prior chest CT; minimally enlarged compared with more recent abdominal CT). Associated increased compressive atelectasis in the right middle lobe. There are additional increased patchy ground-glass opacities throughout both lungs which may reflect edema or posterior obstructive pneumonitis. There is asymmetric central airway thickening on the right. Underlying mild to moderate centrilobular emphysema. No pneumothorax. Musculoskeletal/Chest wall: No chest wall mass or suspicious osseous findings. CT ABDOMEN AND PELVIS FINDINGS Hepatobiliary: The liver is normal in density without suspicious focal abnormality. Unchanged 9 mm low-density lesion in the left hepatic lobe on image 56/2, likely a cyst. No evidence of gallstones, gallbladder wall thickening or biliary dilatation. Pancreas: Unremarkable. No pancreatic ductal dilatation or surrounding inflammatory changes. Spleen: Normal in size without focal abnormality. Adrenals/Urinary Tract: Both adrenal glands appear normal. No  evidence of urinary tract calculus, suspicious renal lesion  or hydronephrosis. The bladder appears normal for its degree of distention. Stomach/Bowel: No enteric contrast administered. The stomach appears unremarkable for its degree of distension. No evidence of bowel wall thickening, distention or surrounding inflammatory change. Prominent stool throughout the colon. Vascular/Lymphatic: There are no enlarged abdominal or pelvic lymph nodes. Aortic and branch vessel atherosclerosis without evidence of aneurysm or large vessel occlusion. Reproductive: The uterus and ovaries appear normal. No adnexal mass. Other: No evidence of abdominal wall mass or hernia. No ascites or pneumoperitoneum. Musculoskeletal: No acute or significant osseous findings. IMPRESSION: 1. No significant change in size of extensive confluent lymphadenopathy extending from the right supraclavicular region through the mediastinum and right hilum. The lymph nodes demonstrate lower central density suggesting response to treatment and necrosis. 2. Grossly stable extrinsic compression of the right internal jugular vein, left brachiocephalic vein and SVC status post Port-A-Cath placement. These findings may contribute to symptoms of SVC syndrome. No evidence of large vessel occlusion or intravascular thrombus. 3. No acute findings or evidence of metastatic disease in the abdomen or pelvis. 4. Mildly increased moderate right and small left pleural effusions with increased compressive atelectasis in the right middle lobe. There are additional increased patchy ground-glass opacities throughout both lungs which may reflect edema or posterior obstructive pneumonitis. 5. Stable moderate pericardial effusion. 6. Aortic Atherosclerosis (ICD10-I70.0) and Emphysema (ICD10-J43.9). Electronically Signed   By: Carey Bullocks M.D.   On: 03/06/2023 18:17   DG Chest Portable 1 View  Result Date: 03/06/2023 CLINICAL DATA:  Fatigue and weakness.  On chemotherapy.  EXAM: PORTABLE CHEST 1 VIEW COMPARISON:  Chest x-ray 02/28/2023 FINDINGS: Hyperinflation. Small right effusion with adjacent opacity similar to previous. Stable interstitial prominence. Stable cardiopericardial silhouette. No pneumothorax. Again fullness of the right lung hilum. Left IJ chest port in place tip at the SVC right atrial junction. The port is accessed. IMPRESSION: Persistent right-sided effusion adjacent lung opacity with fullness of the right side of the lung hilum and widening of the mediastinum. Hyperinflation. Chest port Electronically Signed   By: Karen Kays M.D.   On: 03/06/2023 16:50   IR IMAGING GUIDED PORT INSERTION  Result Date: 03/02/2023 INDICATION: History of metastatic lung cancer. In need of durable intravenous access for the initiation of chemotherapy. EXAM: IMPLANTED PORT A CATH PLACEMENT WITH ULTRASOUND AND FLUOROSCOPIC GUIDANCE COMPARISON:  Neck CT-02/23/2023 MEDICATIONS: None ANESTHESIA/SEDATION: Moderate (conscious) sedation was employed during this procedure as administered by the Interventional Radiology RN. A total of Benadryl 25 mg and Fentanyl 25 mcg was administered intravenously. Moderate Sedation Time: 23 minutes. The patient's level of consciousness and vital signs were monitored continuously by radiology nursing throughout the procedure under my direct supervision. CONTRAST:  None FLUOROSCOPY TIME:  24 seconds (4 mGy) COMPLICATIONS: None immediate. PROCEDURE: The procedure, risks, benefits, and alternatives were explained to the patient. Questions regarding the procedure were encouraged and answered. The patient understands and consents to the procedure. Given presence of known right supraclavicular bulky lymphadenopathy, the decision was made to proceed with left internal jugular approach port a catheter placement. As such, the left neck and chest were prepped with chlorhexidine in a sterile fashion, and a sterile drape was applied covering the operative field.  Maximum barrier sterile technique with sterile gowns and gloves were used for the procedure. A timeout was performed prior to the initiation of the procedure. Local anesthesia was provided with 1% lidocaine with epinephrine. After creating a small venotomy incision, a micropuncture kit was utilized to access the internal jugular vein. Real-time  ultrasound guidance was utilized for vascular access including the acquisition of a permanent ultrasound image documenting patency of the accessed vessel. The microwire was utilized to measure appropriate catheter length. A subcutaneous port pocket was then created along the upper chest wall utilizing a combination of sharp and blunt dissection. The pocket was irrigated with sterile saline. A single lumen clear view power injectable port was chosen for placement. The 8 Fr catheter was tunneled from the port pocket site to the venotomy incision. The port was placed in the pocket. The external catheter was trimmed to appropriate length. At the venotomy, an 8 Fr peel-away sheath was placed over a guidewire under fluoroscopic guidance. The catheter was then placed through the sheath and the sheath was removed. Final catheter positioning was confirmed and documented with a fluoroscopic spot radiograph. The port was accessed with a Huber needle, aspirated and flushed with heparinized saline. The venotomy site was closed with an interrupted 4-0 Vicryl suture. The port pocket incision was closed with interrupted 2-0 Vicryl suture. Dermabond and Steri-strips were applied to both incisions. Dressings were applied. The patient tolerated the procedure well without immediate post procedural complication. FINDINGS: After catheter placement, the tip lies within the superior cavoatrial junction. The catheter aspirates and flushes normally and is ready for immediate use. IMPRESSION: Successful placement of a left internal jugular approach power injectable Port-A-Cath. The catheter is ready  for immediate use. Electronically Signed   By: Simonne Come M.D.   On: 03/02/2023 15:05   CT BONE MARROW BIOPSY & ASPIRATION  Result Date: 03/02/2023 INDICATION: History of metastatic lung cancer, now with thrombocytopenia. Please perform CT-guided bone marrow biopsy for tissue diagnostic purposes. EXAM: CT-GUIDED BONE MARROW BIOPSY AND ASPIRATION MEDICATIONS: None ANESTHESIA/SEDATION: Moderate (conscious) sedation was employed during this procedure as administered by the Interventional Radiology RN. A total of Benadryl 25 mg, Versed 1 mg and Fentanyl 50 mcg was administered intravenously. Moderate Sedation Time: 10 minutes. The patient's level of consciousness and vital signs were monitored continuously by radiology nursing throughout the procedure under my direct supervision. COMPLICATIONS: None immediate. PROCEDURE: Informed consent was obtained from the patient following an explanation of the procedure, risks, benefits and alternatives. The patient understands, agrees and consents for the procedure. All questions were addressed. A time out was performed prior to the initiation of the procedure. The patient was positioned prone and non-contrast localization CT was performed of the pelvis to demonstrate the iliac marrow spaces. The operative site was prepped and draped in the usual sterile fashion. Under sterile conditions and local anesthesia, a 22 gauge spinal needle was utilized for procedural planning. Next, an 11 gauge coaxial bone biopsy needle was advanced into the left iliac marrow space. Needle position was confirmed with CT imaging. Initially, a bone marrow aspiration was performed. Next, a bone marrow biopsy was obtained with the 11 gauge outer bone marrow device. The needle was removed and superficial hemostasis was obtained with manual compression. A dressing was applied. The patient tolerated the procedure well without immediate post procedural complication. IMPRESSION: Successful CT guided left  iliac bone marrow aspiration and core biopsy. Electronically Signed   By: Simonne Come M.D.   On: 03/02/2023 15:03   IR US CHEST  Result Date: 02/28/2023 CLINICAL DATA:  Evaluate for thoracentesis. EXAM: CHEST ULTRASOUND COMPARISON:  Chest radiograph 02/28/2023 FINDINGS: Small amount of right pleural fluid was identified. No percutaneous window for thoracentesis due to a flap of lung. Thoracentesis not performed. IMPRESSION: Small right pleural effusion. Thoracentesis not  performed due to lack of a safe percutaneous window. Electronically Signed   By: Richarda Overlie M.D.   On: 02/28/2023 16:10   NM Bone Scan Whole Body  Result Date: 02/28/2023 CLINICAL DATA:  Lung cancer EXAM: NUCLEAR MEDICINE WHOLE BODY BONE SCAN TECHNIQUE: Whole body anterior and posterior images were obtained approximately 3 hours after intravenous injection of radiopharmaceutical. RADIOPHARMACEUTICALS:  21.0 mCi Technetium-11m MDP IV COMPARISON:  CT scan earlier June 2024 of multiple areas. FINDINGS: Physiologic distribution of radiotracer overall. There is some uptake involving the right midfoot consistent with known provided history of injury in the past. There is slight asymmetric uptake along the proximal left tibia from the other side. There is also some subtle areas of low uptake along the distal femoral metaphysis. Recommend dedicated x-rays. Mild areas of degenerative uptake such as along the extremities. Of note the kidneys are not as well seen as typical. Please correlate with clinical findings. IMPRESSION: Subtle asymmetric uptake involving the proximal left tibia. Slight low uptake along the distal femurs. Recommend dedicated x-rays if there is no known history. Atypical low uptake of the renal parenchyma. The rest of the findings do not suggest super scan but please correlate with the patient's renal function and other history. This may be technical. Electronically Signed   By: Karen Kays M.D.   On: 02/28/2023 14:40   DG  CHEST PORT 1 VIEW  Result Date: 02/28/2023 CLINICAL DATA:  Pleural effusion on right. EXAM: PORTABLE CHEST 1 VIEW COMPARISON:  Radiographs 02/27/2023 and 02/23/2023. Abdominal CT 02/27/2023 and chest CT 02/23/2023. FINDINGS: 0845 hours. Mild patient rotation to the right. Grossly stable heart size and mediastinal contours with known extensive mediastinal lymphadenopathy. There are enlarging right greater than left pleural effusions with increasing atelectasis at both lung bases. No pneumothorax. The bones appear unchanged. Telemetry leads overlie the chest. IMPRESSION: 1. Enlarging right greater than left pleural effusions with increasing bibasilar atelectasis. 2. Grossly stable mediastinal lymphadenopathy from recent chest CT, presumed lung cancer. Electronically Signed   By: Carey Bullocks M.D.   On: 02/28/2023 12:55   MR BRAIN W WO CONTRAST  Result Date: 02/28/2023 CLINICAL DATA:  Staging of small cell lung carcinoma EXAM: MRI HEAD WITHOUT AND WITH CONTRAST TECHNIQUE: Multiplanar, multiecho pulse sequences of the brain and surrounding structures were obtained without and with intravenous contrast. CONTRAST:  7mL GADAVIST GADOBUTROL 1 MMOL/ML IV SOLN COMPARISON:  None Available. FINDINGS: Brain: No acute infarct, mass effect or extra-axial collection. No acute or chronic hemorrhage. Normal white matter signal. On diffusion-weighted imaging and the FLAIR sequence, there are bilateral convexity signal abnormalities within the subdural space. The midline structures are normal. There is smooth, diffuse pachymeningeal contrast enhancement. There are no intraparenchymal contrast enhancing lesions. Vascular: Major flow voids are preserved. Skull and upper cervical spine: Normal calvarium and skull base. Visualized upper cervical spine and soft tissues are normal. Sinuses/Orbits:Mastoid effusions. Paranasal sinuses are clear. Normal orbits. IMPRESSION: 1. No intraparenchymal metastatic disease. 2. Bilateral  convexity signal abnormalities within the subdural space, which may indicate aging subdural blood. 3. Smooth, diffuse pachymeningeal contrast enhancement. 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 Metastatic disease felt less likely given the lack nodularity. Electronically Signed   By: Deatra Robinson M.D.   On: 02/28/2023 02:17   DG CHEST PORT 1 VIEW  Result Date: 02/27/2023 CLINICAL DATA:  Shortness of breath. EXAM: PORTABLE CHEST 1 VIEW COMPARISON:  February 23, 2023. FINDINGS: Stable cardiomegaly. Mild central pulmonary vascular congestion is noted. Minimal bibasilar pulmonary edema is noted with small right pleural effusion. Bony thorax is unremarkable. IMPRESSION: Stable cardiomegaly with mild central pulmonary vascular congestion. Minimal bibasilar pulmonary edema is noted with probable small right pleural effusion. Electronically Signed   By: Lupita Raider M.D.   On: 02/27/2023 16:27   CT ABDOMEN PELVIS W CONTRAST  Result Date: 02/27/2023 CLINICAL DATA:  Non-small-cell lung cancer. Staging. * Tracking Code: BO * EXAM: CT ABDOMEN AND PELVIS WITH CONTRAST TECHNIQUE: Multidetector CT imaging of the abdomen and pelvis was performed using the standard protocol following bolus administration of intravenous contrast. RADIATION DOSE REDUCTION: This exam was performed according to the departmental dose-optimization program which includes automated exposure control, adjustment of the mA and/or kV according to patient size and/or use of iterative reconstruction technique. CONTRAST:  75mL OMNIPAQUE IOHEXOL 350 MG/ML SOLN COMPARISON:  Chest CTA 02/23/2023 FINDINGS: Lower chest: Bibasilar collapse/consolidation with bilateral pleural effusions, right greater than left. Pleural effusions are progressive since the chest CT 4 days ago. Hepatobiliary: Small area of low attenuation in the anterior liver,  adjacent to the falciform ligament, is in a characteristic location for focal fatty deposition. 9 mm hypodensity in the left liver (15/3) is too small to characterize but statistically is likely benign. There is no evidence for gallstones, gallbladder wall thickening, or pericholecystic fluid. No intrahepatic or extrahepatic biliary dilation. Pancreas: No focal mass lesion. No dilatation of the main duct. No intraparenchymal cyst. No peripancreatic edema. Spleen: No splenomegaly. No suspicious focal mass lesion. Adrenals/Urinary Tract: No adrenal nodule or mass. Kidneys unremarkable. No evidence for hydroureter. The urinary bladder appears normal for the degree of distention. Stomach/Bowel: Stomach is distended with food and fluid. Duodenum is normally positioned as is the ligament of Treitz. No small bowel wall thickening. No small bowel dilatation. The terminal ileum is normal. The appendix is normal. No gross colonic mass. No colonic wall thickening. Vascular/Lymphatic: Moderate atherosclerotic calcification is noted in the wall of the aorta. There is no gastrohepatic or hepatoduodenal ligament lymphadenopathy. No retroperitoneal or mesenteric lymphadenopathy. No pelvic sidewall lymphadenopathy. Reproductive: Unremarkable. Other: No intraperitoneal free fluid. Musculoskeletal: No worrisome lytic or sclerotic osseous abnormality. IMPRESSION: 1. No definite evidence for metastatic disease in the abdomen or pelvis. 2. 9 mm hypodensity in the left liver is too small to characterize but statistically is likely benign. Attention on follow-up recommended. 3. Bibasilar collapse/consolidation with bilateral pleural effusions, right greater than left. Pleural effusions are progressive since the chest CT 4 days ago. 4.  Aortic Atherosclerosis (ICD10-I70.0). Electronically Signed   By: Kennith Center M.D.   On: 02/27/2023 11:47   [place] CORE BIOPSY (SOFT TISSUE)  Result Date: 02/26/2023 INDICATION: Cervical and  supraclavicular adenopathy.  No diagnosis. EXAM: ULTRASOUND-GUIDED RIGHT SUPRACLAVICULAR NODAL MASS BIOPSY COMPARISON:  CT neck and chest, 09/24/2022. MEDICATIONS: None ANESTHESIA/SEDATION: Local anesthetic was administered. COMPLICATIONS: None immediate. TECHNIQUE: Informed written consent was obtained from the patient and/or patient's representative after a discussion of the risks, benefits and alternatives to treatment. Questions regarding the procedure were encouraged and answered. Initial ultrasound scanning demonstrated RIGHT supraclavicular nodal mass. An ultrasound image was saved for documentation purposes. The procedure was planned. A timeout was performed prior to the initiation of the procedure. The operative was prepped and draped in the usual sterile fashion, and a sterile drape was applied covering the operative field. A timeout was performed prior to the initiation of the procedure. Local anesthesia was  provided with 1% lidocaine with epinephrine. Under direct ultrasound guidance, an 18 gauge core needle device was utilized to obtain to obtain 3 core needle biopsies of the RIGHT supraclavicular nodal mass. The samples were placed in saline and submitted to pathology. The needle was removed and superficial hemostasis was achieved with manual compression. Post procedure scan was negative for significant hematoma. A dressing was applied. The patient tolerated the procedure well without immediate postprocedural complication. IMPRESSION: Successful core biopsy of RIGHT supraclavicular nodal mass. Roanna Banning, MD [facility] Electronically Signed   By: Roanna Banning M.D.   On: 02/26/2023 10:15   ECHOCARDIOGRAM COMPLETE  Result Date: 02/24/2023    ECHOCARDIOGRAM REPORT   Patient Name:   ALURA OLVEDA Date of Exam: 02/24/2023 Medical Rec #:  [id number]         Height:       68.0 in Accession #:    [id number]        Weight:       157.0 lb Date of  Birth:  Jun 08, 1971        BSA:          1.844 m Patient Age:    51 years          BP:           111/73 mmHg Patient Gender: F                 HR:           88 bpm. Exam Location:  Inpatient Procedure: 2D Echo, Cardiac Doppler and Color Doppler Indications:    A-FIB  History:        Patient has no prior history of Echocardiogram examinations.                 Arrythmias:Atrial Fibrillation; Risk Factors:Current Smoker.  Sonographer:    Darlys Gales Referring Phys: [phone] JACOB J STINSON IMPRESSIONS  1. Left ventricular ejection fraction, by estimation, is 60 to 65%. The left ventricle has normal function. The left ventricle has no regional wall motion abnormalities. Left ventricular diastolic parameters are indeterminate.  2. Right ventricular systolic function is normal. The right ventricular size is normal. Tricuspid regurgitation signal is inadequate for assessing PA pressure.  3. The mitral valve is normal in structure. No evidence of mitral valve regurgitation.  4. The aortic valve is grossly normal. Aortic valve regurgitation is not visualized.  5. The inferior vena cava is dilated in size with <50% respiratory variability, suggesting right atrial pressure of 15 mmHg. FINDINGS  Left Ventricle: Left ventricular ejection fraction, by estimation, is 60 to 65%. The left ventricle has normal function. The left ventricle has no regional wall motion abnormalities. The left ventricular internal cavity size was normal in size. There is  no left ventricular hypertrophy. Left ventricular diastolic parameters are indeterminate. Right Ventricle: The right ventricular size is normal. Right ventricular systolic function is normal. Tricuspid regurgitation signal is inadequate for assessing PA pressure. Left Atrium: Left atrial size was normal in size. Right Atrium: Right atrial size was normal in size. Pericardium: There is no evidence of pericardial effusion. Mitral Valve: The mitral valve is normal in structure. No evidence of  mitral valve regurgitation. Tricuspid Valve: Tricuspid valve regurgitation is not demonstrated. Aortic Valve: The aortic valve is grossly normal. Aortic valve regurgitation is not visualized. Aortic valve mean gradient measures 5.0 mmHg. Aortic valve peak gradient measures 7.6 mmHg. Aortic valve area, by VTI measures 2.82  cm. Pulmonic Valve: Pulmonic valve regurgitation is not visualized. Aorta: The aortic root and ascending aorta are structurally normal, with no evidence of dilitation. Venous: The inferior vena cava is dilated in size with less than 50% respiratory variability, suggesting right atrial pressure of 15 mmHg. IAS/Shunts: The interatrial septum was not well visualized.  LEFT VENTRICLE PLAX 2D LVIDd:         5.10 cm   Diastology LVIDs:         3.00 cm   LV e' medial:    7.07 cm/s LV PW:         0.80 cm   LV E/e' medial:  12.3 LV IVS:        0.70 cm   LV e' lateral:   10.20 cm/s LVOT diam:     1.80 cm   LV E/e' lateral: 8.5 LV SV:         76 LV SV Index:   41 LVOT Area:     2.54 cm  RIGHT VENTRICLE             IVC RV S prime:     15.10 cm/s  IVC diam: 2.50 cm TAPSE (M-mode): 2.5 cm LEFT ATRIUM             Index        RIGHT ATRIUM           Index LA Vol (A2C):   21.7 ml 11.77 ml/m  RA Area:     14.00 cm LA Vol (A4C):   27.3 ml 14.81 ml/m  RA Volume:   34.60 ml  18.76 ml/m LA Biplane Vol: 24.7 ml 13.40 ml/m  AORTIC VALVE AV Area (Vmax):    2.42 cm AV Area (Vmean):   2.26 cm AV Area (VTI):     2.82 cm AV Vmax:           138.00 cm/s AV Vmean:          102.000 cm/s AV VTI:            0.269 m AV Peak Grad:      7.6 mmHg AV Mean Grad:      5.0 mmHg LVOT Vmax:         131.00 cm/s LVOT Vmean:        90.500 cm/s LVOT VTI:          0.298 m LVOT/AV VTI ratio: 1.11  AORTA Ao Root diam: 3.20 cm Ao Asc diam:  3.50 cm MITRAL VALVE MV Area (PHT): 5.23 cm     SHUNTS MV Decel Time: 145 msec     Systemic VTI:  0.30 m MV E velocity: 87.00 cm/s   Systemic Diam: 1.80 cm MV A velocity: 102.00 cm/s MV E/A ratio:  0.85  Photographer signed by Carolan Clines Signature Date/Time: 02/24/2023/3:44:27 PM    Final    CT Angio Chest PE W and/or Wo Contrast  Result Date: 02/23/2023 CLINICAL DATA:  Shortness of breath, right neck mass, chest mass, headache * Tracking Code: BO * EXAM: CT ANGIOGRAPHY CHEST WITH CONTRAST TECHNIQUE: Multidetector CT imaging of the chest was performed using the standard protocol during bolus administration of intravenous contrast. Multiplanar CT image reconstructions and MIPs were obtained to evaluate the vascular anatomy. RADIATION DOSE REDUCTION: This exam was performed according to the departmental dose-optimization program which includes automated exposure control, adjustment of the mA and/or kV according to patient size and/or use of iterative reconstruction technique. CONTRAST:  75mL OMNIPAQUE IOHEXOL 350  MG/ML SOLN COMPARISON:  None Available. FINDINGS: Cardiovascular: No filling defect is identified in the pulmonary arterial tree to suggest pulmonary embolus. Mild atheromatous vascular calcification of the aortic arch. The large mediastinal mass bulges into the SVC posteriorly for example on image 128 series 4, probably from extrinsic compression, definite invasion is not observed. Because of lack of complete opacification from pulmonary arterial timing, patency of the left brachiocephalic vein is uncertain. Moderate pericardial effusion. Mediastinum/Nodes: Large right infrahilar and hilar mass extending into the mediastinum and tracking all the way to the thoracic inlet. This extends around the right mainstem bronchus, right upper lobe bronchus, and bronchus intermedius as well as the right lower lobe and right middle lobe bronchi, and likewise surrounds and narrows the right pulmonary artery and its branches. In the subcarinal region, the conglomerate mass measures about 7.9 by 5.6 cm (image 144, series 4) and the mass flattens and narrows the SVC without overt occlusion. In the right  infrahilar region, the observed mass measures 4.7 by 3.9 cm on image 179 series 4. Presumed lymph node anterior to the SVC in the upper chest measures 2.6 cm in short axis on image 36 series 3. A right lower neck level IV mass/lymph node measures 3.4 cm in short axis on image 10 series 3. Lungs/Pleura: The mass in the right chest displaces the upper trachea to the left. There is substantial narrowing of the right upper lobe bronchus, right middle lobe bronchus, and severe narrowing of the right lower lobe bronchus due to the surrounding mass. Patchy regions of nodularity in the right lower lobe distal to the mass may represent postobstructive pneumonitis or satellite tumor nodules. Centrilobular emphysema. Scarring or atelectasis in the right middle lobe and right upper lobe anteriorly. Mild dependent atelectasis in both lower lobes. Trace right pleural effusion, image 50 series 3. Upper Abdomen: Small nonspecific hypodense lesion in the left hepatic lobe 0.9 cm in long axis on image 96 series 3. Musculoskeletal: Unremarkable Review of the MIP images confirms the above findings. IMPRESSION: 1. Large right infrahilar and hilar mass extending into the mediastinum and tracking all the way to the thoracic inlet. This extends around the right mainstem bronchus, right upper lobe bronchus, and bronchus intermedius as well as the right lower lobe and right middle lobe bronchi, and likewise surrounds and narrows the right pulmonary artery and its branches. The mass flattens and narrows the SVC without overt occlusion, and is confluent with bulky right level IV neck adenopathy. Top differential diagnostic considerations include lung cancer (such as squamous cell or small cell) versus extensive thoracic and right lower neck lymphoma. 2. Patchy regions of nodularity in the right lower lobe distal to the mass may represent postobstructive pneumonitis or satellite tumor nodules. 3. Presumed lymph node anterior to the SVC in the  upper chest measures 2.6 cm in short axis. 4. A right lower neck level IV mass/lymph node measures 3.4 cm in short axis, compatible with malignancy. 5. Moderate pericardial effusion. 6. Trace right pleural effusion. 7. Centrilobular emphysema. 8. Small nonspecific hypodense lesion in the left hepatic lobe 0.9 cm in long axis. 9. No pulmonary embolus is identified. 10. Aortic atherosclerosis. Aortic Atherosclerosis (ICD10-I70.0) and Emphysema (ICD10-J43.9). Electronically Signed   By: Gaylyn Rong M.D.   On: 02/23/2023 13:39   CT Soft Tissue Neck W Contrast  Result Date: 02/23/2023 CLINICAL DATA:  Neck mass, nonpulsatile right sided neck mass EXAM: CT NECK WITH CONTRAST TECHNIQUE: Multidetector CT imaging of the neck was performed using  the standard protocol following the bolus administration of intravenous contrast. RADIATION DOSE REDUCTION: This exam was performed according to the departmental dose-optimization program which includes automated exposure control, adjustment of the mA and/or kV according to patient size and/or use of iterative reconstruction technique. CONTRAST:  75mL OMNIPAQUE IOHEXOL 350 MG/ML SOLN COMPARISON:  None Available. FINDINGS: Pharynx and larynx: Normal. No mass or swelling. Salivary glands: No inflammation, mass, or stone. Thyroid: Normal. Lymph nodes: There is bulky lower cervical and mediastinal lymphadenopathy involving the right level 3 and level 4 lymph node stations, the right supraclavicular region, and the anterior and posterior mediastinum. Cervical lymphadenopathy significant mass effect on the right internal jugular vein (series 5, image 68). Vascular: Negative. Mass effect on the right internal jugular vein, as above Limited intracranial: Negative. Visualized orbits: Negative. Mastoids and visualized paranasal sinuses: Moderate left-sided mastoid effusion. There is pneumatization of the petrous apices with air-fluid levels. Paranasal sinuses are clear. Orbits are  unremarkable. Skeleton: No acute or aggressive process. Upper chest: Mild paraseptal emphysema. See same day CT chest for additional findings Other: None. IMPRESSION: 1. Bulky right lower cervical and mediastinal lymphadenopathy, concerning for metastatic disease or lymphoma. 2. See separate CT chest for additional findings. Emphysema (ICD10-J43.9). Electronically Signed   By: Lorenza Cambridge M.D.   On: 02/23/2023 13:17   CT Head Wo Contrast  Result Date: 02/23/2023 CLINICAL DATA:  Headache EXAM: CT HEAD WITHOUT CONTRAST TECHNIQUE: Contiguous axial images were obtained from the base of the skull through the vertex without intravenous contrast. RADIATION DOSE REDUCTION: This exam was performed according to the departmental dose-optimization program which includes automated exposure control, adjustment of the mA and/or kV according to patient size and/or use of iterative reconstruction technique. COMPARISON:  None Available. FINDINGS: Brain: There is no acute intracranial hemorrhage, extra-axial fluid collection, or acute infarct Parenchymal volume is normal. The ventricles are normal in size. Gray-white differentiation is preserved The pituitary and suprasellar region are normal. There is no mass lesion. There is no mass effect or midline shift. Vascular: No hyperdense vessel or unexpected calcification. Skull: Normal. Negative for fracture or focal lesion. Sinuses/Orbits: The imaged paranasal sinuses are clear. The globes and orbits are unremarkable. Other: There are small bilateral mastoid effusions. IMPRESSION: 1. No acute intracranial pathology. 2. Small bilateral mastoid effusions. Electronically Signed   By: Lesia Hausen M.D.   On: 02/23/2023 13:15   DG Chest Port 1 View  Result Date: 02/23/2023 CLINICAL DATA:  Sepsis EXAM: PORTABLE CHEST 1 VIEW COMPARISON:  X-ray 01/09/2023 FINDINGS: No pneumothorax, effusion or edema. Calcified aorta. Overlapping cardiac leads. There is new widening of the mediastinum  compared to prior x-ray and fullness of the right lung hilum. Recommend further evaluation with CT to delineate for potential mass. IMPRESSION: New widening of the mediastinum with masslike fullness in the right lung hilum. Recommend follow up contrast CT to further delineate Electronically Signed   By: Karen Kays M.D.   On: 02/23/2023 13:11

## 2023-03-08 NOTE — Progress Notes (Signed)
PROGRESS NOTE  Ana Curry    DOB: [birth year], 52 y.o.  WGN:[id number]    Code Status: Full Code   DOA: 03/06/2023   LOS: 2   Brief hospital course  Ana Curry is a 52 y.o. 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  They presented from home to the ED on 03/06/2023 with SOB and pain x several days. Patient just discharged from hospital after prolonged admission from 6/14-6/24.  She was diagnosed with small cell carcinoma of the right lung with extension of right infrahilar, right mainstem bronchus, right upper lobe and thoracic lymphadenopathy.     Diagnosis was confirmed with cervical node biopsy 6/17.  CT abdomen/pelvis and MRI brain did not show any metastatic disease.  Bone scan showed subtle asymmetric uptake involving proximal left tibia. Bone marrow biopsy 6/21 has now resulted and consistent with metastatic small cell carcinoma. findings discussed with patient on admission.   Patient was started on chemotherapy with first cycle on 6/21.  She underwent radiation treatment on 6/21 and 6/24.   During hospitalization she developed A-fib with RVR initially treated with Cardizem and amiodarone drip.  She was transitioned to oral amiodarone.  Noted to have severe thrombocytopenia.  She was seen by cardiology who did not recommend anticoagulation due to thrombocytopenia.  She was seen in radiation clinic day after dc.She was noted to be ill-appearing, hypotensive and tachycardic, and short of breath.  She was sent to the ED for further evaluation.   Patient has been generally weak, difficulty walking due to to significant fatigue.  She has not fallen or lost consciousness.  She has been short of breath, worse when laying flat.  She has been experiencing pain all over her body which is relatively new.  She has not  had any significant nausea, vomiting, abdominal pain.  Appetite has been okay.  In the ED, it was found that they had BP 97/68, pulse 101, RR 22, temp 98.2 F, SpO2 93% on 3 L O2 via [place].  Significant findings included WBC 4.7, hemoglobin 10.0, platelets 33,000, sodium 128, potassium 3.6, bicarb 26, BUN 17, creatinine 0.76, serum glucose 97. UA negative. CT chest/abdomen/pelvis obtained. No significant change in size of extensive confluent lymphadenopathy extending from right supraclavicular region through the mediastinum and right hilum. Stable extrinsic compression of the right internal jugular vein, left brachiocephalic vein, and SVC s/p Port-A-Cath placement. No evidence of large vessel occlusion or intravascular thrombus. No acute findings or evidence of metastatic disease in the abdomen or pelvis. Mildly increased moderate right and small left pleural effusions noted with increased compressive atelectasis in the right middle lobe. Additionally increased patchy groundglass opacities throughout both lungs noted. Stable moderate pericardial effusion seen.   They were initially treated with 1 L LR, IV morphine, Dilaudid, fentanyl, Zofran   Patient was admitted to medicine service for further workup and management of dyspnea as outlined in detail below.  Patient's respiratory status is precarious in setting of her known carcinoma as well as growing effusion but getting thoracentesis to drain the effusion is on hold due to her thrombocytopenia. She is also having increased pain from her metastatic disease. Given her acute worsening of her functional status, patient is now likely to dc to SNF.  Assessment & Plan  Principal Problem:   Acute respiratory failure with hypoxia (HCC) Active Problems:  Thrombocytopenia (HCC)   Small cell carcinoma of lung, right (HCC)   SVC syndrome   Paroxysmal atrial fibrillation (HCC)   Cancer associated pain   Generalized weakness   Hypoxia   Pleural  effusion  Small cell carcinoma of right lung with extension of right infrahilar, right mainstem bronchus, right upper lobe and thoracic inlet adenopathy: She has been started on chemotherapy and radiation during recent hospitalization. -Bone biopsy 6/21. consistent with metastatic small cell carcinoma.  These results were discussed with patient on admission -management per heme/onc  - will continue radiation as platelets allow. - immunotherapy on hold  - PT/OT evaluation for support evaluation. May need placement - [facility] consult for placement, equipment needed   Acute respiratory failure with hypoxia- had been recently discharged with oxygen. On 6L [place] Bilateral pleural effusions- R worse than left and worsened since last admission when was not amenable to thoracentesis. ncreased from prior imaging.   - IR consult  - Can be reevaluated for thoracentesis.  - platelet transfusion today. Per IR, platelets at least 30 prior to thoracentesis -Continue DuoNebs and albuterol as needed -Continue supplemental O2 as needed  Moderate pericardial effusion: Stable pericardial effusion noted on CT.   Thrombocytopenia: platelets 33>31>27. No active bleeding. Likely secondary to malignancy, treatment reaction. -monitor - platelet transfusion today   SVC syndrome: Secondary to cancer, stable findings on repeat CT imaging.  Has been undergoing radiation treatment.Neck and face swelling- R worse than L. - lymphatic massage   Hyponatremia- improving. Na [id number]. S/p 1 L fluids given in the ED.  - continue MIVF - monitor am   Paroxysmal atrial fibrillation: Developed A-fib RVR on recent admission.  Currently stable.  Continue amiodarone.  Not on anticoagulation due to thrombocytopenia.   Cancer associated pain: Worsened pain throughout whole body.  Will start OxyContin 10 mg BID.  IV Dilaudid as needed.  Opiate related constipation- describes over a week since last BM. Declines suppository or  enema.  - continue miralax, senna  Body mass index is 25.85 kg/m.  VTE ppx: SCDs Start: 03/06/23 1947  Diet:     Diet   Diet regular Fluid consistency: Thin   Consultants: Heme/onc IR  Subjective 03/08/23    Pt reports worsened swelling of right eye and neck. Also having fullness sensation in ears. Respiratory status is stable. Still no BM. Continues to endorse increasing pain in back.     Objective   Vitals:   03/07/23 1518 03/07/23 1937 03/07/23 2333 03/08/23 0334  BP: (!) 101/58 110/64 111/65 114/69  Pulse: (!) 110 (!) 117 (!) 113 (!) 110  Resp: (!) 22 (!) 24 (!) 22   Temp: 98 F (36.7 C) 98 F (36.7 C) 98.3 F (36.8 C) 98.6 F (37 C)  TempSrc: Oral Oral Oral Oral  SpO2: 93% 91% 93% 94%  Weight:      Height:        Intake/Output Summary (Last 24 hours) at 03/08/2023 0821 Last data filed at 03/08/2023 0100 Gross per 24 hour  Intake 1562.88 ml  Output 650 ml  Net 912.88 ml    Filed Weights   03/06/23 1320 03/06/23 2315  Weight: 71.2 kg 77.1 kg    Physical Exam:  General: awake, alert, in mild distress HEENT: atraumatic, clear conjunctiva, anicteric sclera, MMM, hearing grossly normal Swelling of right eye- no pain with eye movement.  Soft tissue selling on right neck/shoulder Respiratory:increased respiratory effort. Cardiovascular: quick capillary refill, normal S1/S2, RRR, no JVD, murmurs Gastrointestinal: soft, mild  tender Nervous: A&O x3. no gross focal neurologic deficits, normal speech Extremities: moves all equally, no edema, normal tone Skin: dry, intact, normal temperature, normal color. No rashes, lesions or ulcers on exposed skin Psychiatry: anxious mood, congruent affect  Labs   I have personally reviewed the following labs and imaging studies CBC    Component Value Date/Time   WBC 2.9 (L) 03/08/2023 0323   RBC 2.75 (L) 03/08/2023 0323   HGB 8.8 (L) 03/08/2023 0323   HCT 26.6 (L) 03/08/2023 0323   PLT 27 (LL) 03/08/2023 0323   MCV  96.7 03/08/2023 0323   MCH 32.0 03/08/2023 0323   MCHC 33.1 03/08/2023 0323   RDW 14.7 03/08/2023 0323   LYMPHSABS 1.1 03/08/2023 0323   MONOABS 0.0 (L) 03/08/2023 0323   EOSABS 0.0 03/08/2023 0323   BASOSABS 0.0 03/08/2023 0323      Latest Ref Rng & Units 03/08/2023    3:23 AM 03/07/2023    5:25 AM 03/06/2023    4:14 PM  BMP  Glucose 70 - 99 mg/dL 696  295  97   BUN 6 - 20 mg/dL 16  19  17    Creatinine 0.44 - 1.00 mg/dL 2.84  1.32  4.40   Sodium 135 - 145 mmol/L 134  130  128   Potassium 3.5 - 5.1 mmol/L 3.7  3.6  3.6   Chloride 98 - 111 mmol/L 98  95  95   CO2 22 - 32 mmol/L 27  25  26    Calcium 8.9 - 10.3 mg/dL 9.0  9.2  8.9     CT Chest W Contrast  Result Date: 03/06/2023 CLINICAL DATA:  Small cell lung cancer. Increasing back pain and extreme fatigue. 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 * Tracking Code: BO * EXAM: CT CHEST, ABDOMEN, AND PELVIS WITH CONTRAST TECHNIQUE: Multidetector CT imaging of the chest, abdomen and pelvis was performed following the standard protocol during bolus administration of intravenous contrast. RADIATION DOSE REDUCTION: This exam was performed according to the departmental dose-optimization program which includes automated exposure control, adjustment of the mA and/or kV according to patient size and/or use of iterative reconstruction technique. CONTRAST:  OMNIPAQUE IOHEXOL 300 MG/ML  SOLN COMPARISON:  Chest CT 02/23/2023.  Abdominopelvic CT 02/27/2023. FINDINGS: CT CHEST FINDINGS Cardiovascular: A left subclavian Port-A-Cath has been placed, extending into the superior aspect of the right atrium. There is significant extrinsic compression of the right internal jugular, left brachiocephalic vein and SVC, similar to previous CT. There is also extrinsic mass effect on the right pulmonary artery and veins, also similar. No large vessel occlusion or intravascular thrombus identified. The heart size is stable. A moderate size pericardial effusion appears  unchanged. Mediastinum/Nodes: Again demonstrated is extensive confluent lymphadenopathy extending from the right supraclavicular region through the mediastinum into the right hilum. There is a right supraclavicular component which is incompletely visualized, measuring approximately 4.9 x 3.7 cm on image 1/2. There is a right paratracheal component measuring 4.8 x 4.6 cm, and this has a 3.4 x 2.7 cm component extending anterior to the brachiocephalic veins on images 18/2. Overall, these lymph nodes are similar in size to the prior chest CT, although demonstrate lower central density suggesting response to treatment and necrosis. No progressive adenopathy identified. Stable extrinsic narrowing of the central right-sided airways without occlusion. The esophagus appears unremarkable. Lungs/Pleura: New moderate right and small left pleural effusions (compared with prior chest CT; minimally enlarged compared with more recent abdominal CT). Associated increased compressive atelectasis in  the right middle lobe. There are additional increased patchy ground-glass opacities throughout both lungs which may reflect edema or posterior obstructive pneumonitis. There is asymmetric central airway thickening on the right. Underlying mild to moderate centrilobular emphysema. No pneumothorax. Musculoskeletal/Chest wall: No chest wall mass or suspicious osseous findings. CT ABDOMEN AND PELVIS FINDINGS Hepatobiliary: The liver is normal in density without suspicious focal abnormality. Unchanged 9 mm low-density lesion in the left hepatic lobe on image 56/2, likely a cyst. No evidence of gallstones, gallbladder wall thickening or biliary dilatation. Pancreas: Unremarkable. No pancreatic ductal dilatation or surrounding inflammatory changes. Spleen: Normal in size without focal abnormality. Adrenals/Urinary Tract: Both adrenal glands appear normal. No evidence of urinary tract calculus, suspicious renal lesion or hydronephrosis. The  bladder appears normal for its degree of distention. Stomach/Bowel: No enteric contrast administered. The stomach appears unremarkable for its degree of distension. No evidence of bowel wall thickening, distention or surrounding inflammatory change. Prominent stool throughout the colon. Vascular/Lymphatic: There are no enlarged abdominal or pelvic lymph nodes. Aortic and branch vessel atherosclerosis without evidence of aneurysm or large vessel occlusion. Reproductive: The uterus and ovaries appear normal. No adnexal mass. Other: No evidence of abdominal wall mass or hernia. No ascites or pneumoperitoneum. Musculoskeletal: No acute or significant osseous findings. IMPRESSION: 1. No significant change in size of extensive confluent lymphadenopathy extending from the right supraclavicular region through the mediastinum and right hilum. The lymph nodes demonstrate lower central density suggesting response to treatment and necrosis. 2. Grossly stable extrinsic compression of the right internal jugular vein, left brachiocephalic vein and SVC status post Port-A-Cath placement. These findings may contribute to symptoms of SVC syndrome. No evidence of large vessel occlusion or intravascular thrombus. 3. No acute findings or evidence of metastatic disease in the abdomen or pelvis. 4. Mildly increased moderate right and small left pleural effusions with increased compressive atelectasis in the right middle lobe. There are additional increased patchy ground-glass opacities throughout both lungs which may reflect edema or posterior obstructive pneumonitis. 5. Stable moderate pericardial effusion. 6. Aortic Atherosclerosis (ICD10-I70.0) and Emphysema (ICD10-J43.9). Electronically Signed   By: Carey Bullocks M.D.   On: 03/06/2023 18:17   CT ABDOMEN PELVIS W CONTRAST  Result Date: 03/06/2023 CLINICAL DATA:  Small cell lung cancer. Increasing back pain and extreme fatigue. 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 * Tracking Code:  BO * EXAM: CT CHEST, ABDOMEN, AND PELVIS WITH CONTRAST TECHNIQUE: Multidetector CT imaging of the chest, abdomen and pelvis was performed following the standard protocol during bolus administration of intravenous contrast. RADIATION DOSE REDUCTION: This exam was performed according to the departmental dose-optimization program which includes automated exposure control, adjustment of the mA and/or kV according to patient size and/or use of iterative reconstruction technique. CONTRAST:  OMNIPAQUE IOHEXOL 300 MG/ML  SOLN COMPARISON:  Chest CT 02/23/2023.  Abdominopelvic CT 02/27/2023. FINDINGS: CT CHEST FINDINGS Cardiovascular: A left subclavian Port-A-Cath has been placed, extending into the superior aspect of the right atrium. There is significant extrinsic compression of the right internal jugular, left brachiocephalic vein and SVC, similar to previous CT. There is also extrinsic mass effect on the right pulmonary artery and veins, also similar. No large vessel occlusion or intravascular thrombus identified. The heart size is stable. A moderate size pericardial effusion appears unchanged. Mediastinum/Nodes: Again demonstrated is extensive confluent lymphadenopathy extending from the right supraclavicular region through the mediastinum into the right hilum. There is a right supraclavicular component which is incompletely visualized, measuring approximately 4.9 x 3.7 cm on image  1/2. There is a right paratracheal component measuring 4.8 x 4.6 cm, and this has a 3.4 x 2.7 cm component extending anterior to the brachiocephalic veins on images 18/2. Overall, these lymph nodes are similar in size to the prior chest CT, although demonstrate lower central density suggesting response to treatment and necrosis. No progressive adenopathy identified. Stable extrinsic narrowing of the central right-sided airways without occlusion. The esophagus appears unremarkable. Lungs/Pleura: New moderate right and small left pleural  effusions (compared with prior chest CT; minimally enlarged compared with more recent abdominal CT). Associated increased compressive atelectasis in the right middle lobe. There are additional increased patchy ground-glass opacities throughout both lungs which may reflect edema or posterior obstructive pneumonitis. There is asymmetric central airway thickening on the right. Underlying mild to moderate centrilobular emphysema. No pneumothorax. Musculoskeletal/Chest wall: No chest wall mass or suspicious osseous findings. CT ABDOMEN AND PELVIS FINDINGS Hepatobiliary: The liver is normal in density without suspicious focal abnormality. Unchanged 9 mm low-density lesion in the left hepatic lobe on image 56/2, likely a cyst. No evidence of gallstones, gallbladder wall thickening or biliary dilatation. Pancreas: Unremarkable. No pancreatic ductal dilatation or surrounding inflammatory changes. Spleen: Normal in size without focal abnormality. Adrenals/Urinary Tract: Both adrenal glands appear normal. No evidence of urinary tract calculus, suspicious renal lesion or hydronephrosis. The bladder appears normal for its degree of distention. Stomach/Bowel: No enteric contrast administered. The stomach appears unremarkable for its degree of distension. No evidence of bowel wall thickening, distention or surrounding inflammatory change. Prominent stool throughout the colon. Vascular/Lymphatic: There are no enlarged abdominal or pelvic lymph nodes. Aortic and branch vessel atherosclerosis without evidence of aneurysm or large vessel occlusion. Reproductive: The uterus and ovaries appear normal. No adnexal mass. Other: No evidence of abdominal wall mass or hernia. No ascites or pneumoperitoneum. Musculoskeletal: No acute or significant osseous findings. IMPRESSION: 1. No significant change in size of extensive confluent lymphadenopathy extending from the right supraclavicular region through the mediastinum and right hilum. The  lymph nodes demonstrate lower central density suggesting response to treatment and necrosis. 2. Grossly stable extrinsic compression of the right internal jugular vein, left brachiocephalic vein and SVC status post Port-A-Cath placement. These findings may contribute to symptoms of SVC syndrome. No evidence of large vessel occlusion or intravascular thrombus. 3. No acute findings or evidence of metastatic disease in the abdomen or pelvis. 4. Mildly increased moderate right and small left pleural effusions with increased compressive atelectasis in the right middle lobe. There are additional increased patchy ground-glass opacities throughout both lungs which may reflect edema or posterior obstructive pneumonitis. 5. Stable moderate pericardial effusion. 6. Aortic Atherosclerosis (ICD10-I70.0) and Emphysema (ICD10-J43.9). Electronically Signed   By: Carey Bullocks M.D.   On: 03/06/2023 18:17   DG Chest Portable 1 View  Result Date: 03/06/2023 CLINICAL DATA:  Fatigue and weakness.  On chemotherapy. EXAM: PORTABLE CHEST 1 VIEW COMPARISON:  Chest x-ray 02/28/2023 FINDINGS: Hyperinflation. Small right effusion with adjacent opacity similar to previous. Stable interstitial prominence. Stable cardiopericardial silhouette. No pneumothorax. Again fullness of the right lung hilum. Left IJ chest port in place tip at the SVC right atrial junction. The port is accessed. IMPRESSION: Persistent right-sided effusion adjacent lung opacity with fullness of the right side of the lung hilum and widening of the mediastinum. Hyperinflation. Chest port Electronically Signed   By: Karen Kays M.D.   On: 03/06/2023 16:50    Disposition Plan & Communication  Patient status: Inpatient  Admitted From: Home Planned disposition location:  TBD Anticipated discharge date: 6/30 pending respiratory state improvement  Family Communication: none at bedside    Author: Leeroy Bock, DO [facility] 03/08/2023, 8:21 AM    Available by Epic secure chat 7AM-7PM. If 7PM-7AM, please contact night-coverage.  [facility] contact information found on [email or web address].

## 2023-03-08 NOTE — Progress Notes (Signed)
LM for pt to let her know we'd hold radiation today based on PLT count. Anticipate resuming tomorrow if platelets increase above 40K.

## 2023-03-08 NOTE — TOC Progression Note (Signed)
Transition of Care [facility]) - Progression Note    Patient Details  Name: LUDIE HUDON MRN: [id number] Date of Birth: 03/29/1971  Transition of Care [facility]) CM/SW Contact  Coralyn Helling, [place] Phone Number: 03/08/2023, 11:16 AM  Clinical Narrative:     Transition of Care [facility]) - Inpatient Brief Assessment   Patient Details  Name: KHLOEY CHERN MRN: [id number] Date of Birth: 1971/08/18  Transition of Care [facility]) CM/SW Contact:    Coralyn Helling, LCSW Phone Number: 03/08/2023, 11:16 AM   Clinical Narrative:  TOC will follow for possible needs. Patient dc on 6/24 with o2 and DME.    Transition of Care Asessment: Insurance and Status: Insurance coverage has been reviewed Patient has primary care physician: Yes Home environment has been reviewed: Home alone Prior level of function:: independent Prior/Current Home Services: No current home services Social Determinants of Health Reivew: SDOH reviewed no interventions necessary Readmission risk has been reviewed: Yes Transition of care needs: transition of care needs identified, TOC will continue to follow        Expected Discharge Plan and Services                                               Social Determinants of Health (SDOH) Interventions SDOH Screenings   Food Insecurity: No Food Insecurity (03/06/2023)  Housing: Low Risk  (03/06/2023)  Transportation Needs: No Transportation Needs (03/06/2023)  Utilities: Not At Risk (03/06/2023)  Tobacco Use: High Risk (03/06/2023)    Readmission Risk Interventions    03/07/2023    1:15 PM  Readmission Risk Prevention Plan  Transportation Screening Complete  Home Care Screening Complete  Medication Review (RN CM) Complete

## 2023-03-08 NOTE — Evaluation (Addendum)
Physical Therapy Evaluation Patient Details Name: Ana Curry MRN: [id number] DOB: 1971/09/05 Today's Date: 03/08/2023  History of Present Illness  Ana Curry is a 52 y.o. 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    Patient presented from home to the ED on 03/06/2023 with SOB and pain. Patient just discharged from hospital 03/05/23 after prolonged admission . patient has  recent history of left foot fracture, has CAm boot of post op shoe.  Clinical Impression  Pt admitted with above diagnosis.  Pt currently with functional limitations due to the deficits listed below (see PT Problem List). Pt will benefit from acute skilled PT to increase their independence and safety with mobility to allow discharge.   The patient reports feeling anxious and requesting medication for pain and anxiety.  Patient reports  that she was home 1 day and unable to ambulate due to SOB. Patient had been ambulatory  prior to last DC on 03/05/23. Patient currently on HFNC at 7 LPM(removed several extensions). Patient did ambulate 5' forward and backward from bed x 3. Dyspnea 4/4, Spo2 87%, HR125.  Continue PT for mobility and monitor SPO2. Encouraged patient to call for assistance  to get to [facility].        Recommendations for follow up therapy are one component of a multi-disciplinary discharge planning process, led by the attending physician.  Recommendations may be updated based on patient status, additional functional criteria and insurance authorization.  Follow Up Recommendations Can patient physically be transported by private vehicle: No     Assistance Recommended at Discharge Set up Supervision/Assistance  Patient can return home with the following  A little help with walking and/or transfers;A little help with  bathing/dressing/bathroom;Assistance with cooking/housework;Assist for transportation;Help with stairs or ramp for entrance    Equipment Recommendations None recommended by PT  Recommendations for Other Services  OT consult    Functional Status Assessment Patient has had a recent decline in their functional status and demonstrates the ability to make significant improvements in function in a reasonable and predictable amount of time.     Precautions / Restrictions Precautions Precautions: Fall Precaution Comments: low platelets, on O2 Required Braces or Orthoses: Other Brace Other Brace: right foot post op shoe in room, states she has a Cam boot.      Mobility  Bed Mobility Overal bed mobility: Independent                  Transfers Overall transfer level: Needs assistance Equipment used: Rolling walker (2 wheels) Transfers: Sit to/from Stand Sit to Stand: Min guard           General transfer comment: patient  leaning on RW, decreased WB on the Left foot    Ambulation/Gait Ambulation/Gait assistance: Min assist Gait Distance (Feet): 5 Feet (forwards and backwards x 3 due to IV pole stuck.) Assistive device: Rolling walker (2 wheels) Gait Pattern/deviations: Step-to pattern, Antalgic Gait velocity: decr     General Gait Details: patient leaning over forward on RW, reporting back pain  Stairs            Wheelchair Mobility    Modified Rankin (Stroke Patients Only)       Balance Overall balance assessment: Needs assistance Sitting-balance support: No upper extremity supported, Feet supported Sitting balance-Leahy Scale: Fair     Standing balance support: Bilateral upper  extremity supported, During functional activity, Reliant on assistive device for balance Standing balance-Leahy Scale: Poor                               Pertinent Vitals/Pain Pain Assessment Pain Assessment: 0-10 Pain Score: 8  Pain Location: left side of  back Pain Descriptors / Indicators: Constant, Cramping, Discomfort Pain Intervention(s): Patient requesting pain meds-RN notified, Limited activity within patient's tolerance, Monitored during session    Home Living Family/patient expects to be discharged to:: Private residence Living Arrangements: Alone Available Help at Discharge: Family;Friend(s);Available PRN/intermittently Type of Home: House Home Access: Stairs to enter   [employer] of Steps: 3-4   Home Layout: One level Home Equipment: Shower seat;BSC/3in1 Additional Comments: father and sister have been assisting    Prior Function Prior Level of Function : Needs assist       Physical Assist : Mobility (physical)     Mobility Comments: limited ambulation at DC as was home only 1 day before readmission , using RW ADLs Comments: requires assist     Hand Dominance        Extremity/Trunk Assessment   Upper Extremity Assessment Upper Extremity Assessment: Overall WFL for tasks assessed    Lower Extremity Assessment Lower Extremity Assessment: Overall WFL for tasks assessed;RLE deficits/detail RLE Deficits / Details: H/O foot fracture       Communication      Cognition Arousal/Alertness: Awake/alert Behavior During Therapy: Anxious Overall Cognitive Status: Within Functional Limits for tasks assessed                                          General Comments      Exercises     Assessment/Plan    PT Assessment Patient needs continued PT services  PT Problem List Decreased activity tolerance;Decreased mobility;Decreased safety awareness;Cardiopulmonary status limiting activity;Decreased knowledge of precautions;Pain       PT Treatment Interventions DME instruction;Therapeutic activities;Gait training;Functional mobility training;Therapeutic exercise;Patient/family education    PT Goals (Current goals can be found in the Care Plan section)  Acute Rehab PT Goals Patient  Stated Goal: to get better and go home PT Goal Formulation: With patient Time For Goal Achievement: 03/22/23 Potential to Achieve Goals: Fair    Frequency Min 1X/week     Co-evaluation               AM-PAC PT "6 Clicks" Mobility  Outcome Measure Help needed turning from your back to your side while in a flat bed without using bedrails?: None Help needed moving from lying on your back to sitting on the side of a flat bed without using bedrails?: None Help needed moving to and from a bed to a chair (including a wheelchair)?: A Little Help needed standing up from a chair using your arms (e.g., wheelchair or bedside chair)?: A Little Help needed to walk in hospital room?: Total Help needed climbing 3-5 steps with a railing? : Total 6 Click Score: 16    End of Session   Activity Tolerance: Patient limited by pain;Treatment limited secondary to medical complications (Comment) (SOB) Patient left: in bed;with call bell/phone within reach;with nursing/sitter in room Nurse Communication: Mobility status PT Visit Diagnosis: Unsteadiness on feet (R26.81);Difficulty in walking, not elsewhere classified (R26.2);Pain    Time: 4098-1191 PT Time Calculation (min) (ACUTE ONLY): 38 min  Charges:     PT Treatments $Gait Training: 8-22 mins $Self Care/Home Management: 8-22        Blanchard Kelch PT Acute Rehabilitation Services Office [phone] Weekend pager-[phone]   Rada Hay 03/08/2023, 1:30 PM

## 2023-03-09 ENCOUNTER — Other Ambulatory Visit: Payer: Self-pay

## 2023-03-09 ENCOUNTER — Inpatient Hospital Stay (HOSPITAL_COMMUNITY): Payer: Medicaid Other

## 2023-03-09 ENCOUNTER — Ambulatory Visit
Admission: RE | Admit: 2023-03-09 | Discharge: 2023-03-09 | Disposition: A | Discharge status: Home / Self Care | Payer: Medicaid Other | Source: Ambulatory Visit | Attending: Radiation Oncology | Admitting: Radiation Oncology

## 2023-03-09 ENCOUNTER — Encounter: Payer: Self-pay | Admitting: Hematology

## 2023-03-09 DIAGNOSIS — J9601 Acute respiratory failure with hypoxia: Secondary | ICD-10-CM | POA: Diagnosis not present

## 2023-03-09 DIAGNOSIS — K5909 Other constipation: Secondary | ICD-10-CM | POA: Insufficient documentation

## 2023-03-09 LAB — CBC WITH DIFFERENTIAL/PLATELET
Abs Immature Granulocytes: 0.04 10*3/uL (ref 0.00–0.07)
Basophils Absolute: 0 10*3/uL (ref 0.0–0.1)
Basophils Relative: 1 %
Eosinophils Absolute: 0 10*3/uL (ref 0.0–0.5)
Eosinophils Relative: 1 %
HCT: 25.9 % — ABNORMAL LOW (ref 36.0–46.0)
Hemoglobin: 8.7 g/dL — ABNORMAL LOW (ref 12.0–15.0)
Immature Granulocytes: 2 %
Lymphocytes Relative: 42 %
Lymphs Abs: 0.8 10*3/uL (ref 0.7–4.0)
MCH: 32.3 pg (ref 26.0–34.0)
MCHC: 33.6 g/dL (ref 30.0–36.0)
MCV: 96.3 fL (ref 80.0–100.0)
Monocytes Absolute: 0 10*3/uL — ABNORMAL LOW (ref 0.1–1.0)
Monocytes Relative: 2 %
Neutro Abs: 1.1 10*3/uL — ABNORMAL LOW (ref 1.7–7.7)
Neutrophils Relative %: 52 %
Platelets: 40 10*3/uL — ABNORMAL LOW (ref 150–400)
RBC: 2.69 MIL/uL — ABNORMAL LOW (ref 3.87–5.11)
RDW: 14.7 % (ref 11.5–15.5)
WBC: 2 10*3/uL — ABNORMAL LOW (ref 4.0–10.5)
nRBC: 0 % (ref 0.0–0.2)

## 2023-03-09 LAB — RAD ONC ARIA SESSION SUMMARY
Course Elapsed Days: 7
Plan Fractions Treated to Date: 2
Plan Prescribed Dose Per Fraction: 2.5 Gy
Plan Total Fractions Prescribed: 13
Plan Total Prescribed Dose: 32.5 Gy
Reference Point Dosage Given to Date: 5 Gy
Reference Point Session Dosage Given: 2.5 Gy
Session Number: 3

## 2023-03-09 LAB — BODY FLUID CELL COUNT WITH DIFFERENTIAL
Eos, Fluid: 0 %
Lymphs, Fluid: 62 %
Monocyte-Macrophage-Serous Fluid: 18 % — ABNORMAL LOW (ref 50–90)
Neutrophil Count, Fluid: 20 % (ref 0–25)
Total Nucleated Cell Count, Fluid: 72 cu mm (ref 0–1000)

## 2023-03-09 LAB — PREPARE PLATELET PHERESIS: Unit division: 0

## 2023-03-09 LAB — BASIC METABOLIC PANEL
Anion gap: 11 (ref 5–15)
BUN: 15 mg/dL (ref 6–20)
CO2: 25 mmol/L (ref 22–32)
Calcium: 9.3 mg/dL (ref 8.9–10.3)
Chloride: 96 mmol/L — ABNORMAL LOW (ref 98–111)
Creatinine, Ser: 0.82 mg/dL (ref 0.44–1.00)
GFR, Estimated: >60 mL/min (ref >60)
Glucose, Bld: 108 mg/dL — ABNORMAL HIGH (ref 70–99)
Potassium: 3.8 mmol/L (ref 3.5–5.1)
Sodium: 132 mmol/L — ABNORMAL LOW (ref 135–145)

## 2023-03-09 LAB — BPAM PLATELET PHERESIS: ISSUE DATE / TIME: 202406272136

## 2023-03-09 MED ORDER — DIAZEPAM 5 MG PO TABS
10.0000 mg | ORAL_TABLET | Freq: Two times a day (BID) | ORAL | Status: DC
  Administered 2023-03-09 – 2023-03-14 (×10): 10 mg via ORAL
  Filled 2023-03-09 (×10): qty 2

## 2023-03-09 MED ORDER — OXYCODONE HCL 5 MG PO TABS
5.0000 mg | ORAL_TABLET | Freq: Four times a day (QID) | ORAL | Status: DC | PRN
  Administered 2023-03-09 – 2023-03-10 (×2): 5 mg via ORAL
  Filled 2023-03-09 (×2): qty 1

## 2023-03-09 MED ORDER — TBO-FILGRASTIM 300 MCG/0.5ML ~~LOC~~ SOSY
300.0000 ug | PREFILLED_SYRINGE | Freq: Every day | SUBCUTANEOUS | Status: DC
  Administered 2023-03-09 – 2023-03-14 (×6): 300 ug via SUBCUTANEOUS
  Filled 2023-03-09 (×6): qty 0.5

## 2023-03-09 MED ORDER — LACTULOSE 10 GM/15ML PO SOLN
10.0000 g | Freq: Three times a day (TID) | ORAL | Status: DC
  Administered 2023-03-09 – 2023-03-14 (×11): 10 g via ORAL
  Filled 2023-03-09 (×13): qty 15

## 2023-03-09 MED ORDER — LIDOCAINE HCL 1 % IJ SOLN
INTRAMUSCULAR | Status: AC
  Filled 2023-03-09: qty 20

## 2023-03-09 MED ORDER — SENNOSIDES-DOCUSATE SODIUM 8.6-50 MG PO TABS
1.0000 | ORAL_TABLET | Freq: Two times a day (BID) | ORAL | Status: DC
  Administered 2023-03-09 – 2023-03-14 (×9): 1 via ORAL
  Filled 2023-03-09 (×10): qty 1

## 2023-03-09 NOTE — Evaluation (Signed)
Occupational Therapy Evaluation Patient Details Name: Ana Curry MRN: [id number] DOB: 1971-05-11 Today's Date: 03/09/2023   History of Present Illness Ana Curry is a 52 y.o. 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    Patient presented from home to the ED on 03/06/2023 with SOB and pain. Patient just discharged from hospital 03/05/23 after prolonged admission . patient has  recent history of left foot fracture, has CAm boot of post op shoe.   Clinical Impression   Eval limited to bed level as pt in pain and not feeling well although premedicated. Pt demonstrates a decline in functional status requiring assistance with mobility and ADL tasks due to below listed deficits. Patient will benefit from continued inpatient follow up therapy, <3 hours/day. Acute OT to follow.  Pt was connected to O2 tank on bed. SpO2 65 with good wave form. Pt placed on 7L HFNC and SpO2 returned to 90. Nsg made aware.      Recommendations for follow up therapy are one component of a multi-disciplinary discharge planning process, led by the attending physician.  Recommendations may be updated based on patient status, additional functional criteria and insurance authorization.   Assistance Recommended at Discharge Frequent or constant Supervision/Assistance  Patient can return home with the following A lot of help with bathing/dressing/bathroom;A little help with walking and/or transfers;Assistance with cooking/housework;Direct supervision/assist for medications management;Direct supervision/assist for financial management;Assist for transportation;Help with stairs or ramp for entrance    Functional Status Assessment  Patient has had a recent decline in their functional status and demonstrates the ability to make significant improvements  in function in a reasonable and predictable amount of time.  Equipment Recommendations  BSC/3in1    Recommendations for Other Services       Precautions / Restrictions Precautions Precautions: Fall Precaution Comments: low platelets, on O2 Required Braces or Orthoses: Other Brace Other Brace: right foot post op shoe in room, states she has a Cam boot. Restrictions Weight Bearing Restrictions: No      Mobility Bed Mobility Overal bed mobility: Needs Assistance             General bed mobility comments: limited due to pain    Transfers                   General transfer comment: miguard with PT; not able to attmept today      Balance                                           ADL either performed or assessed with clinical judgement   ADL Overall ADL's : Needs assistance/impaired     Grooming: Set up   Upper Body Bathing: Minimal assistance   Lower Body Bathing: Moderate assistance   Upper Body Dressing : Minimal assistance   Lower Body Dressing: Moderate assistance   Toilet Transfer: Minimal assistance   Toileting- Clothing Manipulation and Hygiene: Moderate assistance       Functional mobility during ADLs: Minimal assistance General ADL Comments: significantly limited by fatigue; pt reports she [facility] wtih  "al ittle" assistance from staff     Vision         Perception     Praxis  Pertinent Vitals/Pain Pain Assessment Pain Assessment: 0-10 Pain Score: 10-Worst pain ever Breathing: normal Negative Vocalization: none Facial Expression: smiling or inexpressive Body Language: relaxed Consolability: no need to console PAINAD Score: 0 Pain Location: left side of back Pain Descriptors / Indicators: Constant, Cramping, Discomfort Pain Intervention(s): Limited activity within patient's tolerance     Hand Dominance Right   Extremity/Trunk Assessment Upper Extremity Assessment Upper  Extremity Assessment: Generalized weakness   Lower Extremity Assessment Lower Extremity Assessment: Defer to PT evaluation RLE Deficits / Details: H/O foot fracture   Cervical / Trunk Assessment Cervical / Trunk Assessment: Other exceptions (tbackpain)   Communication Communication Communication: No difficulties   Cognition Arousal/Alertness: Awake/alert Behavior During Therapy: WFL for tasks assessed/performed Overall Cognitive Status: Within Functional Limits for tasks assessed                                       General Comments       Exercises     Shoulder Instructions      Home Living Family/patient expects to be discharged to:: Private residence Living Arrangements: Alone Available Help at Discharge: Family;Friend(s);Available PRN/intermittently Type of Home: House Home Access: Stairs to enter [employer] of Steps: 3-4   Home Layout: One level     Bathroom Shower/Tub: Chief Strategy Officer: Standard     Home Equipment: Shower seat;BSC/3in1   Additional Comments: father and sister have been assisting      Prior Functioning/Environment Prior Level of Function : Needs assist       Physical Assist : Mobility (physical)     Mobility Comments: limited ambulation at DC as was home only 1 day before readmission , using RW ADLs Comments: requires assist        OT Problem List: Decreased strength;Decreased range of motion;Decreased activity tolerance;Impaired balance (sitting and/or standing);Decreased safety awareness;Decreased knowledge of use of DME or AE;Cardiopulmonary status limiting activity;Pain      OT Treatment/Interventions: Self-care/ADL training;Therapeutic exercise;Energy conservation;DME and/or AE instruction;Therapeutic activities;Patient/family education;Balance training    OT Goals(Current goals can be found in the care plan section) Acute Rehab OT Goals Patient Stated Goal: to feel better OT  Goal Formulation: With patient Time For Goal Achievement: 03/23/23 Potential to Achieve Goals: Good  OT Frequency: Min 1X/week    Co-evaluation              AM-PAC OT "6 Clicks" Daily Activity     Outcome Measure Help from another person eating meals?: None Help from another person taking care of personal grooming?: A Little Help from another person toileting, which includes using toliet, bedpan, or urinal?: A Lot Help from another person bathing (including washing, rinsing, drying)?: A Lot Help from another person to put on and taking off regular upper body clothing?: A Little Help from another person to put on and taking off regular lower body clothing?: A Lot 6 Click Score: 16   End of Session Equipment Utilized During Treatment: Oxygen (7L) Nurse Communication: Other (comment) (O2 not connected to wall)  Activity Tolerance: Patient limited by fatigue;Patient limited by lethargy;Patient limited by pain Patient left: in bed;with call bell/phone within reach;with family/visitor present  OT Visit Diagnosis: Unsteadiness on feet (R26.81);Muscle weakness (generalized) (M62.81);Pain Pain - part of body:  (back)                Time: 1454-1510 OT Time Calculation (min): 16 min Charges:  OT General Charges $OT Visit: 1 Visit OT Evaluation $OT Eval Moderate Complexity: 1 Mod  Aristide Waggle, OT/L   Acute OT Clinical Specialist Acute Rehabilitation Services Pager [phone] Office [phone]   [facility] 03/09/2023, 3:25 PM

## 2023-03-09 NOTE — [place] FL2 (Signed)
MEDICAID FL2 LEVEL OF CARE FORM     IDENTIFICATION  Patient Name: Ana Curry Birthdate: 01-May-1971 Sex: female Admission Date (Current Location): 03/06/2023  [facility] and [place] Number:  Producer, television/film/video and Address:  [facility],  [address] [place], [place] [address]      Provider Number: [id number]  Attending Physician Name and Address:  Lorin Glass, MD  Relative Name and Phone Number:  Itzayani Guetter, [phone]    Current Level of Care: Hospital Recommended Level of Care: Skilled Nursing Facility Prior Approval Number:    Date Approved/Denied: 03/09/23 PASRR Number: [id number]  Discharge Plan: SNF    Current Diagnoses: Patient Active Problem List   Diagnosis Date Noted   Generalized weakness 03/07/2023   Hypoxia 03/07/2023   Pleural effusion 03/07/2023   Paroxysmal atrial fibrillation (HCC) 03/06/2023   Cancer associated pain 03/06/2023   SVC syndrome 03/01/2023   Small cell carcinoma of lung, right (HCC) 02/28/2023   Lung mass 02/23/2023   Atrial fibrillation with RVR (HCC) 02/23/2023   Hypokalemia 02/23/2023   Acute respiratory failure with hypoxia (HCC) 02/23/2023   Thrombocytopenia (HCC) 02/23/2023   COPD with acute exacerbation (HCC) 02/23/2023   Lactic acidosis 02/23/2023   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/30/2022   Pap smear of cervix shows high risk HPV present on 02/24/2021 03/03/2021    Orientation RESPIRATION BLADDER Height & Weight     Self, Situation, Time, Place  O2 (6L, 3L at baseline) Continent Weight: 172 lb 6.4 oz (78.2 kg) Height:  5\' 8"  (172.7 cm)  BEHAVIORAL SYMPTOMS/MOOD NEUROLOGICAL BOWEL NUTRITION STATUS      Continent Diet (See dc summary)  AMBULATORY STATUS COMMUNICATION OF NEEDS Skin   Extensive Assist Verbally Other (Comment) (Lumbar puncture)                       Personal Care Assistance Level of Assistance  Bathing, Feeding, Dressing Bathing Assistance: Maximum  assistance Feeding assistance: Limited assistance Dressing Assistance: Maximum assistance     Functional Limitations Info  Sight, Hearing, Speech Sight Info: Impaired (glasses) Hearing Info: Adequate Speech Info: Adequate    SPECIAL CARE FACTORS FREQUENCY  PT (By licensed PT)     PT Frequency: 5x week              Contractures Contractures Info: Not present    Additional Factors Info  Code Status, Allergies Code Status Info: Full Allergies Info: Codeine           Current Medications (03/09/2023):  This is the current hospital active medication list Current Facility-Administered Medications  Medication Dose Route Frequency Provider Last Rate Last Admin   acetaminophen (TYLENOL) tablet 650 mg  650 mg Oral Q6H PRN Charlsie Quest, MD   650 mg at 03/07/23 0930   Or   acetaminophen (TYLENOL) suppository 650 mg  650 mg Rectal Q6H PRN Charlsie Quest, MD       albuterol (PROVENTIL) (2.5 MG/3ML) 0.083% nebulizer solution 2.5 mg  2.5 mg Nebulization Q6H PRN Charlsie Quest, MD       amiodarone (PACERONE) tablet 200 mg  200 mg Oral BID Leeroy Bock, MD   200 mg at 03/09/23 8469   bisacodyl (DULCOLAX) EC tablet 5 mg  5 mg Oral Daily PRN Charlsie Quest, MD       Chlorhexidine Gluconate Cloth 2 % PADS 6 each  6 each Topical Daily Charlsie Quest, MD   6 each  at 03/09/23 0833   dextromethorphan-guaiFENesin (MUCINEX DM) 30-600 MG per 12 hr tablet 1 tablet  1 tablet Oral BID PRN Charlsie Quest, MD       diazepam (VALIUM) tablet 10 mg  10 mg Oral BID PRN Leeroy Bock, MD   10 mg at 03/09/23 1610   Glycerin (Adult) 2.1 g suppository 1 suppository  1 suppository Rectal Daily PRN Leeroy Bock, MD       guaiFENesin (MUCINEX) 12 hr tablet 600 mg  600 mg Oral BID Leeroy Bock, MD   600 mg at 03/09/23 0826   HYDROmorphone (DILAUDID) injection 0.5 mg  0.5 mg Intravenous Q2H PRN Darreld Mclean R, MD   0.5 mg at 03/09/23 0826   ipratropium-albuterol (DUONEB) 0.5-2.5  (3) MG/3ML nebulizer solution 3 mL  3 mL Nebulization Q4H PRN Darreld Mclean R, MD   3 mL at 03/09/23 0847   nicotine (NICODERM CQ - dosed in mg/24 hours) patch 14 mg  14 mg Transdermal Daily Leeroy Bock, MD   14 mg at 03/09/23 0829   ondansetron (ZOFRAN) injection 4 mg  4 mg Intravenous Q6H PRN Charlsie Quest, MD       oxyCODONE (OXYCONTIN) 12 hr tablet 10 mg  10 mg Oral Q12H Darreld Mclean R, MD   10 mg at 03/09/23 1027   polyethylene glycol (MIRALAX / GLYCOLAX) packet 17 g  17 g Oral BID Leeroy Bock, MD   17 g at 03/09/23 0827   pseudoephedrine (SUDAFED) 12 hr tablet 120 mg  120 mg Oral BID Leeroy Bock, MD   120 mg at 03/09/23 1027   senna (SENOKOT) tablet 8.6 mg  1 tablet Oral Daily Jamelle Rushing L, MD   8.6 mg at 03/09/23 0827   sodium chloride flush (NS) 0.9 % injection 3 mL  3 mL Intravenous Q12H Charlsie Quest, MD   3 mL at 03/09/23 9604     Discharge Medications: Please see discharge summary for a list of discharge medications.  Relevant Imaging Results:  Relevant Lab Results:   Additional Information [id number]  Coralyn Helling, LCSW

## 2023-03-09 NOTE — Progress Notes (Signed)
PROGRESS NOTE  Ana Curry  DOB: October 28, 1970  PCP: [facility] HQI:[id number]  DOA: 03/06/2023  LOS: 3 days  Hospital Day: 4  Brief narrative: Ana Curry is a 52 y.o. female with PMH significant for lifelong smoking who was recently hospitalized 6/14 to 6/24 for worsening shortness of breath for 2 months.  CT chest at that time showed right infrahilar/hilar mass that extended around the right mainstem bronchus, right upper lobe and all the way to the thoracic inlet causing SVC syndrome.  She was seen by oncology. 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  CT abdomen pelvis and MRI brain did not show any metastatic disease. Bone scan showed subtle asymmetric uptake involving proximal left tibia. 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 6/21, started on chemotherapy with first cycle on 6/21.   6/21 to 6/24, underwent radiation treatment on 6/21 and 6/24. 6/22, Port-A-Cath was placed 6/24, discharged home on home oxygen to follow-up with oncology as an outpatient. Of note, during that hospital stay, patient also was noted to be in new onset A-fib with RVR.  She was started on oral Cardizem.  Anticoagulation was not restarted because of thrombocytopenia.  6/25, patient was seen at radiation clinic.  Noted to be ill-appearing, hypotensive, tachycardic, short of breath, had generalized weakness and hence sent to the ED.    In the ED, it was found that they had BP 97/68, pulse 101, RR 22, temp 98.2 F, SpO2 93% on 3 L O2 via [place].  Significant findings included WBC 4.7, hemoglobin 10.0, platelets 33,000, sodium 128, potassium 3.6, bicarb 26, BUN 17, creatinine 0.76, serum glucose 97. UA negative. CT chest/abdomen/pelvis obtained. No significant change in size of extensive confluent lymphadenopathy extending from right supraclavicular region through the mediastinum and right hilum. Stable extrinsic compression of the right internal  jugular vein, left brachiocephalic vein, and SVC s/p Port-A-Cath placement. No evidence of large vessel occlusion or intravascular thrombus. No acute findings or evidence of metastatic disease in the abdomen or pelvis. Mildly increased moderate right and small left pleural effusions noted with increased compressive atelectasis in the right middle lobe. Additionally increased patchy groundglass opacities throughout both lungs noted. Stable moderate pericardial effusion seen.   She was given IV hydration, IV pain meds, IV antiemetics Admitted to [facility] was consulted.  Subjective: Patient was seen and examined this morning. Pleasant middle-aged Caucasian female.  Lying on bed.  Multiple family members at bedside.  Patient was on 6 L oxygen by nasal cannula.  Had not had a bowel movement in 5 days Chart reviewed Seen by oncology this morning.  Assessment and plan: Acute respiratory failure with hypoxia Extensive stage  Bilateral pleural effusion Recently discharged on home oxygen, readmitted with worsening shortness of breath Currently on supplemental oxygen at 6 L. IR did thoracentesis today planned CT chest with right lung small cell cancer with extension of right infrahilar, right mainstem bronchus, right upper lobe and thoracic inlet adenopathy Bone marrow biopsy as well showed metastasis  Oncology following. Plan to continue radiation.  Immunotherapy on hold Continue DuoNeb  Thrombocytopenia Low platelets due to bone marrow involvement.  No active bleeding.  1 unit platelet transfusion given on 6/27. Continue monitor Recent Labs  Lab 03/03/23 0508 03/04/23 0500 03/05/23 0822 03/06/23 1614 03/07/23 0525 03/08/23 0323 03/09/23 0341  PLT 50* 42* 33* 33* 31* 27* 40*   Moderate pericardial effusion: Stable pericardial effusion noted on CT.  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   stable findings on repeat CT imaging.  Has been undergoing radiation  treatment.  Hyponatremia Likely due to poor intake. Recent Labs  Lab 03/03/23 0508 03/04/23 0500 03/05/23 0822 03/06/23 1614 03/07/23 0525 03/08/23 0323 03/09/23 0341  NA 132* 133* 133* 128* 130* 134* 132*     Paroxysmal atrial fibrillation: Developed A-fib RVR on recent admission.   Remains tachycardic, but gradually slowing down.  Continue amiodarone Not on anticoagulation due to thrombocytopenia.   Cancer associated pain and anxiety Worsened pain throughout whole body.  Currently on OxyContin 10 mg BID.  I also added oxycodone as needed Ghimire Valium scheduled continue IV Dilaudid as needed.   Opiate related constipation Constipation more than 5 days Avoiding suppository or enema because of neutropenia Continue miralax, senna   Mobility: Encourage ambulation  Goals of care   Code Status: Full Code     DVT prophylaxis:  SCDs Start: 03/06/23 1947   Antimicrobials: None Fluid: NS at 50 mill per hour Consultants: Oncology Family Communication: Multiple family members at bedside  Status: Patient Level of care:  Progressive   Patient from: Home Anticipated d/c to: Pending clinical course Needs to continue in-hospital care:  Trouble breathing, no BM in 5 days     Diet:  Diet Order             Diet regular Fluid consistency: Thin  Diet effective now                   Scheduled Meds:  amiodarone  200 mg Oral BID   Chlorhexidine Gluconate Cloth  6 each Topical Daily   diazepam  10 mg Oral BID   guaiFENesin  600 mg Oral BID   lactulose  10 g Oral TID   nicotine  14 mg Transdermal Daily   oxyCODONE  10 mg Oral Q12H   polyethylene glycol  17 g Oral BID   pseudoephedrine  120 mg Oral BID   senna-docusate  1 tablet Oral BID   sodium chloride flush  3 mL Intravenous Q12H   Tbo-Filgrastim  300 mcg Subcutaneous q1800    PRN meds: acetaminophen **OR** acetaminophen, albuterol, bisacodyl, dextromethorphan-guaiFENesin, Glycerin (Adult),  HYDROmorphone (DILAUDID) injection, ipratropium-albuterol, ondansetron (ZOFRAN) IV, oxyCODONE   Infusions:     Antimicrobials: Anti-infectives (From admission, onward)    None       Nutritional status:  Body mass index is 26.21 kg/m.          Objective: Vitals:   03/09/23 1259 03/09/23 1344  BP: (!) 124/106 105/65  Pulse:  (!) 108  Resp:  20  Temp:  98.1 F (36.7 C)  SpO2:  95%    Intake/Output Summary (Last 24 hours) at 03/09/2023 1440 Last data filed at 03/09/2023 0420 Gross per 24 hour  Intake 1420.68 ml  Output --  Net 1420.68 ml   Filed Weights   03/06/23 1320 03/06/23 2315 03/09/23 0500  Weight: 71.2 kg 77.1 kg 78.2 kg   Weight change:  Body mass index is 26.21 kg/m.   Physical Exam: General exam: Pleasant, middle-aged Caucasian female.  In distress from pain, anxiety, dyspnea Skin: No rashes, lesions or ulcers. HEENT: Atraumatic, normocephalic, no obvious bleeding Lungs: Diminished entry in both bases. CVS: Mild tachycardia, A-fib, no murmur GI/Abd soft, mild diffuse tenderness, distention present, bowel sound present CNS : Alert, awake, oriented x 3 psychiatry: Mood appropriate Extremities: No pedal edema, no calf tenderness  Data Review: I have personally reviewed the laboratory data and studies  available.  F/u labs ordered Unresulted Labs (From admission, onward)     Start     Ordered   03/09/23 1306  Body fluid cell count with differential  RELEASE UPON ORDERING,   TIMED        03/09/23 1306   03/08/23 0500  CBC with Differential/Platelet  Daily,   R     Question:  Specimen collection method  Answer:  IV Team=IV Team collect   03/07/23 1007            Total time spent in review of labs and imaging, patient evaluation, formulation of plan, documentation and communication with family: 55 minutes  Signed, Lorin Glass, MD [facility] 03/09/2023

## 2023-03-09 NOTE — Procedures (Signed)
PROCEDURE SUMMARY:  Successful image-guided right thoracentesis. Yielded 1.1 liters of clear yellow fluid. Patient tolerated procedure well. EBL: Zero No immediate complications.  Specimen was sent for labs. Post procedure CXR shows no pneumothorax.  Please see imaging section of Epic for full dictation.  Villa Herb PA-C 03/09/2023 1:06 PM

## 2023-03-09 NOTE — TOC Initial Note (Addendum)
Transition of Care [facility]) - Initial/Assessment Note    Patient Details  Name: Ana Curry MRN: [id number] Date of Birth: 08-25-1971  [facility] CM/SW Contact:    Coralyn Helling, LCSW Phone Number: 03/09/2023, 10:42 AM  Clinical Narrative:     Patient from home alone. Patient recently dc from hospital with o2. Patient was only home for a few hours with o2 prior to returning to the hospital.   Patient and family are agreeable to SNF. TOC explained SNF referral process. Patient and family expressed understanding. Patient and family prefer [facility] or [facility] for placement.            Family able to transport to chemo and radiation.   Expected Discharge Plan: Skilled Nursing Facility Barriers to Discharge: Continued Medical Work up   Patient Goals and CMS Choice Patient states their goals for this hospitalization and ongoing recovery are:: Ok going to SNF until I can go home. CMS Medicare.gov Compare Post Acute Care list provided to:: Patient Choice offered to / list presented to : Patient, Parent [place] ownership interest in [facility].provided to:: Spouse    Expected Discharge Plan and Services In-house Referral: NA Discharge Planning Services: NA Post Acute Care Choice: Skilled Nursing Facility Living arrangements for the past 2 months: Single Family Home                 DME Arranged: N/A DME Agency: NA       HH Arranged: NA HH Agency: NA        Prior Living Arrangements/Services Living arrangements for the past 2 months: Single Family Home Lives with:: Self Patient language and need for interpreter reviewed:: Yes Do you feel safe going back to the place where you live?: Yes      Need for Family Participation in Patient Care: Yes (Comment) Care giver support system in place?: Yes (comment) Current home services: DME Criminal Activity/Legal Involvement Pertinent to Current Situation/Hospitalization: No - Comment as  needed  Activities of Daily Living Home Assistive Devices/Equipment: None ADL Screening (condition at time of admission) Patient's cognitive ability adequate to safely complete daily activities?: Yes Is the patient deaf or have difficulty hearing?: No Does the patient have difficulty seeing, even when wearing glasses/contacts?: No Does the patient have difficulty concentrating, remembering, or making decisions?: No Patient able to express need for assistance with ADLs?: Yes Does the patient have difficulty dressing or bathing?: No Independently performs ADLs?: Yes (appropriate for developmental age) Does the patient have difficulty walking or climbing stairs?: Yes Weakness of Legs: Right Weakness of Arms/Hands: None  Permission Sought/Granted Permission sought to share information with : Family Supports Permission granted to share information with : Yes, Verbal Permission Granted  Share Information with NAME: [name], Father and sister  Permission granted to share info w AGENCY: SNF  Permission granted to share info w Relationship: Father and Sister     Emotional Assessment Appearance:: Appears stated age Attitude/Demeanor/Rapport: Engaged Affect (typically observed): Accepting Orientation: : Oriented to Self, Oriented to Place, Oriented to  Time, Oriented to Situation Alcohol / Substance Use: Not Applicable Psych Involvement: No (comment)  Admission diagnosis:  Pleural effusion [J90] Hypoxia [R09.02] Acute respiratory failure with hypoxia (HCC) [J96.01] Generalized weakness [R53.1] Patient Active Problem List   Diagnosis Date Noted   Generalized weakness 03/07/2023   Hypoxia 03/07/2023   Pleural effusion 03/07/2023   Paroxysmal atrial fibrillation (HCC) 03/06/2023   Cancer associated pain 03/06/2023   SVC syndrome 03/01/2023  Small cell carcinoma of lung, right (HCC) 02/28/2023   Lung mass 02/23/2023   Atrial fibrillation with RVR (HCC) 02/23/2023   Hypokalemia  02/23/2023   Acute respiratory failure with hypoxia (HCC) 02/23/2023   Thrombocytopenia (HCC) 02/23/2023   COPD with acute exacerbation (HCC) 02/23/2023   Lactic acidosis 02/23/2023   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 [place] [address] Phone: [phone] Fax: [phone]  [facility] [place], [place] - [address] [place] [address] Phone: [phone] Fax: [phone]     Social Determinants of Health (SDOH) Social History: SDOH Screenings   Food Insecurity: No Food Insecurity (03/06/2023)  Housing: Low Risk  (03/06/2023)  Transportation Needs: No Transportation Needs (03/06/2023)  Utilities: Not At Risk (03/06/2023)  Tobacco Use: High Risk (03/06/2023)   SDOH Interventions:     Readmission Risk Interventions    03/07/2023    1:15 PM  Readmission Risk Prevention Plan  Transportation Screening Complete  Home Care Screening Complete  Medication Review (RN CM) Complete

## 2023-03-09 NOTE — Progress Notes (Signed)
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   Pain from procedure/lower back pain Could be an element of bone pain from cancer involvement of her bone marrow She will continue pain medications as needed We discussed risk of constipation with chronic pain medicine   Opiate induced constipation I suspect an element of her pain could be related to constipation This is seen on her imaging study Recommend laxative therapy I recommend avoid enema or suppository due to recent chemotherapy and anticipated pancytopenia in the future We will   Respiratory failure due to lung cancer Continue oxygen therapy I recommend attempt for thoracentesis   Acquired pancytopenia Possible bone marrow involvement Anticipate her platelet count will drop over the next few days. Recommend G-CSF support daily until [facility] is greater than 1.5 Will check CBC daily and transfuse if signs of bleeding or platelet count less than 10,000   Low BP, hyponatremia, poor oral intake This has improved with IV fluids from prior hospitalization Recommend gentle IV fluid hydration with normal saline   Goals of care She will continue radiation therapy in the hospital I do not think it is realistic to expect her to be discharged home as the patient is not able to take care of herself even with family support and she is readmitted in less than 24 hours after discharge   Discharge planning Recommend consideration for skilled nursing facility as part of her discharge planning Due to progressive pancytopenia, I anticipate she will be here  throughout the weekend.  It is not safe to be discharged until her platelet counts not trending up without transfusion support which I anticipate will be sometime next week  All questions were answered. The patient knows to call the clinic with any problems, questions or concerns.   The total time spent in the appointment was 40 minutes encounter with patients including review of chart and various tests results, discussions about plan of care and coordination of care plan  Artis Delay, MD 03/09/2023 11:38 AM  Subjective:  She was seen in the room.  Family by the bedside.  She inquired about the timing of thoracentesis.  Platelet count has improved with platelet transfusion but white count remain low She has no bowel movement for over 5 days  Objective:  Vitals:   03/09/23 0620 03/09/23 0847  BP:    Pulse:    Resp: (!) 24   Temp:    SpO2:  95%     Intake/Output Summary (Last 24 hours) at 03/09/2023 1138 Last data filed at 03/09/2023 0420 Gross per 24 hour  Intake 1420.68 ml  Output --  Net 1420.68 ml    GENERAL:alert, no distress and comfortable.  Oxygen via nasal cannula NEURO: alert & oriented x 3 with fluent speech, no focal motor/sensory deficits   Labs:  Recent Labs    03/05/23 0822 03/06/23 1614 03/07/23 0525 03/08/23 0323 03/09/23 0341  NA 133* 128* 130* 134* 132*  K 3.6 3.6 3.6 3.7 3.8  CL 101 95* 95* 98 96*  CO2 24 26 25 27 25   GLUCOSE 113* 97 118* 109* 108*  BUN 15 17 19 16  15  CREATININE 0.83 0.76 0.91 0.93 0.82  CALCIUM 9.4 8.9 9.2 9.0 9.3  GFRNONAA >60 >60 >60 >60 >60  PROT 5.9* 5.8* 5.6*  --   --   ALBUMIN 2.8* 2.8* 2.6*  --   --   AST 39 44* 45*  --   --   ALT 26 24 24   --   --   ALKPHOS 86 84 80  --   --   BILITOT 0.5 0.9 0.7  --   --     Studies:  CT Chest W Contrast  Result Date: 03/06/2023 CLINICAL DATA:  Small cell lung cancer. Increasing back pain and extreme fatigue. 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 * Tracking Code: BO * EXAM: CT  CHEST, ABDOMEN, AND PELVIS WITH CONTRAST TECHNIQUE: Multidetector CT imaging of the chest, abdomen and pelvis was performed following the standard protocol during bolus administration of intravenous contrast. RADIATION DOSE REDUCTION: This exam was performed according to the departmental dose-optimization program which includes automated exposure control, adjustment of the mA and/or kV according to patient size and/or use of iterative reconstruction technique. CONTRAST:  OMNIPAQUE IOHEXOL 300 MG/ML  SOLN COMPARISON:  Chest CT 02/23/2023.  Abdominopelvic CT 02/27/2023. FINDINGS: CT CHEST FINDINGS Cardiovascular: A left subclavian Port-A-Cath has been placed, extending into the superior aspect of the right atrium. There is significant extrinsic compression of the right internal jugular, left brachiocephalic vein and SVC, similar to previous CT. There is also extrinsic mass effect on the right pulmonary artery and veins, also similar. No large vessel occlusion or intravascular thrombus identified. The heart size is stable. A moderate size pericardial effusion appears unchanged. Mediastinum/Nodes: Again demonstrated is extensive confluent lymphadenopathy extending from the right supraclavicular region through the mediastinum into the right hilum. There is a right supraclavicular component which is incompletely visualized, measuring approximately 4.9 x 3.7 cm on image 1/2. There is a right paratracheal component measuring 4.8 x 4.6 cm, and this has a 3.4 x 2.7 cm component extending anterior to the brachiocephalic veins on images 18/2. Overall, these lymph nodes are similar in size to the prior chest CT, although demonstrate lower central density suggesting response to treatment and necrosis. No progressive adenopathy identified. Stable extrinsic narrowing of the central right-sided airways without occlusion. The esophagus appears unremarkable. Lungs/Pleura: New moderate right and small left pleural effusions  (compared with prior chest CT; minimally enlarged compared with more recent abdominal CT). Associated increased compressive atelectasis in the right middle lobe. There are additional increased patchy ground-glass opacities throughout both lungs which may reflect edema or posterior obstructive pneumonitis. There is asymmetric central airway thickening on the right. Underlying mild to moderate centrilobular emphysema. No pneumothorax. Musculoskeletal/Chest wall: No chest wall mass or suspicious osseous findings. CT ABDOMEN AND PELVIS FINDINGS Hepatobiliary: The liver is normal in density without suspicious focal abnormality. Unchanged 9 mm low-density lesion in the left hepatic lobe on image 56/2, likely a cyst. No evidence of gallstones, gallbladder wall thickening or biliary dilatation. Pancreas: Unremarkable. No pancreatic ductal dilatation or surrounding inflammatory changes. Spleen: Normal in size without focal abnormality. Adrenals/Urinary Tract: Both adrenal glands appear normal. No evidence of urinary tract calculus, suspicious renal lesion or hydronephrosis. The bladder appears normal for its degree of distention. Stomach/Bowel: No enteric contrast administered. The stomach appears unremarkable for its degree of distension. No evidence of bowel wall thickening, distention or surrounding inflammatory change. Prominent stool throughout the colon. Vascular/Lymphatic: There are no enlarged abdominal or pelvic lymph nodes. Aortic and branch vessel atherosclerosis without evidence  of aneurysm or large vessel occlusion. Reproductive: The uterus and ovaries appear normal. No adnexal mass. Other: No evidence of abdominal wall mass or hernia. No ascites or pneumoperitoneum. Musculoskeletal: No acute or significant osseous findings. IMPRESSION: 1. No significant change in size of extensive confluent lymphadenopathy extending from the right supraclavicular region through the mediastinum and right hilum. The lymph nodes  demonstrate lower central density suggesting response to treatment and necrosis. 2. Grossly stable extrinsic compression of the right internal jugular vein, left brachiocephalic vein and SVC status post Port-A-Cath placement. These findings may contribute to symptoms of SVC syndrome. No evidence of large vessel occlusion or intravascular thrombus. 3. No acute findings or evidence of metastatic disease in the abdomen or pelvis. 4. Mildly increased moderate right and small left pleural effusions with increased compressive atelectasis in the right middle lobe. There are additional increased patchy ground-glass opacities throughout both lungs which may reflect edema or posterior obstructive pneumonitis. 5. Stable moderate pericardial effusion. 6. Aortic Atherosclerosis (ICD10-I70.0) and Emphysema (ICD10-J43.9). Electronically Signed   By: Carey Bullocks M.D.   On: 03/06/2023 18:17   CT ABDOMEN PELVIS W CONTRAST  Result Date: 03/06/2023 CLINICAL DATA:  Small cell lung cancer. Increasing back pain and extreme fatigue. 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 * Tracking Code: BO * EXAM: CT CHEST, ABDOMEN, AND PELVIS WITH CONTRAST TECHNIQUE: Multidetector CT imaging of the chest, abdomen and pelvis was performed following the standard protocol during bolus administration of intravenous contrast. RADIATION DOSE REDUCTION: This exam was performed according to the departmental dose-optimization program which includes automated exposure control, adjustment of the mA and/or kV according to patient size and/or use of iterative reconstruction technique. CONTRAST:  OMNIPAQUE IOHEXOL 300 MG/ML  SOLN COMPARISON:  Chest CT 02/23/2023.  Abdominopelvic CT 02/27/2023. FINDINGS: CT CHEST FINDINGS Cardiovascular: A left subclavian Port-A-Cath has been placed, extending into the superior aspect of the right atrium. There is significant extrinsic compression of the right internal jugular, left brachiocephalic vein and SVC, similar to  previous CT. There is also extrinsic mass effect on the right pulmonary artery and veins, also similar. No large vessel occlusion or intravascular thrombus identified. The heart size is stable. A moderate size pericardial effusion appears unchanged. Mediastinum/Nodes: Again demonstrated is extensive confluent lymphadenopathy extending from the right supraclavicular region through the mediastinum into the right hilum. There is a right supraclavicular component which is incompletely visualized, measuring approximately 4.9 x 3.7 cm on image 1/2. There is a right paratracheal component measuring 4.8 x 4.6 cm, and this has a 3.4 x 2.7 cm component extending anterior to the brachiocephalic veins on images 18/2. Overall, these lymph nodes are similar in size to the prior chest CT, although demonstrate lower central density suggesting response to treatment and necrosis. No progressive adenopathy identified. Stable extrinsic narrowing of the central right-sided airways without occlusion. The esophagus appears unremarkable. Lungs/Pleura: New moderate right and small left pleural effusions (compared with prior chest CT; minimally enlarged compared with more recent abdominal CT). Associated increased compressive atelectasis in the right middle lobe. There are additional increased patchy ground-glass opacities throughout both lungs which may reflect edema or posterior obstructive pneumonitis. There is asymmetric central airway thickening on the right. Underlying mild to moderate centrilobular emphysema. No pneumothorax. Musculoskeletal/Chest wall: No chest wall mass or suspicious osseous findings. CT ABDOMEN AND PELVIS FINDINGS Hepatobiliary: The liver is normal in density without suspicious focal abnormality. Unchanged 9 mm low-density lesion in the left hepatic lobe on image 56/2, likely a cyst. No evidence  of gallstones, gallbladder wall thickening or biliary dilatation. Pancreas: Unremarkable. No pancreatic ductal dilatation  or surrounding inflammatory changes. Spleen: Normal in size without focal abnormality. Adrenals/Urinary Tract: Both adrenal glands appear normal. No evidence of urinary tract calculus, suspicious renal lesion or hydronephrosis. The bladder appears normal for its degree of distention. Stomach/Bowel: No enteric contrast administered. The stomach appears unremarkable for its degree of distension. No evidence of bowel wall thickening, distention or surrounding inflammatory change. Prominent stool throughout the colon. Vascular/Lymphatic: There are no enlarged abdominal or pelvic lymph nodes. Aortic and branch vessel atherosclerosis without evidence of aneurysm or large vessel occlusion. Reproductive: The uterus and ovaries appear normal. No adnexal mass. Other: No evidence of abdominal wall mass or hernia. No ascites or pneumoperitoneum. Musculoskeletal: No acute or significant osseous findings. IMPRESSION: 1. No significant change in size of extensive confluent lymphadenopathy extending from the right supraclavicular region through the mediastinum and right hilum. The lymph nodes demonstrate lower central density suggesting response to treatment and necrosis. 2. Grossly stable extrinsic compression of the right internal jugular vein, left brachiocephalic vein and SVC status post Port-A-Cath placement. These findings may contribute to symptoms of SVC syndrome. No evidence of large vessel occlusion or intravascular thrombus. 3. No acute findings or evidence of metastatic disease in the abdomen or pelvis. 4. Mildly increased moderate right and small left pleural effusions with increased compressive atelectasis in the right middle lobe. There are additional increased patchy ground-glass opacities throughout both lungs which may reflect edema or posterior obstructive pneumonitis. 5. Stable moderate pericardial effusion. 6. Aortic Atherosclerosis (ICD10-I70.0) and Emphysema (ICD10-J43.9). Electronically Signed   By: Carey Bullocks M.D.   On: 03/06/2023 18:17   DG Chest Portable 1 View  Result Date: 03/06/2023 CLINICAL DATA:  Fatigue and weakness.  On chemotherapy. EXAM: PORTABLE CHEST 1 VIEW COMPARISON:  Chest x-ray 02/28/2023 FINDINGS: Hyperinflation. Small right effusion with adjacent opacity similar to previous. Stable interstitial prominence. Stable cardiopericardial silhouette. No pneumothorax. Again fullness of the right lung hilum. Left IJ chest port in place tip at the SVC right atrial junction. The port is accessed. IMPRESSION: Persistent right-sided effusion adjacent lung opacity with fullness of the right side of the lung hilum and widening of the mediastinum. Hyperinflation. Chest port Electronically Signed   By: Karen Kays M.D.   On: 03/06/2023 16:50   IR IMAGING GUIDED PORT INSERTION  Result Date: 03/02/2023 INDICATION: History of metastatic lung cancer. In need of durable intravenous access for the initiation of chemotherapy. EXAM: IMPLANTED PORT A CATH PLACEMENT WITH ULTRASOUND AND FLUOROSCOPIC GUIDANCE COMPARISON:  Neck CT-02/23/2023 MEDICATIONS: None ANESTHESIA/SEDATION: Moderate (conscious) sedation was employed during this procedure as administered by the Interventional Radiology RN. A total of Benadryl 25 mg and Fentanyl 25 mcg was administered intravenously. Moderate Sedation Time: 23 minutes. The patient's level of consciousness and vital signs were monitored continuously by radiology nursing throughout the procedure under my direct supervision. CONTRAST:  None FLUOROSCOPY TIME:  24 seconds (4 mGy) COMPLICATIONS: None immediate. PROCEDURE: The procedure, risks, benefits, and alternatives were explained to the patient. Questions regarding the procedure were encouraged and answered. The patient understands and consents to the procedure. Given presence of known right supraclavicular bulky lymphadenopathy, the decision was made to proceed with left internal jugular approach port a catheter placement. As  such, the left neck and chest were prepped with chlorhexidine in a sterile fashion, and a sterile drape was applied covering the operative field. Maximum barrier sterile technique with sterile gowns and gloves were  used for the procedure. A timeout was performed prior to the initiation of the procedure. Local anesthesia was provided with 1% lidocaine with epinephrine. After creating a small venotomy incision, a micropuncture kit was utilized to access the internal jugular vein. Real-time ultrasound guidance was utilized for vascular access including the acquisition of a permanent ultrasound image documenting patency of the accessed vessel. The microwire was utilized to measure appropriate catheter length. A subcutaneous port pocket was then created along the upper chest wall utilizing a combination of sharp and blunt dissection. The pocket was irrigated with sterile saline. A single lumen clear view power injectable port was chosen for placement. The 8 Fr catheter was tunneled from the port pocket site to the venotomy incision. The port was placed in the pocket. The external catheter was trimmed to appropriate length. At the venotomy, an 8 Fr peel-away sheath was placed over a guidewire under fluoroscopic guidance. The catheter was then placed through the sheath and the sheath was removed. Final catheter positioning was confirmed and documented with a fluoroscopic spot radiograph. The port was accessed with a Huber needle, aspirated and flushed with heparinized saline. The venotomy site was closed with an interrupted 4-0 Vicryl suture. The port pocket incision was closed with interrupted 2-0 Vicryl suture. Dermabond and Steri-strips were applied to both incisions. Dressings were applied. The patient tolerated the procedure well without immediate post procedural complication. FINDINGS: After catheter placement, the tip lies within the superior cavoatrial junction. The catheter aspirates and flushes normally and is  ready for immediate use. IMPRESSION: Successful placement of a left internal jugular approach power injectable Port-A-Cath. The catheter is ready for immediate use. Electronically Signed   By: Simonne Come M.D.   On: 03/02/2023 15:05   CT BONE MARROW BIOPSY & ASPIRATION  Result Date: 03/02/2023 INDICATION: History of metastatic lung cancer, now with thrombocytopenia. Please perform CT-guided bone marrow biopsy for tissue diagnostic purposes. EXAM: CT-GUIDED BONE MARROW BIOPSY AND ASPIRATION MEDICATIONS: None ANESTHESIA/SEDATION: Moderate (conscious) sedation was employed during this procedure as administered by the Interventional Radiology RN. A total of Benadryl 25 mg, Versed 1 mg and Fentanyl 50 mcg was administered intravenously. Moderate Sedation Time: 10 minutes. The patient's level of consciousness and vital signs were monitored continuously by radiology nursing throughout the procedure under my direct supervision. COMPLICATIONS: None immediate. PROCEDURE: Informed consent was obtained from the patient following an explanation of the procedure, risks, benefits and alternatives. The patient understands, agrees and consents for the procedure. All questions were addressed. A time out was performed prior to the initiation of the procedure. The patient was positioned prone and non-contrast localization CT was performed of the pelvis to demonstrate the iliac marrow spaces. The operative site was prepped and draped in the usual sterile fashion. Under sterile conditions and local anesthesia, a 22 gauge spinal needle was utilized for procedural planning. Next, an 11 gauge coaxial bone biopsy needle was advanced into the left iliac marrow space. Needle position was confirmed with CT imaging. Initially, a bone marrow aspiration was performed. Next, a bone marrow biopsy was obtained with the 11 gauge outer bone marrow device. The needle was removed and superficial hemostasis was obtained with manual compression. A  dressing was applied. The patient tolerated the procedure well without immediate post procedural complication. IMPRESSION: Successful CT guided left iliac bone marrow aspiration and core biopsy. Electronically Signed   By: Simonne Come M.D.   On: 03/02/2023 15:03   IR US CHEST  Result Date: 02/28/2023 CLINICAL DATA:  Evaluate for thoracentesis. EXAM: CHEST ULTRASOUND COMPARISON:  Chest radiograph 02/28/2023 FINDINGS: Small amount of right pleural fluid was identified. No percutaneous window for thoracentesis due to a flap of lung. Thoracentesis not performed. IMPRESSION: Small right pleural effusion. Thoracentesis not performed due to lack of a safe percutaneous window. Electronically Signed   By: Richarda Overlie M.D.   On: 02/28/2023 16:10   NM Bone Scan Whole Body  Result Date: 02/28/2023 CLINICAL DATA:  Lung cancer EXAM: NUCLEAR MEDICINE WHOLE BODY BONE SCAN TECHNIQUE: Whole body anterior and posterior images were obtained approximately 3 hours after intravenous injection of radiopharmaceutical. RADIOPHARMACEUTICALS:  21.0 mCi Technetium-26m MDP IV COMPARISON:  CT scan earlier June 2024 of multiple areas. FINDINGS: Physiologic distribution of radiotracer overall. There is some uptake involving the right midfoot consistent with known provided history of injury in the past. There is slight asymmetric uptake along the proximal left tibia from the other side. There is also some subtle areas of low uptake along the distal femoral metaphysis. Recommend dedicated x-rays. Mild areas of degenerative uptake such as along the extremities. Of note the kidneys are not as well seen as typical. Please correlate with clinical findings. IMPRESSION: Subtle asymmetric uptake involving the proximal left tibia. Slight low uptake along the distal femurs. Recommend dedicated x-rays if there is no known history. Atypical low uptake of the renal parenchyma. The rest of the findings do not suggest super scan but please correlate with  the patient's renal function and other history. This may be technical. Electronically Signed   By: Karen Kays M.D.   On: 02/28/2023 14:40   DG CHEST PORT 1 VIEW  Result Date: 02/28/2023 CLINICAL DATA:  Pleural effusion on right. EXAM: PORTABLE CHEST 1 VIEW COMPARISON:  Radiographs 02/27/2023 and 02/23/2023. Abdominal CT 02/27/2023 and chest CT 02/23/2023. FINDINGS: 0845 hours. Mild patient rotation to the right. Grossly stable heart size and mediastinal contours with known extensive mediastinal lymphadenopathy. There are enlarging right greater than left pleural effusions with increasing atelectasis at both lung bases. No pneumothorax. The bones appear unchanged. Telemetry leads overlie the chest. IMPRESSION: 1. Enlarging right greater than left pleural effusions with increasing bibasilar atelectasis. 2. Grossly stable mediastinal lymphadenopathy from recent chest CT, presumed lung cancer. Electronically Signed   By: Carey Bullocks M.D.   On: 02/28/2023 12:55   MR BRAIN W WO CONTRAST  Result Date: 02/28/2023 CLINICAL DATA:  Staging of small cell lung carcinoma EXAM: MRI HEAD WITHOUT AND WITH CONTRAST TECHNIQUE: Multiplanar, multiecho pulse sequences of the brain and surrounding structures were obtained without and with intravenous contrast. CONTRAST:  7mL GADAVIST GADOBUTROL 1 MMOL/ML IV SOLN COMPARISON:  None Available. FINDINGS: Brain: No acute infarct, mass effect or extra-axial collection. No acute or chronic hemorrhage. Normal white matter signal. On diffusion-weighted imaging and the FLAIR sequence, there are bilateral convexity signal abnormalities within the subdural space. The midline structures are normal. There is smooth, diffuse pachymeningeal contrast enhancement. There are no intraparenchymal contrast enhancing lesions. Vascular: Major flow voids are preserved. Skull and upper cervical spine: Normal calvarium and skull base. Visualized upper cervical spine and soft tissues are normal.  Sinuses/Orbits:Mastoid effusions. Paranasal sinuses are clear. Normal orbits. IMPRESSION: 1. No intraparenchymal metastatic disease. 2. Bilateral convexity signal abnormalities within the subdural space, which may indicate aging subdural blood. 3. Smooth, diffuse pachymeningeal contrast enhancement. 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 Metastatic disease felt less likely  given the lack nodularity. Electronically Signed   By: Deatra Robinson M.D.   On: 02/28/2023 02:17   DG CHEST PORT 1 VIEW  Result Date: 02/27/2023 CLINICAL DATA:  Shortness of breath. EXAM: PORTABLE CHEST 1 VIEW COMPARISON:  February 23, 2023. FINDINGS: Stable cardiomegaly. Mild central pulmonary vascular congestion is noted. Minimal bibasilar pulmonary edema is noted with small right pleural effusion. Bony thorax is unremarkable. IMPRESSION: Stable cardiomegaly with mild central pulmonary vascular congestion. Minimal bibasilar pulmonary edema is noted with probable small right pleural effusion. Electronically Signed   By: Lupita Raider M.D.   On: 02/27/2023 16:27   CT ABDOMEN PELVIS W CONTRAST  Result Date: 02/27/2023 CLINICAL DATA:  Non-small-cell lung cancer. Staging. * Tracking Code: BO * EXAM: CT ABDOMEN AND PELVIS WITH CONTRAST TECHNIQUE: Multidetector CT imaging of the abdomen and pelvis was performed using the standard protocol following bolus administration of intravenous contrast. RADIATION DOSE REDUCTION: This exam was performed according to the departmental dose-optimization program which includes automated exposure control, adjustment of the mA and/or kV according to patient size and/or use of iterative reconstruction technique. CONTRAST:  75mL OMNIPAQUE IOHEXOL 350 MG/ML SOLN COMPARISON:  Chest CTA 02/23/2023 FINDINGS: Lower chest: Bibasilar collapse/consolidation with bilateral pleural effusions, right greater  than left. Pleural effusions are progressive since the chest CT 4 days ago. Hepatobiliary: Small area of low attenuation in the anterior liver, adjacent to the falciform ligament, is in a characteristic location for focal fatty deposition. 9 mm hypodensity in the left liver (15/3) is too small to characterize but statistically is likely benign. There is no evidence for gallstones, gallbladder wall thickening, or pericholecystic fluid. No intrahepatic or extrahepatic biliary dilation. Pancreas: No focal mass lesion. No dilatation of the main duct. No intraparenchymal cyst. No peripancreatic edema. Spleen: No splenomegaly. No suspicious focal mass lesion. Adrenals/Urinary Tract: No adrenal nodule or mass. Kidneys unremarkable. No evidence for hydroureter. The urinary bladder appears normal for the degree of distention. Stomach/Bowel: Stomach is distended with food and fluid. Duodenum is normally positioned as is the ligament of Treitz. No small bowel wall thickening. No small bowel dilatation. The terminal ileum is normal. The appendix is normal. No gross colonic mass. No colonic wall thickening. Vascular/Lymphatic: Moderate atherosclerotic calcification is noted in the wall of the aorta. There is no gastrohepatic or hepatoduodenal ligament lymphadenopathy. No retroperitoneal or mesenteric lymphadenopathy. No pelvic sidewall lymphadenopathy. Reproductive: Unremarkable. Other: No intraperitoneal free fluid. Musculoskeletal: No worrisome lytic or sclerotic osseous abnormality. IMPRESSION: 1. No definite evidence for metastatic disease in the abdomen or pelvis. 2. 9 mm hypodensity in the left liver is too small to characterize but statistically is likely benign. Attention on follow-up recommended. 3. Bibasilar collapse/consolidation with bilateral pleural effusions, right greater than left. Pleural effusions are progressive since the chest CT 4 days ago. 4.  Aortic Atherosclerosis (ICD10-I70.0). Electronically Signed    By: Kennith Center M.D.   On: 02/27/2023 11:47   [place] CORE BIOPSY (SOFT TISSUE)  Result Date: 02/26/2023 INDICATION: Cervical and supraclavicular adenopathy.  No diagnosis. EXAM: ULTRASOUND-GUIDED RIGHT SUPRACLAVICULAR NODAL MASS BIOPSY COMPARISON:  CT neck and chest, 09/24/2022. MEDICATIONS: None ANESTHESIA/SEDATION: Local anesthetic was administered. COMPLICATIONS: None immediate. TECHNIQUE: Informed written consent was obtained from the patient and/or patient's representative after a discussion of the risks, benefits and alternatives to treatment. Questions regarding the procedure were encouraged and answered. Initial ultrasound scanning demonstrated RIGHT supraclavicular nodal mass. An ultrasound image was saved for documentation purposes. The procedure was planned. A timeout was performed  prior to the initiation of the procedure. The operative was prepped and draped in the usual sterile fashion, and a sterile drape was applied covering the operative field. A timeout was performed prior to the initiation of the procedure. Local anesthesia was provided with 1% lidocaine with epinephrine. Under direct ultrasound guidance, an 18 gauge core needle device was utilized to obtain to obtain 3 core needle biopsies of the RIGHT supraclavicular nodal mass. The samples were placed in saline and submitted to pathology. The needle was removed and superficial hemostasis was achieved with manual compression. Post procedure scan was negative for significant hematoma. A dressing was applied. The patient tolerated the procedure well without immediate postprocedural complication. IMPRESSION: Successful core biopsy of RIGHT supraclavicular nodal mass. Roanna Banning, MD [facility] Electronically Signed   By: Roanna Banning M.D.   On: 02/26/2023 10:15   ECHOCARDIOGRAM COMPLETE  Result Date: 02/24/2023    ECHOCARDIOGRAM REPORT   Patient Name:   Ana Curry Date of Exam:  02/24/2023 Medical Rec #:  [id number]         Height:       68.0 in Accession #:    [id number]        Weight:       157.0 lb Date of Birth:  1971-03-17        BSA:          1.844 m Patient Age:    51 years          BP:           111/73 mmHg Patient Gender: F                 HR:           88 bpm. Exam Location:  Inpatient Procedure: 2D Echo, Cardiac Doppler and Color Doppler Indications:    A-FIB  History:        Patient has no prior history of Echocardiogram examinations.                 Arrythmias:Atrial Fibrillation; Risk Factors:Current Smoker.  Sonographer:    Darlys Gales Referring Phys: [phone] JACOB J STINSON IMPRESSIONS  1. Left ventricular ejection fraction, by estimation, is 60 to 65%. The left ventricle has normal function. The left ventricle has no regional wall motion abnormalities. Left ventricular diastolic parameters are indeterminate.  2. Right ventricular systolic function is normal. The right ventricular size is normal. Tricuspid regurgitation signal is inadequate for assessing PA pressure.  3. The mitral valve is normal in structure. No evidence of mitral valve regurgitation.  4. The aortic valve is grossly normal. Aortic valve regurgitation is not visualized.  5. The inferior vena cava is dilated in size with <50% respiratory variability, suggesting right atrial pressure of 15 mmHg. FINDINGS  Left Ventricle: Left ventricular ejection fraction, by estimation, is 60 to 65%. The left ventricle has normal function. The left ventricle has no regional wall motion abnormalities. The left ventricular internal cavity size was normal in size. There is  no left ventricular hypertrophy. Left ventricular diastolic parameters are indeterminate. Right Ventricle: The right ventricular size is normal. Right ventricular systolic function is normal. Tricuspid regurgitation signal is inadequate for assessing PA pressure. Left Atrium: Left atrial size was normal in size. Right Atrium: Right atrial size was normal in  size. Pericardium: There is no evidence of pericardial effusion. Mitral Valve: The mitral valve is normal in structure. No evidence of mitral valve regurgitation. Tricuspid  Valve: Tricuspid valve regurgitation is not demonstrated. Aortic Valve: The aortic valve is grossly normal. Aortic valve regurgitation is not visualized. Aortic valve mean gradient measures 5.0 mmHg. Aortic valve peak gradient measures 7.6 mmHg. Aortic valve area, by VTI measures 2.82 cm. Pulmonic Valve: Pulmonic valve regurgitation is not visualized. Aorta: The aortic root and ascending aorta are structurally normal, with no evidence of dilitation. Venous: The inferior vena cava is dilated in size with less than 50% respiratory variability, suggesting right atrial pressure of 15 mmHg. IAS/Shunts: The interatrial septum was not well visualized.  LEFT VENTRICLE PLAX 2D LVIDd:         5.10 cm   Diastology LVIDs:         3.00 cm   LV e' medial:    7.07 cm/s LV PW:         0.80 cm   LV E/e' medial:  12.3 LV IVS:        0.70 cm   LV e' lateral:   10.20 cm/s LVOT diam:     1.80 cm   LV E/e' lateral: 8.5 LV SV:         76 LV SV Index:   41 LVOT Area:     2.54 cm  RIGHT VENTRICLE             IVC RV S prime:     15.10 cm/s  IVC diam: 2.50 cm TAPSE (M-mode): 2.5 cm LEFT ATRIUM             Index        RIGHT ATRIUM           Index LA Vol (A2C):   21.7 ml 11.77 ml/m  RA Area:     14.00 cm LA Vol (A4C):   27.3 ml 14.81 ml/m  RA Volume:   34.60 ml  18.76 ml/m LA Biplane Vol: 24.7 ml 13.40 ml/m  AORTIC VALVE AV Area (Vmax):    2.42 cm AV Area (Vmean):   2.26 cm AV Area (VTI):     2.82 cm AV Vmax:           138.00 cm/s AV Vmean:          102.000 cm/s AV VTI:            0.269 m AV Peak Grad:      7.6 mmHg AV Mean Grad:      5.0 mmHg LVOT Vmax:         131.00 cm/s LVOT Vmean:        90.500 cm/s LVOT VTI:          0.298 m LVOT/AV VTI ratio: 1.11  AORTA Ao Root diam: 3.20 cm Ao Asc diam:  3.50 cm MITRAL VALVE MV Area (PHT): 5.23 cm     SHUNTS MV Decel  Time: 145 msec     Systemic VTI:  0.30 m MV E velocity: 87.00 cm/s   Systemic Diam: 1.80 cm MV A velocity: 102.00 cm/s MV E/A ratio:  0.85 Photographer signed by Carolan Clines Signature Date/Time: 02/24/2023/3:44:27 PM    Final    CT Angio Chest PE W and/or Wo Contrast  Result Date: 02/23/2023 CLINICAL DATA:  Shortness of breath, right neck mass, chest mass, headache * Tracking Code: BO * EXAM: CT ANGIOGRAPHY CHEST WITH CONTRAST TECHNIQUE: Multidetector CT imaging of the chest was performed using the standard protocol during bolus administration of intravenous contrast. Multiplanar CT image reconstructions and MIPs were obtained to evaluate the  vascular anatomy. RADIATION DOSE REDUCTION: This exam was performed according to the departmental dose-optimization program which includes automated exposure control, adjustment of the mA and/or kV according to patient size and/or use of iterative reconstruction technique. CONTRAST:  75mL OMNIPAQUE IOHEXOL 350 MG/ML SOLN COMPARISON:  None Available. FINDINGS: Cardiovascular: No filling defect is identified in the pulmonary arterial tree to suggest pulmonary embolus. Mild atheromatous vascular calcification of the aortic arch. The large mediastinal mass bulges into the SVC posteriorly for example on image 128 series 4, probably from extrinsic compression, definite invasion is not observed. Because of lack of complete opacification from pulmonary arterial timing, patency of the left brachiocephalic vein is uncertain. Moderate pericardial effusion. Mediastinum/Nodes: Large right infrahilar and hilar mass extending into the mediastinum and tracking all the way to the thoracic inlet. This extends around the right mainstem bronchus, right upper lobe bronchus, and bronchus intermedius as well as the right lower lobe and right middle lobe bronchi, and likewise surrounds and narrows the right pulmonary artery and its branches. In the subcarinal region, the conglomerate  mass measures about 7.9 by 5.6 cm (image 144, series 4) and the mass flattens and narrows the SVC without overt occlusion. In the right infrahilar region, the observed mass measures 4.7 by 3.9 cm on image 179 series 4. Presumed lymph node anterior to the SVC in the upper chest measures 2.6 cm in short axis on image 36 series 3. A right lower neck level IV mass/lymph node measures 3.4 cm in short axis on image 10 series 3. Lungs/Pleura: The mass in the right chest displaces the upper trachea to the left. There is substantial narrowing of the right upper lobe bronchus, right middle lobe bronchus, and severe narrowing of the right lower lobe bronchus due to the surrounding mass. Patchy regions of nodularity in the right lower lobe distal to the mass may represent postobstructive pneumonitis or satellite tumor nodules. Centrilobular emphysema. Scarring or atelectasis in the right middle lobe and right upper lobe anteriorly. Mild dependent atelectasis in both lower lobes. Trace right pleural effusion, image 50 series 3. Upper Abdomen: Small nonspecific hypodense lesion in the left hepatic lobe 0.9 cm in long axis on image 96 series 3. Musculoskeletal: Unremarkable Review of the MIP images confirms the above findings. IMPRESSION: 1. Large right infrahilar and hilar mass extending into the mediastinum and tracking all the way to the thoracic inlet. This extends around the right mainstem bronchus, right upper lobe bronchus, and bronchus intermedius as well as the right lower lobe and right middle lobe bronchi, and likewise surrounds and narrows the right pulmonary artery and its branches. The mass flattens and narrows the SVC without overt occlusion, and is confluent with bulky right level IV neck adenopathy. Top differential diagnostic considerations include lung cancer (such as squamous cell or small cell) versus extensive thoracic and right lower neck lymphoma. 2. Patchy regions of nodularity in the right lower lobe  distal to the mass may represent postobstructive pneumonitis or satellite tumor nodules. 3. Presumed lymph node anterior to the SVC in the upper chest measures 2.6 cm in short axis. 4. A right lower neck level IV mass/lymph node measures 3.4 cm in short axis, compatible with malignancy. 5. Moderate pericardial effusion. 6. Trace right pleural effusion. 7. Centrilobular emphysema. 8. Small nonspecific hypodense lesion in the left hepatic lobe 0.9 cm in long axis. 9. No pulmonary embolus is identified. 10. Aortic atherosclerosis. Aortic Atherosclerosis (ICD10-I70.0) and Emphysema (ICD10-J43.9). Electronically Signed   By: Annitta Needs.D.  On: 02/23/2023 13:39   CT Soft Tissue Neck W Contrast  Result Date: 02/23/2023 CLINICAL DATA:  Neck mass, nonpulsatile right sided neck mass EXAM: CT NECK WITH CONTRAST TECHNIQUE: Multidetector CT imaging of the neck was performed using the standard protocol following the bolus administration of intravenous contrast. RADIATION DOSE REDUCTION: This exam was performed according to the departmental dose-optimization program which includes automated exposure control, adjustment of the mA and/or kV according to patient size and/or use of iterative reconstruction technique. CONTRAST:  75mL OMNIPAQUE IOHEXOL 350 MG/ML SOLN COMPARISON:  None Available. FINDINGS: Pharynx and larynx: Normal. No mass or swelling. Salivary glands: No inflammation, mass, or stone. Thyroid: Normal. Lymph nodes: There is bulky lower cervical and mediastinal lymphadenopathy involving the right level 3 and level 4 lymph node stations, the right supraclavicular region, and the anterior and posterior mediastinum. Cervical lymphadenopathy significant mass effect on the right internal jugular vein (series 5, image 68). Vascular: Negative. Mass effect on the right internal jugular vein, as above Limited intracranial: Negative. Visualized orbits: Negative. Mastoids and visualized paranasal sinuses: Moderate  left-sided mastoid effusion. There is pneumatization of the petrous apices with air-fluid levels. Paranasal sinuses are clear. Orbits are unremarkable. Skeleton: No acute or aggressive process. Upper chest: Mild paraseptal emphysema. See same day CT chest for additional findings Other: None. IMPRESSION: 1. Bulky right lower cervical and mediastinal lymphadenopathy, concerning for metastatic disease or lymphoma. 2. See separate CT chest for additional findings. Emphysema (ICD10-J43.9). Electronically Signed   By: Lorenza Cambridge M.D.   On: 02/23/2023 13:17   CT Head Wo Contrast  Result Date: 02/23/2023 CLINICAL DATA:  Headache EXAM: CT HEAD WITHOUT CONTRAST TECHNIQUE: Contiguous axial images were obtained from the base of the skull through the vertex without intravenous contrast. RADIATION DOSE REDUCTION: This exam was performed according to the departmental dose-optimization program which includes automated exposure control, adjustment of the mA and/or kV according to patient size and/or use of iterative reconstruction technique. COMPARISON:  None Available. FINDINGS: Brain: There is no acute intracranial hemorrhage, extra-axial fluid collection, or acute infarct Parenchymal volume is normal. The ventricles are normal in size. Gray-white differentiation is preserved The pituitary and suprasellar region are normal. There is no mass lesion. There is no mass effect or midline shift. Vascular: No hyperdense vessel or unexpected calcification. Skull: Normal. Negative for fracture or focal lesion. Sinuses/Orbits: The imaged paranasal sinuses are clear. The globes and orbits are unremarkable. Other: There are small bilateral mastoid effusions. IMPRESSION: 1. No acute intracranial pathology. 2. Small bilateral mastoid effusions. Electronically Signed   By: Lesia Hausen M.D.   On: 02/23/2023 13:15   DG Chest Port 1 View  Result Date: 02/23/2023 CLINICAL DATA:  Sepsis EXAM: PORTABLE CHEST 1 VIEW COMPARISON:  X-ray  01/09/2023 FINDINGS: No pneumothorax, effusion or edema. Calcified aorta. Overlapping cardiac leads. There is new widening of the mediastinum compared to prior x-ray and fullness of the right lung hilum. Recommend further evaluation with CT to delineate for potential mass. IMPRESSION: New widening of the mediastinum with masslike fullness in the right lung hilum. Recommend follow up contrast CT to further delineate Electronically Signed   By: Karen Kays M.D.   On: 02/23/2023 13:11

## 2023-03-10 ENCOUNTER — Other Ambulatory Visit: Payer: Self-pay

## 2023-03-10 DIAGNOSIS — J9601 Acute respiratory failure with hypoxia: Secondary | ICD-10-CM | POA: Diagnosis not present

## 2023-03-10 LAB — CBC WITH DIFFERENTIAL/PLATELET
Abs Immature Granulocytes: 0.03 10*3/uL (ref 0.00–0.07)
Basophils Absolute: 0 10*3/uL (ref 0.0–0.1)
Basophils Relative: 0 %
Eosinophils Absolute: 0 10*3/uL (ref 0.0–0.5)
Eosinophils Relative: 1 %
HCT: 24.9 % — ABNORMAL LOW (ref 36.0–46.0)
Hemoglobin: 8.2 g/dL — ABNORMAL LOW (ref 12.0–15.0)
Immature Granulocytes: 2 %
Lymphocytes Relative: 41 %
Lymphs Abs: 0.7 10*3/uL (ref 0.7–4.0)
MCH: 31.7 pg (ref 26.0–34.0)
MCHC: 32.9 g/dL (ref 30.0–36.0)
MCV: 96.1 fL (ref 80.0–100.0)
Monocytes Absolute: 0 10*3/uL — ABNORMAL LOW (ref 0.1–1.0)
Monocytes Relative: 2 %
Neutro Abs: 0.9 10*3/uL — ABNORMAL LOW (ref 1.7–7.7)
Neutrophils Relative %: 54 %
Platelets: 29 10*3/uL — CL (ref 150–400)
RBC: 2.59 MIL/uL — ABNORMAL LOW (ref 3.87–5.11)
RDW: 14.7 % (ref 11.5–15.5)
WBC: 1.7 10*3/uL — ABNORMAL LOW (ref 4.0–10.5)
nRBC: 0 % (ref 0.0–0.2)

## 2023-03-10 MED ORDER — DIPHENHYDRAMINE HCL 12.5 MG/5ML PO ELIX
12.5000 mg | ORAL_SOLUTION | Freq: Four times a day (QID) | ORAL | Status: DC | PRN
  Administered 2023-03-11: 12.5 mg via ORAL
  Filled 2023-03-10: qty 5

## 2023-03-10 MED ORDER — SODIUM CHLORIDE 0.9 % IV SOLN: INTRAVENOUS | Status: DC

## 2023-03-10 MED ORDER — SODIUM CHLORIDE 0.9% FLUSH: 9.0000 mL | INTRAVENOUS | Status: DC | PRN

## 2023-03-10 MED ORDER — NALOXONE HCL 0.4 MG/ML IJ SOLN: 0.4000 mg | INTRAMUSCULAR | Status: DC | PRN

## 2023-03-10 MED ORDER — DIPHENHYDRAMINE HCL 50 MG/ML IJ SOLN: 12.5000 mg | Freq: Four times a day (QID) | INTRAMUSCULAR | Status: DC | PRN

## 2023-03-10 MED ORDER — MAGNESIUM HYDROXIDE 400 MG/5ML PO SUSP
30.0000 mL | Freq: Two times a day (BID) | ORAL | Status: AC
  Administered 2023-03-10 – 2023-03-11 (×2): 30 mL via ORAL
  Filled 2023-03-10 (×2): qty 30

## 2023-03-10 MED ORDER — HYDROMORPHONE 1 MG/ML IV SOLN
INTRAVENOUS | Status: DC
  Administered 2023-03-10 (×2): 30 mg via INTRAVENOUS
  Administered 2023-03-11: 0.9 mg via INTRAVENOUS
  Administered 2023-03-11: 30 mg via INTRAVENOUS
  Filled 2023-03-10: qty 30

## 2023-03-10 NOTE — Progress Notes (Signed)
PROGRESS NOTE  Ana Curry  DOB: 1971-01-03  PCP: [facility] ZOX:[id number]  DOA: 03/06/2023  LOS: 4 days  Hospital Day: 5  Brief narrative: Ana Curry is a 52 y.o. female with PMH significant for lifelong smoking who was recently hospitalized 6/14 to 6/24 for worsening shortness of breath for 2 months.  CT chest at that time showed right infrahilar/hilar mass that extended around the right mainstem bronchus, right upper lobe and all the way to the thoracic inlet causing SVC syndrome.  She was seen by oncology. 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  CT abdomen pelvis and MRI brain did not show any metastatic disease. Bone scan showed subtle asymmetric uptake involving proximal left tibia. 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 6/21, started on chemotherapy with first cycle on 6/21.   6/21 to 6/24, underwent radiation treatment on 6/21 and 6/24. 6/22, Port-A-Cath was placed 6/24, discharged home on home oxygen to follow-up with oncology as an outpatient. Of note, during that hospital stay, patient also was noted to be in new onset A-fib with RVR.  She was started on oral Cardizem.  Anticoagulation was not restarted because of thrombocytopenia.  6/25, patient was seen at radiation clinic.  Noted to be ill-appearing, hypotensive, tachycardic, short of breath, had generalized weakness and hence sent to the ED.    In the ED, it was found that they had BP 97/68, pulse 101, RR 22, temp 98.2 F, SpO2 93% on 3 L O2 via [place].  Significant findings included WBC 4.7, hemoglobin 10.0, platelets 33,000, sodium 128, potassium 3.6, bicarb 26, BUN 17, creatinine 0.76, serum glucose 97. UA negative. CT chest/abdomen/pelvis obtained. No significant change in size of extensive confluent lymphadenopathy extending from right supraclavicular region through the mediastinum and right hilum. Stable extrinsic compression of the right internal  jugular vein, left brachiocephalic vein, and SVC s/p Port-A-Cath placement. No evidence of large vessel occlusion or intravascular thrombus. No acute findings or evidence of metastatic disease in the abdomen or pelvis. Mildly increased moderate right and small left pleural effusions noted with increased compressive atelectasis in the right middle lobe. Additionally increased patchy groundglass opacities throughout both lungs noted. Stable moderate pericardial effusion seen.   She was given IV hydration, IV pain meds, IV antiemetics Admitted to [facility] Oncology was consulted.  Subjective: Patient was seen and examined this morning. Feels miserable in pain.  Tachycardic, on 8 L oxygen by nasal cannula. Platelet count low. Family at bedside. Patient requires PCA pump  Assessment and plan: Acute respiratory failure with hypoxia Extensive stage  Bilateral pleural effusion Recently discharged on home oxygen, readmitted with worsening shortness of breath 6/28, thoracentesis was done by IR, 1.1 L removed. Currently on supplemental oxygen at 8 L. CT chest with right lung small cell cancer with extension of right infrahilar, right mainstem bronchus, right upper lobe and thoracic inlet adenopathy Bone marrow biopsy as well showed metastasis.  Oncology following. Plan to continue radiation.  Immunotherapy on hold Continue DuoNeb   Cancer associated pain and anxiety Complains of pain throughout whole body today.  Currently on OxyContin 10 mg BID as well as Valium scheduled twice daily. Complains of significant pain today.  Started on Dilaudid PCA.  Continue to monitor.  Thrombocytopenia Low platelets due to bone marrow involvement.  No active bleeding. 1 unit platelet transfusion given on 6/27. Platelet level trending down. Recent Labs  Lab 03/04/23 0500 03/05/23 8119 03/06/23 1614 03/07/23 0525 03/08/23 0323 03/09/23  0341 03/10/23 0340  PLT 42* 33* 33* 31* 27* 40* 29*    Moderate  pericardial effusion: Stable pericardial effusion noted on CT.  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   stable findings on repeat CT imaging.  Has been undergoing radiation treatment.  Hyponatremia Likely due to poor intake. Recent Labs  Lab 03/04/23 0500 03/05/23 0822 03/06/23 1614 03/07/23 0525 03/08/23 0323 03/09/23 0341  NA 133* 133* 128* 130* 134* 132*     Paroxysmal atrial fibrillation: Developed A-fib RVR on recent admission.   Remains tachycardic, but gradually slowing down.  Continue amiodarone Not on anticoagulation due to thrombocytopenia.    Opiate related constipation Constipation more than 2 weeks Avoiding suppository or enema because of neutropenia Continue miralax, senna.  Add milk of magnesia today.   Mobility: Encourage ambulation  Goals of care   Code Status: Full Code     DVT prophylaxis:  SCDs Start: 03/06/23 1947   Antimicrobials: None Fluid: NS at 125 mill per hour Consultants: Oncology Family Communication: Multiple family members at bedside  Status: Patient Level of care:  Progressive   Patient from: Home Anticipated d/c to: Pending clinical course Needs to continue in-hospital care:  Significant pain.  Requiring IV Dilaudid by PCA pump     Diet:  Diet Order             Diet regular Fluid consistency: Thin  Diet effective now                   Scheduled Meds:  amiodarone  200 mg Oral BID   Chlorhexidine Gluconate Cloth  6 each Topical Daily   diazepam  10 mg Oral BID   HYDROmorphone   Intravenous Q4H   lactulose  10 g Oral TID   nicotine  14 mg Transdermal Daily   oxyCODONE  10 mg Oral Q12H   polyethylene glycol  17 g Oral BID   senna-docusate  1 tablet Oral BID   sodium chloride flush  3 mL Intravenous Q12H   Tbo-Filgrastim  300 mcg Subcutaneous q1800    PRN meds: acetaminophen **OR** acetaminophen, albuterol, bisacodyl, dextromethorphan-guaiFENesin, diphenhydrAMINE **OR** diphenhydrAMINE, Glycerin  (Adult), ipratropium-albuterol, naloxone **AND** sodium chloride flush, ondansetron (ZOFRAN) IV, oxyCODONE   Infusions:   sodium chloride 125 mL/hr at 03/10/23 1249     Antimicrobials: Anti-infectives (From admission, onward)    None       Nutritional status:  Body mass index is 26.21 kg/m.          Objective: Vitals:   03/10/23 1252 03/10/23 1259  BP:  (!) 89/64  Pulse:    Resp:    Temp:    SpO2: 90%     Intake/Output Summary (Last 24 hours) at 03/10/2023 1607 Last data filed at 03/09/2023 2200 Gross per 24 hour  Intake 240 ml  Output --  Net 240 ml    Filed Weights   03/06/23 1320 03/06/23 2315 03/09/23 0500  Weight: 71.2 kg 77.1 kg 78.2 kg   Weight change:  Body mass index is 26.21 kg/m.   Physical Exam: General exam: Pleasant, middle-aged Caucasian female.  In significant pain, anxiety  Skin: No rashes, lesions or ulcers. HEENT: Atraumatic, normocephalic, no obvious bleeding Lungs: Diminished entry in both bases. CVS: Persistent tachycardia, A-fib, no murmur GI/Abd soft, mild diffuse tenderness, distention present, bowel sound present CNS : Alert, awake, oriented x 3 psychiatry: Mood appropriate Extremities: No pedal edema, no calf tenderness  Data Review: I have personally reviewed the laboratory  data and studies available.  F/u labs ordered Unresulted Labs (From admission, onward)     Start     Ordered   03/08/23 0500  CBC with Differential/Platelet  Daily,   R     Question:  Specimen collection method  Answer:  IV Team=IV Team collect   03/07/23 1007            Total time spent in review of labs and imaging, patient evaluation, formulation of plan, documentation and communication with family: 55 minutes  Signed, Lorin Glass, MD [facility] 03/10/2023

## 2023-03-10 NOTE — Progress Notes (Signed)
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 Nyra Capes such and according to the patient. No tumor lysis labs drawn today however in the past 1 week there appears to be no evidence of tumor lysis  Pain from procedure/lower back pain Could be an element of bone pain from cancer involvement of her bone marrow She will continue pain medications as needed We discussed risk of constipation with chronic pain medicine She once again complains of constipation today.  Bowel sounds are very minimal.  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 Recommend G-CSF support daily until [facility] is greater than 1.5 Will check CBC daily and transfuse if signs of bleeding or platelet count less than 10,000   Discharge planning Recommend consideration for skilled nursing facility as part of her discharge planning Due to progressive pancytopenia, I anticipate she will be here throughout the weekend.  It is not safe to be discharged until her platelet counts not trending up without transfusion support which I anticipate will be sometime next week  Patient had several questions today about her shortness of breath, her swelling, constipation and her severe fatigue.  We discussed that most of her symptoms are related to the cancer itself as well as pain medication causing constipation likely.  All her questions were answered to the best of my knowledge  All questions were answered. The patient  knows to call the clinic with any problems, questions or concerns.   The total time spent in the appointment was 35 minutes encounter with patients including review of chart and various tests results, discussions about plan of care and coordination of care plan  Rachel Moulds, MD 03/10/2023 6:08 PM  Subjective:  She was seen in the room.  Family by the bedside.  She inquired about the timing of thoracentesis.  Platelet count has improved with platelet transfusion but white count remain low She has no bowel movement for over 5 days  Objective:  Vitals:   03/10/23 1259 03/10/23 1702  BP: (!) 89/64   Pulse:    Resp:  18  Temp:    SpO2:  90%     Intake/Output Summary (Last 24 hours) at 03/10/2023 1808 Last data filed at 03/10/2023 1702 Gross per 24 hour  Intake 2025.38 ml  Output --  Net 2025.38 ml    GENERAL:alert, no distress and comfortable.  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  No prominent veins over the chest wall.  No asymmetrical lower extremity swelling. Chest: Scattered wheezing present bilaterally. No lower extremity edema NEURO: alert & oriented x 3 with fluent speech, no focal motor/sensory deficits   Labs:  Recent Labs    03/05/23 0822 03/06/23 1614 03/07/23 0525 03/08/23 0323 03/09/23 0341  NA 133* 128* 130* 134* 132*  K 3.6 3.6 3.6 3.7 3.8  CL 101 95* 95* 98 96*  CO2 24 26 25 27 25   GLUCOSE 113* 97 118* 109* 108*  BUN 15 17 19  16 15  CREATININE 0.83 0.76 0.91 0.93 0.82  CALCIUM 9.4 8.9 9.2 9.0 9.3  GFRNONAA >60 >60 >60 >60 >60  PROT 5.9* 5.8* 5.6*  --   --   ALBUMIN 2.8* 2.8* 2.6*  --   --   AST 39 44* 45*  --   --   ALT 26 24 24   --   --   ALKPHOS 86 84 80  --   --   BILITOT 0.5 0.9 0.7  --   --     Studies:  US THORACENTESIS ASP PLEURAL SPACE W/IMG GUIDE  Result Date: 03/09/2023 INDICATION: Patient with 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 Request for diagnostic and therapeutic right thoracentesis. EXAM: ULTRASOUND GUIDED DIAGNOSTIC and THERAPEUTIC RIGHT THORACENTESIS MEDICATIONS: 6 mL 1% lidocaine. COMPLICATIONS: None immediate. PROCEDURE: An ultrasound guided thoracentesis was thoroughly discussed with the patient and questions answered. The benefits, risks, alternatives and complications were also discussed. The patient understands and wishes to proceed with the procedure. Written consent was obtained. Ultrasound was performed to localize and mark an adequate pocket of fluid in the right chest. The area was then prepped and draped in the normal sterile fashion. 1% Lidocaine was used for local anesthesia. Under ultrasound guidance a 6 Fr Safe-T-Centesis catheter was introduced. Thoracentesis was performed. The catheter was removed and a dressing applied. FINDINGS: A total of approximately 1.1 L of clear yellow fluid was removed. Samples were sent to the laboratory as requested by the clinical team. IMPRESSION: Successful diagnostic and therapeutic RIGHT thoracentesis yielding 1.1 L of pleural fluid. Performed by Lynnette Caffey, PA-C Electronically Signed   By: Roanna Banning M.D.   On: 03/09/2023 14:34   DG Chest Port 1 View  Result Date: 03/09/2023 CLINICAL DATA:  Post right-sided thoracentesis. EXAM: PORTABLE CHEST 1 VIEW COMPARISON:  03/06/2023 FINDINGS: Left IJ Port-A-Cath unchanged. Lungs are adequately inflated. Slight worsening opacification over the right base likely residual pleural effusion with associated atelectasis. Left lung is clear. Cardiomediastinal silhouette and remainder of the exam is unchanged. IMPRESSION: Slight worsening opacification over the right base likely residual pleural effusion with associated atelectasis. No pneumothorax. Electronically Signed   By: Elberta Fortis M.D.   On: 03/09/2023 13:30   CT Chest W Contrast  Result Date: 03/06/2023 CLINICAL DATA:  Small cell lung cancer. Increasing back pain  and extreme fatigue. 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 * Tracking Code: BO * EXAM: CT CHEST, ABDOMEN, AND PELVIS WITH CONTRAST TECHNIQUE: Multidetector CT imaging of the chest, abdomen and pelvis was performed following the standard protocol during bolus administration of intravenous contrast. RADIATION DOSE REDUCTION: This exam was performed according to the departmental dose-optimization program which includes automated exposure control, adjustment of the mA and/or kV according to patient size and/or use of iterative reconstruction technique. CONTRAST:  OMNIPAQUE IOHEXOL 300 MG/ML  SOLN COMPARISON:  Chest CT 02/23/2023.  Abdominopelvic CT 02/27/2023. FINDINGS: CT CHEST FINDINGS Cardiovascular: A left subclavian Port-A-Cath has been placed, extending into the superior aspect of the right atrium. There is significant extrinsic compression of the right internal jugular, left brachiocephalic vein and SVC, similar to previous CT. There is also extrinsic mass effect on the right pulmonary artery and veins, also similar. No large vessel occlusion or intravascular thrombus identified. The heart size is stable. A moderate size pericardial effusion appears unchanged. Mediastinum/Nodes: Again demonstrated is extensive confluent lymphadenopathy extending from the right supraclavicular region through the mediastinum into the right  hilum. There is a right supraclavicular component which is incompletely visualized, measuring approximately 4.9 x 3.7 cm on image 1/2. There is a right paratracheal component measuring 4.8 x 4.6 cm, and this has a 3.4 x 2.7 cm component extending anterior to the brachiocephalic veins on images 18/2. Overall, these lymph nodes are similar in size to the prior chest CT, although demonstrate lower central density suggesting response to treatment and necrosis. No progressive adenopathy identified. Stable extrinsic narrowing of the central right-sided airways without occlusion. The esophagus  appears unremarkable. Lungs/Pleura: New moderate right and small left pleural effusions (compared with prior chest CT; minimally enlarged compared with more recent abdominal CT). Associated increased compressive atelectasis in the right middle lobe. There are additional increased patchy ground-glass opacities throughout both lungs which may reflect edema or posterior obstructive pneumonitis. There is asymmetric central airway thickening on the right. Underlying mild to moderate centrilobular emphysema. No pneumothorax. Musculoskeletal/Chest wall: No chest wall mass or suspicious osseous findings. CT ABDOMEN AND PELVIS FINDINGS Hepatobiliary: The liver is normal in density without suspicious focal abnormality. Unchanged 9 mm low-density lesion in the left hepatic lobe on image 56/2, likely a cyst. No evidence of gallstones, gallbladder wall thickening or biliary dilatation. Pancreas: Unremarkable. No pancreatic ductal dilatation or surrounding inflammatory changes. Spleen: Normal in size without focal abnormality. Adrenals/Urinary Tract: Both adrenal glands appear normal. No evidence of urinary tract calculus, suspicious renal lesion or hydronephrosis. The bladder appears normal for its degree of distention. Stomach/Bowel: No enteric contrast administered. The stomach appears unremarkable for its degree of distension. No evidence of bowel wall thickening, distention or surrounding inflammatory change. Prominent stool throughout the colon. Vascular/Lymphatic: There are no enlarged abdominal or pelvic lymph nodes. Aortic and branch vessel atherosclerosis without evidence of aneurysm or large vessel occlusion. Reproductive: The uterus and ovaries appear normal. No adnexal mass. Other: No evidence of abdominal wall mass or hernia. No ascites or pneumoperitoneum. Musculoskeletal: No acute or significant osseous findings. IMPRESSION: 1. No significant change in size of extensive confluent lymphadenopathy extending from the  right supraclavicular region through the mediastinum and right hilum. The lymph nodes demonstrate lower central density suggesting response to treatment and necrosis. 2. Grossly stable extrinsic compression of the right internal jugular vein, left brachiocephalic vein and SVC status post Port-A-Cath placement. These findings may contribute to symptoms of SVC syndrome. No evidence of large vessel occlusion or intravascular thrombus. 3. No acute findings or evidence of metastatic disease in the abdomen or pelvis. 4. Mildly increased moderate right and small left pleural effusions with increased compressive atelectasis in the right middle lobe. There are additional increased patchy ground-glass opacities throughout both lungs which may reflect edema or posterior obstructive pneumonitis. 5. Stable moderate pericardial effusion. 6. Aortic Atherosclerosis (ICD10-I70.0) and Emphysema (ICD10-J43.9). Electronically Signed   By: Carey Bullocks M.D.   On: 03/06/2023 18:17   CT ABDOMEN PELVIS W CONTRAST  Result Date: 03/06/2023 CLINICAL DATA:  Small cell lung cancer. Increasing back pain and extreme fatigue. 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 * Tracking Code: BO * EXAM: CT CHEST, ABDOMEN, AND PELVIS WITH CONTRAST TECHNIQUE: Multidetector CT imaging of the chest, abdomen and pelvis was performed following the standard protocol during bolus administration of intravenous contrast. RADIATION DOSE REDUCTION: This exam was performed according to the departmental dose-optimization program which includes automated exposure control, adjustment of the mA and/or kV according to patient size and/or use of iterative reconstruction technique. CONTRAST:  OMNIPAQUE IOHEXOL 300 MG/ML  SOLN COMPARISON:  Chest CT  02/23/2023.  Abdominopelvic CT 02/27/2023. FINDINGS: CT CHEST FINDINGS Cardiovascular: A left subclavian Port-A-Cath has been placed, extending into the superior aspect of the right atrium. There is significant extrinsic  compression of the right internal jugular, left brachiocephalic vein and SVC, similar to previous CT. There is also extrinsic mass effect on the right pulmonary artery and veins, also similar. No large vessel occlusion or intravascular thrombus identified. The heart size is stable. A moderate size pericardial effusion appears unchanged. Mediastinum/Nodes: Again demonstrated is extensive confluent lymphadenopathy extending from the right supraclavicular region through the mediastinum into the right hilum. There is a right supraclavicular component which is incompletely visualized, measuring approximately 4.9 x 3.7 cm on image 1/2. There is a right paratracheal component measuring 4.8 x 4.6 cm, and this has a 3.4 x 2.7 cm component extending anterior to the brachiocephalic veins on images 18/2. Overall, these lymph nodes are similar in size to the prior chest CT, although demonstrate lower central density suggesting response to treatment and necrosis. No progressive adenopathy identified. Stable extrinsic narrowing of the central right-sided airways without occlusion. The esophagus appears unremarkable. Lungs/Pleura: New moderate right and small left pleural effusions (compared with prior chest CT; minimally enlarged compared with more recent abdominal CT). Associated increased compressive atelectasis in the right middle lobe. There are additional increased patchy ground-glass opacities throughout both lungs which may reflect edema or posterior obstructive pneumonitis. There is asymmetric central airway thickening on the right. Underlying mild to moderate centrilobular emphysema. No pneumothorax. Musculoskeletal/Chest wall: No chest wall mass or suspicious osseous findings. CT ABDOMEN AND PELVIS FINDINGS Hepatobiliary: The liver is normal in density without suspicious focal abnormality. Unchanged 9 mm low-density lesion in the left hepatic lobe on image 56/2, likely a cyst. No evidence of gallstones, gallbladder wall  thickening or biliary dilatation. Pancreas: Unremarkable. No pancreatic ductal dilatation or surrounding inflammatory changes. Spleen: Normal in size without focal abnormality. Adrenals/Urinary Tract: Both adrenal glands appear normal. No evidence of urinary tract calculus, suspicious renal lesion or hydronephrosis. The bladder appears normal for its degree of distention. Stomach/Bowel: No enteric contrast administered. The stomach appears unremarkable for its degree of distension. No evidence of bowel wall thickening, distention or surrounding inflammatory change. Prominent stool throughout the colon. Vascular/Lymphatic: There are no enlarged abdominal or pelvic lymph nodes. Aortic and branch vessel atherosclerosis without evidence of aneurysm or large vessel occlusion. Reproductive: The uterus and ovaries appear normal. No adnexal mass. Other: No evidence of abdominal wall mass or hernia. No ascites or pneumoperitoneum. Musculoskeletal: No acute or significant osseous findings. IMPRESSION: 1. No significant change in size of extensive confluent lymphadenopathy extending from the right supraclavicular region through the mediastinum and right hilum. The lymph nodes demonstrate lower central density suggesting response to treatment and necrosis. 2. Grossly stable extrinsic compression of the right internal jugular vein, left brachiocephalic vein and SVC status post Port-A-Cath placement. These findings may contribute to symptoms of SVC syndrome. No evidence of large vessel occlusion or intravascular thrombus. 3. No acute findings or evidence of metastatic disease in the abdomen or pelvis. 4. Mildly increased moderate right and small left pleural effusions with increased compressive atelectasis in the right middle lobe. There are additional increased patchy ground-glass opacities throughout both lungs which may reflect edema or posterior obstructive pneumonitis. 5. Stable moderate pericardial effusion. 6. Aortic  Atherosclerosis (ICD10-I70.0) and Emphysema (ICD10-J43.9). Electronically Signed   By: Carey Bullocks M.D.   On: 03/06/2023 18:17   DG Chest Portable 1 View  Result Date: 03/06/2023  CLINICAL DATA:  Fatigue and weakness.  On chemotherapy. EXAM: PORTABLE CHEST 1 VIEW COMPARISON:  Chest x-ray 02/28/2023 FINDINGS: Hyperinflation. Small right effusion with adjacent opacity similar to previous. Stable interstitial prominence. Stable cardiopericardial silhouette. No pneumothorax. Again fullness of the right lung hilum. Left IJ chest port in place tip at the SVC right atrial junction. The port is accessed. IMPRESSION: Persistent right-sided effusion adjacent lung opacity with fullness of the right side of the lung hilum and widening of the mediastinum. Hyperinflation. Chest port Electronically Signed   By: Karen Kays M.D.   On: 03/06/2023 16:50   IR IMAGING GUIDED PORT INSERTION  Result Date: 03/02/2023 INDICATION: History of metastatic lung cancer. In need of durable intravenous access for the initiation of chemotherapy. EXAM: IMPLANTED PORT A CATH PLACEMENT WITH ULTRASOUND AND FLUOROSCOPIC GUIDANCE COMPARISON:  Neck CT-02/23/2023 MEDICATIONS: None ANESTHESIA/SEDATION: Moderate (conscious) sedation was employed during this procedure as administered by the Interventional Radiology RN. A total of Benadryl 25 mg and Fentanyl 25 mcg was administered intravenously. Moderate Sedation Time: 23 minutes. The patient's level of consciousness and vital signs were monitored continuously by radiology nursing throughout the procedure under my direct supervision. CONTRAST:  None FLUOROSCOPY TIME:  24 seconds (4 mGy) COMPLICATIONS: None immediate. PROCEDURE: The procedure, risks, benefits, and alternatives were explained to the patient. Questions regarding the procedure were encouraged and answered. The patient understands and consents to the procedure. Given presence of known right supraclavicular bulky lymphadenopathy, the  decision was made to proceed with left internal jugular approach port a catheter placement. As such, the left neck and chest were prepped with chlorhexidine in a sterile fashion, and a sterile drape was applied covering the operative field. Maximum barrier sterile technique with sterile gowns and gloves were used for the procedure. A timeout was performed prior to the initiation of the procedure. Local anesthesia was provided with 1% lidocaine with epinephrine. After creating a small venotomy incision, a micropuncture kit was utilized to access the internal jugular vein. Real-time ultrasound guidance was utilized for vascular access including the acquisition of a permanent ultrasound image documenting patency of the accessed vessel. The microwire was utilized to measure appropriate catheter length. A subcutaneous port pocket was then created along the upper chest wall utilizing a combination of sharp and blunt dissection. The pocket was irrigated with sterile saline. A single lumen clear view power injectable port was chosen for placement. The 8 Fr catheter was tunneled from the port pocket site to the venotomy incision. The port was placed in the pocket. The external catheter was trimmed to appropriate length. At the venotomy, an 8 Fr peel-away sheath was placed over a guidewire under fluoroscopic guidance. The catheter was then placed through the sheath and the sheath was removed. Final catheter positioning was confirmed and documented with a fluoroscopic spot radiograph. The port was accessed with a Huber needle, aspirated and flushed with heparinized saline. The venotomy site was closed with an interrupted 4-0 Vicryl suture. The port pocket incision was closed with interrupted 2-0 Vicryl suture. Dermabond and Steri-strips were applied to both incisions. Dressings were applied. The patient tolerated the procedure well without immediate post procedural complication. FINDINGS: After catheter placement, the tip lies  within the superior cavoatrial junction. The catheter aspirates and flushes normally and is ready for immediate use. IMPRESSION: Successful placement of a left internal jugular approach power injectable Port-A-Cath. The catheter is ready for immediate use. Electronically Signed   By: Simonne Come M.D.   On: 03/02/2023 15:05  CT BONE MARROW BIOPSY & ASPIRATION  Result Date: 03/02/2023 INDICATION: History of metastatic lung cancer, now with thrombocytopenia. Please perform CT-guided bone marrow biopsy for tissue diagnostic purposes. EXAM: CT-GUIDED BONE MARROW BIOPSY AND ASPIRATION MEDICATIONS: None ANESTHESIA/SEDATION: Moderate (conscious) sedation was employed during this procedure as administered by the Interventional Radiology RN. A total of Benadryl 25 mg, Versed 1 mg and Fentanyl 50 mcg was administered intravenously. Moderate Sedation Time: 10 minutes. The patient's level of consciousness and vital signs were monitored continuously by radiology nursing throughout the procedure under my direct supervision. COMPLICATIONS: None immediate. PROCEDURE: Informed consent was obtained from the patient following an explanation of the procedure, risks, benefits and alternatives. The patient understands, agrees and consents for the procedure. All questions were addressed. A time out was performed prior to the initiation of the procedure. The patient was positioned prone and non-contrast localization CT was performed of the pelvis to demonstrate the iliac marrow spaces. The operative site was prepped and draped in the usual sterile fashion. Under sterile conditions and local anesthesia, a 22 gauge spinal needle was utilized for procedural planning. Next, an 11 gauge coaxial bone biopsy needle was advanced into the left iliac marrow space. Needle position was confirmed with CT imaging. Initially, a bone marrow aspiration was performed. Next, a bone marrow biopsy was obtained with the 11 gauge outer bone marrow device.  The needle was removed and superficial hemostasis was obtained with manual compression. A dressing was applied. The patient tolerated the procedure well without immediate post procedural complication. IMPRESSION: Successful CT guided left iliac bone marrow aspiration and core biopsy. Electronically Signed   By: Simonne Come M.D.   On: 03/02/2023 15:03   IR US CHEST  Result Date: 02/28/2023 CLINICAL DATA:  Evaluate for thoracentesis. EXAM: CHEST ULTRASOUND COMPARISON:  Chest radiograph 02/28/2023 FINDINGS: Small amount of right pleural fluid was identified. No percutaneous window for thoracentesis due to a flap of lung. Thoracentesis not performed. IMPRESSION: Small right pleural effusion. Thoracentesis not performed due to lack of a safe percutaneous window. Electronically Signed   By: Richarda Overlie M.D.   On: 02/28/2023 16:10   NM Bone Scan Whole Body  Result Date: 02/28/2023 CLINICAL DATA:  Lung cancer EXAM: NUCLEAR MEDICINE WHOLE BODY BONE SCAN TECHNIQUE: Whole body anterior and posterior images were obtained approximately 3 hours after intravenous injection of radiopharmaceutical. RADIOPHARMACEUTICALS:  21.0 mCi Technetium-5m MDP IV COMPARISON:  CT scan earlier June 2024 of multiple areas. FINDINGS: Physiologic distribution of radiotracer overall. There is some uptake involving the right midfoot consistent with known provided history of injury in the past. There is slight asymmetric uptake along the proximal left tibia from the other side. There is also some subtle areas of low uptake along the distal femoral metaphysis. Recommend dedicated x-rays. Mild areas of degenerative uptake such as along the extremities. Of note the kidneys are not as well seen as typical. Please correlate with clinical findings. IMPRESSION: Subtle asymmetric uptake involving the proximal left tibia. Slight low uptake along the distal femurs. Recommend dedicated x-rays if there is no known history. Atypical low uptake of the renal  parenchyma. The rest of the findings do not suggest super scan but please correlate with the patient's renal function and other history. This may be technical. Electronically Signed   By: Karen Kays M.D.   On: 02/28/2023 14:40   DG CHEST PORT 1 VIEW  Result Date: 02/28/2023 CLINICAL DATA:  Pleural effusion on right. EXAM: PORTABLE CHEST 1 VIEW COMPARISON:  Radiographs 02/27/2023 and 02/23/2023. Abdominal CT 02/27/2023 and chest CT 02/23/2023. FINDINGS: 0845 hours. Mild patient rotation to the right. Grossly stable heart size and mediastinal contours with known extensive mediastinal lymphadenopathy. There are enlarging right greater than left pleural effusions with increasing atelectasis at both lung bases. No pneumothorax. The bones appear unchanged. Telemetry leads overlie the chest. IMPRESSION: 1. Enlarging right greater than left pleural effusions with increasing bibasilar atelectasis. 2. Grossly stable mediastinal lymphadenopathy from recent chest CT, presumed lung cancer. Electronically Signed   By: Carey Bullocks M.D.   On: 02/28/2023 12:55   MR BRAIN W WO CONTRAST  Result Date: 02/28/2023 CLINICAL DATA:  Staging of small cell lung carcinoma EXAM: MRI HEAD WITHOUT AND WITH CONTRAST TECHNIQUE: Multiplanar, multiecho pulse sequences of the brain and surrounding structures were obtained without and with intravenous contrast. CONTRAST:  7mL GADAVIST GADOBUTROL 1 MMOL/ML IV SOLN COMPARISON:  None Available. FINDINGS: Brain: No acute infarct, mass effect or extra-axial collection. No acute or chronic hemorrhage. Normal white matter signal. On diffusion-weighted imaging and the FLAIR sequence, there are bilateral convexity signal abnormalities within the subdural space. The midline structures are normal. There is smooth, diffuse pachymeningeal contrast enhancement. There are no intraparenchymal contrast enhancing lesions. Vascular: Major flow voids are preserved. Skull and upper cervical spine: Normal  calvarium and skull base. Visualized upper cervical spine and soft tissues are normal. Sinuses/Orbits:Mastoid effusions. Paranasal sinuses are clear. Normal orbits. IMPRESSION: 1. No intraparenchymal metastatic disease. 2. Bilateral convexity signal abnormalities within the subdural space, which may indicate aging subdural blood. 3. Smooth, diffuse pachymeningeal contrast enhancement. 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 Metastatic disease felt less likely given the lack nodularity. Electronically Signed   By: Deatra Robinson M.D.   On: 02/28/2023 02:17   DG CHEST PORT 1 VIEW  Result Date: 02/27/2023 CLINICAL DATA:  Shortness of breath. EXAM: PORTABLE CHEST 1 VIEW COMPARISON:  February 23, 2023. FINDINGS: Stable cardiomegaly. Mild central pulmonary vascular congestion is noted. Minimal bibasilar pulmonary edema is noted with small right pleural effusion. Bony thorax is unremarkable. IMPRESSION: Stable cardiomegaly with mild central pulmonary vascular congestion. Minimal bibasilar pulmonary edema is noted with probable small right pleural effusion. Electronically Signed   By: Lupita Raider M.D.   On: 02/27/2023 16:27   CT ABDOMEN PELVIS W CONTRAST  Result Date: 02/27/2023 CLINICAL DATA:  Non-small-cell lung cancer. Staging. * Tracking Code: BO * EXAM: CT ABDOMEN AND PELVIS WITH CONTRAST TECHNIQUE: Multidetector CT imaging of the abdomen and pelvis was performed using the standard protocol following bolus administration of intravenous contrast. RADIATION DOSE REDUCTION: This exam was performed according to the departmental dose-optimization program which includes automated exposure control, adjustment of the mA and/or kV according to patient size and/or use of iterative reconstruction technique. CONTRAST:  75mL OMNIPAQUE IOHEXOL 350 MG/ML SOLN COMPARISON:  Chest CTA 02/23/2023 FINDINGS: Lower  chest: Bibasilar collapse/consolidation with bilateral pleural effusions, right greater than left. Pleural effusions are progressive since the chest CT 4 days ago. Hepatobiliary: Small area of low attenuation in the anterior liver, adjacent to the falciform ligament, is in a characteristic location for focal fatty deposition. 9 mm hypodensity in the left liver (15/3) is too small to characterize but statistically is likely benign. There is no evidence for gallstones, gallbladder wall thickening, or pericholecystic fluid. No intrahepatic or extrahepatic biliary dilation. Pancreas: No focal mass lesion. No dilatation of the main  duct. No intraparenchymal cyst. No peripancreatic edema. Spleen: No splenomegaly. No suspicious focal mass lesion. Adrenals/Urinary Tract: No adrenal nodule or mass. Kidneys unremarkable. No evidence for hydroureter. The urinary bladder appears normal for the degree of distention. Stomach/Bowel: Stomach is distended with food and fluid. Duodenum is normally positioned as is the ligament of Treitz. No small bowel wall thickening. No small bowel dilatation. The terminal ileum is normal. The appendix is normal. No gross colonic mass. No colonic wall thickening. Vascular/Lymphatic: Moderate atherosclerotic calcification is noted in the wall of the aorta. There is no gastrohepatic or hepatoduodenal ligament lymphadenopathy. No retroperitoneal or mesenteric lymphadenopathy. No pelvic sidewall lymphadenopathy. Reproductive: Unremarkable. Other: No intraperitoneal free fluid. Musculoskeletal: No worrisome lytic or sclerotic osseous abnormality. IMPRESSION: 1. No definite evidence for metastatic disease in the abdomen or pelvis. 2. 9 mm hypodensity in the left liver is too small to characterize but statistically is likely benign. Attention on follow-up recommended. 3. Bibasilar collapse/consolidation with bilateral pleural effusions, right greater than left. Pleural effusions are progressive since the  chest CT 4 days ago. 4.  Aortic Atherosclerosis (ICD10-I70.0). Electronically Signed   By: Kennith Center M.D.   On: 02/27/2023 11:47   [place] CORE BIOPSY (SOFT TISSUE)  Result Date: 02/26/2023 INDICATION: Cervical and supraclavicular adenopathy.  No diagnosis. EXAM: ULTRASOUND-GUIDED RIGHT SUPRACLAVICULAR NODAL MASS BIOPSY COMPARISON:  CT neck and chest, 09/24/2022. MEDICATIONS: None ANESTHESIA/SEDATION: Local anesthetic was administered. COMPLICATIONS: None immediate. TECHNIQUE: Informed written consent was obtained from the patient and/or patient's representative after a discussion of the risks, benefits and alternatives to treatment. Questions regarding the procedure were encouraged and answered. Initial ultrasound scanning demonstrated RIGHT supraclavicular nodal mass. An ultrasound image was saved for documentation purposes. The procedure was planned. A timeout was performed prior to the initiation of the procedure. The operative was prepped and draped in the usual sterile fashion, and a sterile drape was applied covering the operative field. A timeout was performed prior to the initiation of the procedure. Local anesthesia was provided with 1% lidocaine with epinephrine. Under direct ultrasound guidance, an 18 gauge core needle device was utilized to obtain to obtain 3 core needle biopsies of the RIGHT supraclavicular nodal mass. The samples were placed in saline and submitted to pathology. The needle was removed and superficial hemostasis was achieved with manual compression. Post procedure scan was negative for significant hematoma. A dressing was applied. The patient tolerated the procedure well without immediate postprocedural complication. IMPRESSION: Successful core biopsy of RIGHT supraclavicular nodal mass. Roanna Banning, MD [facility] Electronically Signed   By: Roanna Banning M.D.   On: 02/26/2023 10:15   ECHOCARDIOGRAM COMPLETE  Result  Date: 02/24/2023    ECHOCARDIOGRAM REPORT   Patient Name:   ZYLPHA SCHNELLE Date of Exam: 02/24/2023 Medical Rec #:  [id number]         Height:       68.0 in Accession #:    [id number]        Weight:       157.0 lb Date of Birth:  1970/10/26        BSA:          1.844 m Patient Age:    51 years          BP:           111/73 mmHg Patient Gender: F                 HR:  88 bpm. Exam Location:  Inpatient Procedure: 2D Echo, Cardiac Doppler and Color Doppler Indications:    A-FIB  History:        Patient has no prior history of Echocardiogram examinations.                 Arrythmias:Atrial Fibrillation; Risk Factors:Current Smoker.  Sonographer:    Darlys Gales Referring Phys: [phone] JACOB J STINSON IMPRESSIONS  1. Left ventricular ejection fraction, by estimation, is 60 to 65%. The left ventricle has normal function. The left ventricle has no regional wall motion abnormalities. Left ventricular diastolic parameters are indeterminate.  2. Right ventricular systolic function is normal. The right ventricular size is normal. Tricuspid regurgitation signal is inadequate for assessing PA pressure.  3. The mitral valve is normal in structure. No evidence of mitral valve regurgitation.  4. The aortic valve is grossly normal. Aortic valve regurgitation is not visualized.  5. The inferior vena cava is dilated in size with <50% respiratory variability, suggesting right atrial pressure of 15 mmHg. FINDINGS  Left Ventricle: Left ventricular ejection fraction, by estimation, is 60 to 65%. The left ventricle has normal function. The left ventricle has no regional wall motion abnormalities. The left ventricular internal cavity size was normal in size. There is  no left ventricular hypertrophy. Left ventricular diastolic parameters are indeterminate. Right Ventricle: The right ventricular size is normal. Right ventricular systolic function is normal. Tricuspid regurgitation signal is inadequate for assessing PA pressure. Left  Atrium: Left atrial size was normal in size. Right Atrium: Right atrial size was normal in size. Pericardium: There is no evidence of pericardial effusion. Mitral Valve: The mitral valve is normal in structure. No evidence of mitral valve regurgitation. Tricuspid Valve: Tricuspid valve regurgitation is not demonstrated. Aortic Valve: The aortic valve is grossly normal. Aortic valve regurgitation is not visualized. Aortic valve mean gradient measures 5.0 mmHg. Aortic valve peak gradient measures 7.6 mmHg. Aortic valve area, by VTI measures 2.82 cm. Pulmonic Valve: Pulmonic valve regurgitation is not visualized. Aorta: The aortic root and ascending aorta are structurally normal, with no evidence of dilitation. Venous: The inferior vena cava is dilated in size with less than 50% respiratory variability, suggesting right atrial pressure of 15 mmHg. IAS/Shunts: The interatrial septum was not well visualized.  LEFT VENTRICLE PLAX 2D LVIDd:         5.10 cm   Diastology LVIDs:         3.00 cm   LV e' medial:    7.07 cm/s LV PW:         0.80 cm   LV E/e' medial:  12.3 LV IVS:        0.70 cm   LV e' lateral:   10.20 cm/s LVOT diam:     1.80 cm   LV E/e' lateral: 8.5 LV SV:         76 LV SV Index:   41 LVOT Area:     2.54 cm  RIGHT VENTRICLE             IVC RV S prime:     15.10 cm/s  IVC diam: 2.50 cm TAPSE (M-mode): 2.5 cm LEFT ATRIUM             Index        RIGHT ATRIUM           Index LA Vol (A2C):   21.7 ml 11.77 ml/m  RA Area:     14.00 cm LA Vol (A4C):  27.3 ml 14.81 ml/m  RA Volume:   34.60 ml  18.76 ml/m LA Biplane Vol: 24.7 ml 13.40 ml/m  AORTIC VALVE AV Area (Vmax):    2.42 cm AV Area (Vmean):   2.26 cm AV Area (VTI):     2.82 cm AV Vmax:           138.00 cm/s AV Vmean:          102.000 cm/s AV VTI:            0.269 m AV Peak Grad:      7.6 mmHg AV Mean Grad:      5.0 mmHg LVOT Vmax:         131.00 cm/s LVOT Vmean:        90.500 cm/s LVOT VTI:          0.298 m LVOT/AV VTI ratio: 1.11  AORTA Ao Root diam:  3.20 cm Ao Asc diam:  3.50 cm MITRAL VALVE MV Area (PHT): 5.23 cm     SHUNTS MV Decel Time: 145 msec     Systemic VTI:  0.30 m MV E velocity: 87.00 cm/s   Systemic Diam: 1.80 cm MV A velocity: 102.00 cm/s MV E/A ratio:  0.85 Photographer signed by Carolan Clines Signature Date/Time: 02/24/2023/3:44:27 PM    Final    CT Angio Chest PE W and/or Wo Contrast  Result Date: 02/23/2023 CLINICAL DATA:  Shortness of breath, right neck mass, chest mass, headache * Tracking Code: BO * EXAM: CT ANGIOGRAPHY CHEST WITH CONTRAST TECHNIQUE: Multidetector CT imaging of the chest was performed using the standard protocol during bolus administration of intravenous contrast. Multiplanar CT image reconstructions and MIPs were obtained to evaluate the vascular anatomy. RADIATION DOSE REDUCTION: This exam was performed according to the departmental dose-optimization program which includes automated exposure control, adjustment of the mA and/or kV according to patient size and/or use of iterative reconstruction technique. CONTRAST:  75mL OMNIPAQUE IOHEXOL 350 MG/ML SOLN COMPARISON:  None Available. FINDINGS: Cardiovascular: No filling defect is identified in the pulmonary arterial tree to suggest pulmonary embolus. Mild atheromatous vascular calcification of the aortic arch. The large mediastinal mass bulges into the SVC posteriorly for example on image 128 series 4, probably from extrinsic compression, definite invasion is not observed. Because of lack of complete opacification from pulmonary arterial timing, patency of the left brachiocephalic vein is uncertain. Moderate pericardial effusion. Mediastinum/Nodes: Large right infrahilar and hilar mass extending into the mediastinum and tracking all the way to the thoracic inlet. This extends around the right mainstem bronchus, right upper lobe bronchus, and bronchus intermedius as well as the right lower lobe and right middle lobe bronchi, and likewise surrounds and narrows  the right pulmonary artery and its branches. In the subcarinal region, the conglomerate mass measures about 7.9 by 5.6 cm (image 144, series 4) and the mass flattens and narrows the SVC without overt occlusion. In the right infrahilar region, the observed mass measures 4.7 by 3.9 cm on image 179 series 4. Presumed lymph node anterior to the SVC in the upper chest measures 2.6 cm in short axis on image 36 series 3. A right lower neck level IV mass/lymph node measures 3.4 cm in short axis on image 10 series 3. Lungs/Pleura: The mass in the right chest displaces the upper trachea to the left. There is substantial narrowing of the right upper lobe bronchus, right middle lobe bronchus, and severe narrowing of the right lower lobe bronchus due to the surrounding mass.  Patchy regions of nodularity in the right lower lobe distal to the mass may represent postobstructive pneumonitis or satellite tumor nodules. Centrilobular emphysema. Scarring or atelectasis in the right middle lobe and right upper lobe anteriorly. Mild dependent atelectasis in both lower lobes. Trace right pleural effusion, image 50 series 3. Upper Abdomen: Small nonspecific hypodense lesion in the left hepatic lobe 0.9 cm in long axis on image 96 series 3. Musculoskeletal: Unremarkable Review of the MIP images confirms the above findings. IMPRESSION: 1. Large right infrahilar and hilar mass extending into the mediastinum and tracking all the way to the thoracic inlet. This extends around the right mainstem bronchus, right upper lobe bronchus, and bronchus intermedius as well as the right lower lobe and right middle lobe bronchi, and likewise surrounds and narrows the right pulmonary artery and its branches. The mass flattens and narrows the SVC without overt occlusion, and is confluent with bulky right level IV neck adenopathy. Top differential diagnostic considerations include lung cancer (such as squamous cell or small cell) versus extensive thoracic and  right lower neck lymphoma. 2. Patchy regions of nodularity in the right lower lobe distal to the mass may represent postobstructive pneumonitis or satellite tumor nodules. 3. Presumed lymph node anterior to the SVC in the upper chest measures 2.6 cm in short axis. 4. A right lower neck level IV mass/lymph node measures 3.4 cm in short axis, compatible with malignancy. 5. Moderate pericardial effusion. 6. Trace right pleural effusion. 7. Centrilobular emphysema. 8. Small nonspecific hypodense lesion in the left hepatic lobe 0.9 cm in long axis. 9. No pulmonary embolus is identified. 10. Aortic atherosclerosis. Aortic Atherosclerosis (ICD10-I70.0) and Emphysema (ICD10-J43.9). Electronically Signed   By: Gaylyn Rong M.D.   On: 02/23/2023 13:39   CT Soft Tissue Neck W Contrast  Result Date: 02/23/2023 CLINICAL DATA:  Neck mass, nonpulsatile right sided neck mass EXAM: CT NECK WITH CONTRAST TECHNIQUE: Multidetector CT imaging of the neck was performed using the standard protocol following the bolus administration of intravenous contrast. RADIATION DOSE REDUCTION: This exam was performed according to the departmental dose-optimization program which includes automated exposure control, adjustment of the mA and/or kV according to patient size and/or use of iterative reconstruction technique. CONTRAST:  75mL OMNIPAQUE IOHEXOL 350 MG/ML SOLN COMPARISON:  None Available. FINDINGS: Pharynx and larynx: Normal. No mass or swelling. Salivary glands: No inflammation, mass, or stone. Thyroid: Normal. Lymph nodes: There is bulky lower cervical and mediastinal lymphadenopathy involving the right level 3 and level 4 lymph node stations, the right supraclavicular region, and the anterior and posterior mediastinum. Cervical lymphadenopathy significant mass effect on the right internal jugular vein (series 5, image 68). Vascular: Negative. Mass effect on the right internal jugular vein, as above Limited intracranial: Negative.  Visualized orbits: Negative. Mastoids and visualized paranasal sinuses: Moderate left-sided mastoid effusion. There is pneumatization of the petrous apices with air-fluid levels. Paranasal sinuses are clear. Orbits are unremarkable. Skeleton: No acute or aggressive process. Upper chest: Mild paraseptal emphysema. See same day CT chest for additional findings Other: None. IMPRESSION: 1. Bulky right lower cervical and mediastinal lymphadenopathy, concerning for metastatic disease or lymphoma. 2. See separate CT chest for additional findings. Emphysema (ICD10-J43.9). Electronically Signed   By: Lorenza Cambridge M.D.   On: 02/23/2023 13:17   CT Head Wo Contrast  Result Date: 02/23/2023 CLINICAL DATA:  Headache EXAM: CT HEAD WITHOUT CONTRAST TECHNIQUE: Contiguous axial images were obtained from the base of the skull through the vertex without intravenous contrast. RADIATION DOSE  REDUCTION: This exam was performed according to the departmental dose-optimization program which includes automated exposure control, adjustment of the mA and/or kV according to patient size and/or use of iterative reconstruction technique. COMPARISON:  None Available. FINDINGS: Brain: There is no acute intracranial hemorrhage, extra-axial fluid collection, or acute infarct Parenchymal volume is normal. The ventricles are normal in size. Gray-white differentiation is preserved The pituitary and suprasellar region are normal. There is no mass lesion. There is no mass effect or midline shift. Vascular: No hyperdense vessel or unexpected calcification. Skull: Normal. Negative for fracture or focal lesion. Sinuses/Orbits: The imaged paranasal sinuses are clear. The globes and orbits are unremarkable. Other: There are small bilateral mastoid effusions. IMPRESSION: 1. No acute intracranial pathology. 2. Small bilateral mastoid effusions. Electronically Signed   By: Lesia Hausen M.D.   On: 02/23/2023 13:15   DG Chest Port 1 View  Result Date:  02/23/2023 CLINICAL DATA:  Sepsis EXAM: PORTABLE CHEST 1 VIEW COMPARISON:  X-ray 01/09/2023 FINDINGS: No pneumothorax, effusion or edema. Calcified aorta. Overlapping cardiac leads. There is new widening of the mediastinum compared to prior x-ray and fullness of the right lung hilum. Recommend further evaluation with CT to delineate for potential mass. IMPRESSION: New widening of the mediastinum with masslike fullness in the right lung hilum. Recommend follow up contrast CT to further delineate Electronically Signed   By: Karen Kays M.D.   On: 02/23/2023 13:11

## 2023-03-11 ENCOUNTER — Inpatient Hospital Stay (HOSPITAL_COMMUNITY): Payer: Medicaid Other

## 2023-03-11 DIAGNOSIS — J9601 Acute respiratory failure with hypoxia: Secondary | ICD-10-CM | POA: Diagnosis not present

## 2023-03-11 LAB — CBC WITH DIFFERENTIAL/PLATELET
Abs Immature Granulocytes: 0.07 10*3/uL (ref 0.00–0.07)
Basophils Absolute: 0 10*3/uL (ref 0.0–0.1)
Basophils Relative: 0 %
Eosinophils Absolute: 0 10*3/uL (ref 0.0–0.5)
Eosinophils Relative: 0 %
HCT: 22.2 % — ABNORMAL LOW (ref 36.0–46.0)
Hemoglobin: 7.4 g/dL — ABNORMAL LOW (ref 12.0–15.0)
Immature Granulocytes: 4 %
Lymphocytes Relative: 43 %
Lymphs Abs: 0.8 10*3/uL (ref 0.7–4.0)
MCH: 32.3 pg (ref 26.0–34.0)
MCHC: 33.3 g/dL (ref 30.0–36.0)
MCV: 96.9 fL (ref 80.0–100.0)
Monocytes Absolute: 0 10*3/uL — ABNORMAL LOW (ref 0.1–1.0)
Monocytes Relative: 2 %
Neutro Abs: 0.9 10*3/uL — ABNORMAL LOW (ref 1.7–7.7)
Neutrophils Relative %: 51 %
Platelets: 20 10*3/uL — CL (ref 150–400)
RBC: 2.29 MIL/uL — ABNORMAL LOW (ref 3.87–5.11)
RDW: 15 % (ref 11.5–15.5)
WBC: 1.8 10*3/uL — ABNORMAL LOW (ref 4.0–10.5)
nRBC: 0 % (ref 0.0–0.2)

## 2023-03-11 LAB — MRSA NEXT GEN BY PCR, NASAL: MRSA by PCR Next Gen: NOT DETECTED

## 2023-03-11 MED ORDER — MAGNESIUM HYDROXIDE 400 MG/5ML PO SUSP
30.0000 mL | Freq: Two times a day (BID) | ORAL | Status: AC
  Administered 2023-03-11 – 2023-03-14 (×3): 30 mL via ORAL
  Filled 2023-03-11 (×5): qty 30

## 2023-03-11 MED ORDER — HYDROMORPHONE 1 MG/ML IV SOLN
INTRAVENOUS | Status: DC
  Administered 2023-03-12: 1.99 mg via INTRAVENOUS

## 2023-03-11 MED ORDER — LORAZEPAM 2 MG/ML IJ SOLN
1.0000 mg | INTRAMUSCULAR | Status: DC | PRN
  Administered 2023-03-12 – 2023-03-14 (×5): 1 mg via INTRAVENOUS
  Filled 2023-03-11 (×5): qty 1

## 2023-03-11 MED ORDER — CALAMINE EX LOTN
1.0000 | TOPICAL_LOTION | Freq: Three times a day (TID) | CUTANEOUS | Status: DC | PRN
  Filled 2023-03-11: qty 177

## 2023-03-11 NOTE — Progress Notes (Signed)
PROGRESS NOTE  Geroge Baseman Curry  DOB: May 29, 1971  PCP: [facility] ZOX:[id number]  DOA: 03/06/2023  LOS: 5 days  Hospital Day: 6  Brief narrative: Ana Curry is a 52 y.o. female with PMH significant for lifelong smoking who was recently hospitalized 6/14 to 6/24 for worsening shortness of breath for 2 months.  CT chest at that time showed right infrahilar/hilar mass that extended around the right mainstem bronchus, right upper lobe and all the way to the thoracic inlet causing SVC syndrome.  She was seen by oncology. 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  CT abdomen pelvis and MRI brain did not show any metastatic disease. Bone scan showed subtle asymmetric uptake involving proximal left tibia. 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 6/21, started on chemotherapy with first cycle on 6/21.   6/21 to 6/24, underwent radiation treatment on 6/21 and 6/24. 6/22, Port-A-Cath was placed 6/24, discharged home on home oxygen to follow-up with oncology as an outpatient. Of note, during that hospital stay, patient also was noted to be in new onset A-fib with RVR.  She was started on oral Cardizem.  Anticoagulation was not restarted because of thrombocytopenia.  6/25, patient was seen at radiation clinic.  Noted to be ill-appearing, hypotensive, tachycardic, short of breath, had generalized weakness and hence sent to the ED.    In the ED, it was found that they had BP 97/68, pulse 101, RR 22, temp 98.2 F, SpO2 93% on 3 L O2 via [place].  Significant findings included WBC 4.7, hemoglobin 10.0, platelets 33,000, sodium 128, potassium 3.6, bicarb 26, BUN 17, creatinine 0.76, serum glucose 97. UA negative. CT chest/abdomen/pelvis obtained. No significant change in size of extensive confluent lymphadenopathy extending from right supraclavicular region through the mediastinum and right hilum. Stable extrinsic compression of the right internal  jugular vein, left brachiocephalic vein, and SVC s/p Port-A-Cath placement. No evidence of large vessel occlusion or intravascular thrombus. No acute findings or evidence of metastatic disease in the abdomen or pelvis. Mildly increased moderate right and small left pleural effusions noted with increased compressive atelectasis in the right middle lobe. Additionally increased patchy groundglass opacities throughout both lungs noted. Stable moderate pericardial effusion seen.   She was given IV hydration, IV pain meds, IV antiemetics Admitted to [facility] Oncology was consulted.  Subjective: Patient was seen and examined this morning. Looks sick and weak.  On PCA pump since yesterday.  States pain is some better.   Tachypneic, mild wheezing noted. On 8 L oxygen management, No family at bedside today She was moved to stepdown later this morning given her worsening respiratory status, need a PCA pump for pain control  Assessment and plan: Acute respiratory failure with hypoxia Extensive stage  Bilateral pleural effusion Recently discharged on home oxygen, readmitted with worsening shortness of breath 6/28, thoracentesis was done by IR, 1.1 L removed. Currently on supplemental oxygen at 8 L. CT chest with right lung small cell cancer with extension of right infrahilar, right mainstem bronchus, right upper lobe and thoracic inlet adenopathy Bone marrow biopsy as well showed metastasis.  Oncology following. Plan to continue radiation.  Immunotherapy on hold Continue DuoNeb   Cancer associated pain and anxiety Complains of pain throughout whole body today.  Currently on OxyContin 10 mg BID as well as Valium scheduled twice daily. 6/29, because of the severity of pain, patient was started on Dilaudid PCA.  Continue to monitor.  Thrombocytopenia Low platelets due to bone  marrow involvement.  No active bleeding. 1 unit platelet transfusion given on 6/27. Platelet level trending down.  20,000 today.   Per oncology recommendation, to transfuse if less than 10,000 or actively bleeding. Recent Labs  Lab 03/05/23 0822 03/06/23 1614 03/07/23 0525 03/08/23 0323 03/09/23 0341 03/10/23 0340 03/11/23 0250  PLT 33* 33* 31* 27* 40* 29* 20*    Moderate pericardial effusion: Stable pericardial effusion noted on CT.  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   stable findings on repeat CT imaging.  Has been undergoing radiation treatment.  Hyponatremia Likely due to poor intake. Recent Labs  Lab 03/05/23 0822 03/06/23 1614 03/07/23 0525 03/08/23 0323 03/09/23 0341  NA 133* 128* 130* 134* 132*     Paroxysmal atrial fibrillation: Developed A-fib RVR on recent admission.   Remains tachycardic, but gradually slowing down.  Continue amiodarone Not on anticoagulation due to thrombocytopenia.    Opiate related constipation Constipation more than 2 weeks Avoiding suppository or enema because of neutropenia Continue miralax, senna.  Added milk of magnesia yesterday.   Mobility: Encourage ambulation  Goals of care   Code Status: Full Code.  Palliative care consulted given the severity of pain.   DVT prophylaxis:  SCDs Start: 03/06/23 1947   Antimicrobials: None Fluid: NS at 125 mill per hour.  Given wheezing on auscultation, I would reduce it to 50 mill per hour Consultants: Oncology Family Communication: No family members at bedside today  Status: Patient Level of care:  Stepdown   Patient from: Home Anticipated d/c to: Pending clinical course Needs to continue in-hospital care:  Significant pain.  Requiring IV Dilaudid by PCA pump    Diet:  Diet Order             Diet regular Fluid consistency: Thin  Diet effective now                   Scheduled Meds:  amiodarone  200 mg Oral BID   Chlorhexidine Gluconate Cloth  6 each Topical Daily   diazepam  10 mg Oral BID   HYDROmorphone   Intravenous Q4H   lactulose  10 g Oral TID   nicotine  14 mg  Transdermal Daily   oxyCODONE  10 mg Oral Q12H   polyethylene glycol  17 g Oral BID   senna-docusate  1 tablet Oral BID   sodium chloride flush  3 mL Intravenous Q12H   Tbo-Filgrastim  300 mcg Subcutaneous q1800    PRN meds: acetaminophen **OR** acetaminophen, albuterol, bisacodyl, dextromethorphan-guaiFENesin, diphenhydrAMINE **OR** diphenhydrAMINE, ipratropium-albuterol, naloxone **AND** sodium chloride flush, ondansetron (ZOFRAN) IV, oxyCODONE   Infusions:   sodium chloride 125 mL/hr at 03/11/23 1259     Antimicrobials: Anti-infectives (From admission, onward)    None       Nutritional status:  Body mass index is 26.21 kg/m.          Objective: Vitals:   03/11/23 1144 03/11/23 1200  BP:    Pulse:    Resp: (!) 21   Temp:  98.3 F (36.8 C)  SpO2: 98%     Intake/Output Summary (Last 24 hours) at 03/11/2023 1518 Last data filed at 03/11/2023 0434 Gross per 24 hour  Intake 2330.37 ml  Output --  Net 2330.37 ml    Filed Weights   03/06/23 1320 03/06/23 2315 03/09/23 0500  Weight: 71.2 kg 77.1 kg 78.2 kg   Weight change:  Body mass index is 26.21 kg/m.   Physical Exam: General exam: Pleasant, middle-aged Caucasian  female.  Sick looking.  Has partially controlled pain and anxiety  skin: No rashes, lesions or ulcers. HEENT: Atraumatic, normocephalic, no obvious bleeding Lungs: Diminished entry in both bases. CVS: Persistent tachycardia, A-fib, no murmur GI/Abd soft, mild diffuse tenderness, distention present, bowel sound present CNS : Alert, awake, oriented x 3 psychiatry: Sad affect Extremities: No pedal edema, no calf tenderness  Data Review: I have personally reviewed the laboratory data and studies available.  F/u labs ordered Unresulted Labs (From admission, onward)     Start     Ordered   03/08/23 0500  CBC with Differential/Platelet  Daily,   R     Question:  Specimen collection method  Answer:  IV Team=IV Team collect   03/07/23 1007             Total time spent in review of labs and imaging, patient evaluation, formulation of plan, documentation and communication with family: 55 minutes  Signed, Lorin Glass, MD [facility] 03/11/2023

## 2023-03-12 ENCOUNTER — Inpatient Hospital Stay (HOSPITAL_COMMUNITY): Payer: Medicaid Other

## 2023-03-12 ENCOUNTER — Ambulatory Visit: Payer: Medicaid Other

## 2023-03-12 DIAGNOSIS — J9601 Acute respiratory failure with hypoxia: Secondary | ICD-10-CM | POA: Diagnosis not present

## 2023-03-12 LAB — CBC
HCT: 19.5 % — ABNORMAL LOW (ref 36.0–46.0)
Hemoglobin: 6.3 g/dL — CL (ref 12.0–15.0)
MCH: 31.5 pg (ref 26.0–34.0)
MCHC: 32.3 g/dL (ref 30.0–36.0)
MCV: 97.5 fL (ref 80.0–100.0)
Platelets: 13 10*3/uL — CL (ref 150–400)
RBC: 2 MIL/uL — ABNORMAL LOW (ref 3.87–5.11)
RDW: 15 % (ref 11.5–15.5)
WBC: 1.3 10*3/uL — CL (ref 4.0–10.5)
nRBC: 2.4 % — ABNORMAL HIGH (ref 0.0–0.2)

## 2023-03-12 LAB — CBC WITH DIFFERENTIAL/PLATELET
Abs Immature Granulocytes: 0.08 10*3/uL — ABNORMAL HIGH (ref 0.00–0.07)
Basophils Absolute: 0 10*3/uL (ref 0.0–0.1)
Basophils Relative: 1 %
Eosinophils Absolute: 0 10*3/uL (ref 0.0–0.5)
Eosinophils Relative: 0 %
HCT: 21.2 % — ABNORMAL LOW (ref 36.0–46.0)
Hemoglobin: 7 g/dL — ABNORMAL LOW (ref 12.0–15.0)
Immature Granulocytes: 6 %
Lymphocytes Relative: 50 %
Lymphs Abs: 0.7 10*3/uL (ref 0.7–4.0)
MCH: 32.3 pg (ref 26.0–34.0)
MCHC: 33 g/dL (ref 30.0–36.0)
MCV: 97.7 fL (ref 80.0–100.0)
Monocytes Absolute: 0.1 10*3/uL (ref 0.1–1.0)
Monocytes Relative: 6 %
Neutro Abs: 0.5 10*3/uL — ABNORMAL LOW (ref 1.7–7.7)
Neutrophils Relative %: 37 %
Platelets: 15 10*3/uL — CL (ref 150–400)
RBC: 2.17 MIL/uL — ABNORMAL LOW (ref 3.87–5.11)
RDW: 15.1 % (ref 11.5–15.5)
WBC: 1.3 10*3/uL — CL (ref 4.0–10.5)
nRBC: 2.2 % — ABNORMAL HIGH (ref 0.0–0.2)

## 2023-03-12 LAB — PREPARE RBC (CROSSMATCH)

## 2023-03-12 LAB — BASIC METABOLIC PANEL
Anion gap: 10 (ref 5–15)
BUN: 21 mg/dL — ABNORMAL HIGH (ref 6–20)
CO2: 25 mmol/L (ref 22–32)
Calcium: 8.5 mg/dL — ABNORMAL LOW (ref 8.9–10.3)
Chloride: 97 mmol/L — ABNORMAL LOW (ref 98–111)
Creatinine, Ser: 0.73 mg/dL (ref 0.44–1.00)
GFR, Estimated: >60 mL/min (ref >60)
Glucose, Bld: 159 mg/dL — ABNORMAL HIGH (ref 70–99)
Potassium: 3.8 mmol/L (ref 3.5–5.1)
Sodium: 132 mmol/L — ABNORMAL LOW (ref 135–145)

## 2023-03-12 LAB — BLOOD GAS, ARTERIAL
Acid-Base Excess: 1.2 mmol/L (ref 0.0–2.0)
Bicarbonate: 26 mmol/L (ref 20.0–28.0)
Drawn by: 20012
O2 Content: 12 L/min
O2 Saturation: 100 %
Patient temperature: 36.8
pCO2 arterial: 41 mmHg (ref 32–48)
pH, Arterial: 7.41 (ref 7.35–7.45)
pO2, Arterial: 110 mmHg — ABNORMAL HIGH (ref 83–108)

## 2023-03-12 LAB — LACTIC ACID, PLASMA: Lactic Acid, Venous: 1.5 mmol/L (ref 0.5–1.9)

## 2023-03-12 LAB — PHOSPHORUS: Phosphorus: 3.6 mg/dL (ref 2.5–4.6)

## 2023-03-12 LAB — BPAM RBC: Unit Type and Rh: 6200

## 2023-03-12 LAB — MAGNESIUM: Magnesium: 2.4 mg/dL (ref 1.7–2.4)

## 2023-03-12 MED ORDER — METHYLNALTREXONE BROMIDE 12 MG/0.6ML ~~LOC~~ SOLN
12.0000 mg | Freq: Once | SUBCUTANEOUS | Status: AC
  Administered 2023-03-12: 12 mg via SUBCUTANEOUS
  Filled 2023-03-12: qty 0.6

## 2023-03-12 MED ORDER — SODIUM CHLORIDE 0.9% IV SOLUTION
Freq: Once | INTRAVENOUS | Status: AC
  Administered 2023-03-12: 10 mL/h via INTRAVENOUS

## 2023-03-12 MED ORDER — HYDROMORPHONE 1 MG/ML IV SOLN
INTRAVENOUS | Status: DC
  Administered 2023-03-12: 1.18 mg via INTRAVENOUS
  Administered 2023-03-12: 0.876 mg via INTRAVENOUS
  Administered 2023-03-12: 1.57 mg via INTRAVENOUS
  Administered 2023-03-13: 1.14 mg via INTRAVENOUS
  Administered 2023-03-13: 1.2 mg via INTRAVENOUS

## 2023-03-12 MED ORDER — IPRATROPIUM-ALBUTEROL 0.5-2.5 (3) MG/3ML IN SOLN
3.0000 mL | Freq: Four times a day (QID) | RESPIRATORY_TRACT | Status: DC
  Administered 2023-03-12 – 2023-03-15 (×12): 3 mL via RESPIRATORY_TRACT
  Filled 2023-03-12 (×12): qty 3

## 2023-03-12 MED ORDER — ALBUTEROL SULFATE (2.5 MG/3ML) 0.083% IN NEBU
2.5000 mg | INHALATION_SOLUTION | RESPIRATORY_TRACT | Status: DC | PRN
  Administered 2023-03-14: 2.5 mg via RESPIRATORY_TRACT
  Filled 2023-03-12: qty 3

## 2023-03-12 NOTE — Progress Notes (Signed)
PT Cancellation Note  Patient Details Name: Ana Curry MRN: [id number] DOB: 04-Jun-1971   Cancelled Treatment:    Reason Eval/Treat Not Completed: Medical issues which prohibited therapy Respiratory status. Blanchard Kelch PT Acute Rehabilitation Services Office [phone] Weekend pager-[phone]   Rada Hay 03/12/2023, 4:10 PM

## 2023-03-12 NOTE — Consult Note (Signed)
Consultation Note Date: 03/12/2023   Patient Name: Ana Curry  DOB: [birth year]  MRN: [id number]  Age / Sex: 52 y.o., female  PCP: [facility] Referring Physician: Lorin Glass, MD  Reason for Consultation: pain management and non pain symptom management as well as establishing goals of care  HPI/Patient Profile: 52 y.o. female   admitted on 03/06/2023   Ana Curry is a 52 y.o. female with PMH significant for lifelong smoking who was recently hospitalized 6/14 to 6/24 for worsening shortness of breath for 2 months.  CT chest at that time showed right infrahilar/hilar mass that extended around the right mainstem bronchus, right upper lobe and all the way to the thoracic inlet causing SVC syndrome.  She was seen by oncology. 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  CT abdomen pelvis and MRI brain did not show any metastatic disease. Bone scan showed subtle asymmetric uptake involving proximal left tibia. 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 6/21, started on chemotherapy with first cycle on 6/21.   6/21 to 6/24, underwent radiation treatment on 6/21 and 6/24. 6/22, Port-A-Cath was placed 6/24, discharged home on home oxygen to follow-up with oncology as an outpatient. Of note, during that hospital stay, patient also was noted to be in new onset A-fib with RVR.  She was started on oral Cardizem.  Anticoagulation was not restarted because of thrombocytopenia.   6/25, patient was seen at radiation clinic.  Noted to be ill-appearing, hypotensive, tachycardic, short of breath, had generalized weakness and hence sent to the ED.     Clinical Assessment and Goals of Care: Patient has since been admitted to hospital medicine service for cancer associated pain and anxiety, extensive stage small cell lung cancer with acute hypoxic respiratory failure  with ET scan showing right lung small cell cancer with extension of right infrahilar right mainstem bronchus right upper lobe and thoracic inlet adenopathy.  Bone marrow biopsy also with bone marrow involvement.  Patient undergoing radiation treatment for SVC syndrome.  Course also complicated by opioid related constipation.  On bowel regimen, also to have methyl naltrexone.  Palliative consult for symptom management and additional support has been requested. Patient seen and examined.  Chart reviewed.  Discussed with the patient in detail. Palliative medicine is specialized medical care for people living with serious illness. It focuses on providing relief from the symptoms and stress of a serious illness. The goal is to improve quality of life for both the patient and the family. Goals of care: Broad aims of medical therapy in relation to the patient's values and preferences. Our aim is to provide medical care aimed at enabling patients to achieve the goals that matter most to them, given the circumstances of their particular medical situation and their constraints.   Patient is resting in bed.  She has some cough.  At times she does appear to have mild stridor as well.  Advised the patient to sit upright.  Otherwise, she does not complain of chest  pain or dysphagia.  Her voice is not hoarse.  Advised her that laying supine could make her symptoms worse.  She denies any headache syncope or visual disturbances.  She does have some facial edema but no cyanosis.  Her neck veins do not appear distended to me.  She does not have any upper extremity swelling.  Pain management options discussed.  Other non pain symptom management also discussed.  Patient is most concerned about her low platelets.  Patient is most concerned about her overall condition.  Offered active listening, supportive empathic presence and other therapeutic techniques were employed at the time of this initial consultation.  See  recommendations below.  PMT to follow.  NEXT OF KIN  Father Ana Curry  SUMMARY OF RECOMMENDATIONS    Full Code, continue with radiation Upright position, O2 [place] and other supportive therapies Continue current Dilaudid PCA settings: basal rate 0.3 mg/hour, bolus rate 0.3 mg Q 8 min PRN. Will discontinue PO long acting opioid OxyContin as well as PO Oxy IR PRN since the patient's pain is well controlled on current PCA settings. She wishes to remain on PCA for now.  Spiritual care consult for advance care planning documents to be completed-patient wishes to enact her her father [name] barrier as her designated healthcare power of attorney agent. Rest as per hospital medicine and medical oncology recommend palliative support in the outpatient setting as well has active [facility].  Literature review: Steroids, radiotherapy, chemotherapy and stents for superior vena caval obstruction in carcinoma of the bronchus: a systematic review. AU Rowell NP, Vanessa Ralphs [facility]  SO Clin Oncol (R Coll Radiol). 2002;14(5):338.  REVIEWERS' CONCLUSIONS Chemotherapy and radiotherapy are effective in relieving SVCO in a proportion of patients whilst stent insertion may provide relief in a higher proportion and more rapidly.  The effectiveness of steroids and the optimal timing of stent insertion (whether at diagnosis or following failure of other modalities) remain uncertain. In [facility] chemotherapy and/or radiotherapy relieved SVCO in 77%.  Shared the above with the patient to the best of my ability.  Code Status/Advance Care Planning: Full code   Symptom Management:     Palliative Prophylaxis:  Delirium Protocol  Additional Recommendations (Limitations, Scope, Preferences): Full Scope Treatment  Psycho-social/Spiritual:  Desire for further Chaplaincy support:yes Additional Recommendations: Caregiving  Support/Resources  Prognosis:  Unable to determine  Discharge Planning: To Be Determined      Primary  Diagnoses: Present on Admission:  Acute respiratory failure with hypoxia (HCC)  Small cell carcinoma of lung, right (HCC)  SVC syndrome  Thrombocytopenia (HCC)   I have reviewed the medical record, interviewed the patient and family, and examined the patient. The following aspects are pertinent.  Past Medical History:  Diagnosis Date   Hidradenitis suppurativa    History of abnormal cervical Pap smear    approx 2003  s/p LEEP   VIN III (vulvar intraepithelial neoplasia III)    Wears glasses    Social History   Socioeconomic History   Marital status: Single    Spouse name: Not on file   Number of children: 0   Years of education: Not on file   Highest education level: High school graduate  Occupational History   Not on file  Tobacco Use   Smoking status: Every Day    Packs/day: 1.5    Types: Cigarettes    Start date: 35   Smokeless tobacco: Never  Vaping Use   Vaping Use: Never used  Substance and Sexual Activity  Alcohol use: Not Currently   Drug use: Never   Sexual activity: Not Currently    Birth control/protection: None  Other Topics Concern   Not on file  Social History Narrative   Not on file   Social Determinants of Health   Financial Resource Strain: Not on file  Food Insecurity: No Food Insecurity (03/06/2023)   Hunger Vital Sign    Worried About Running Out of Food in the Last Year: Never true    Ran Out of Food in the Last Year: Never true  Transportation Needs: No Transportation Needs (03/06/2023)   PRAPARE - Administrator, Civil Service (Medical): No    Lack of Transportation (Non-Medical): No  Physical Activity: Not on file  Stress: Not on file  Social Connections: Not on file   Family History  Problem Relation Age of Onset   Non-Hodgkin's lymphoma Mother 72   Colon cancer Neg Hx    Breast cancer Neg Hx    Ovarian cancer Neg Hx    Endometrial cancer Neg Hx    Pancreatic cancer Neg Hx    Prostate cancer Neg Hx     Scheduled Meds:  amiodarone  200 mg Oral BID   Chlorhexidine Gluconate Cloth  6 each Topical Daily   diazepam  10 mg Oral BID   HYDROmorphone   Intravenous Q4H   ipratropium-albuterol  3 mL Nebulization Q6H   lactulose  10 g Oral TID   magnesium hydroxide  30 mL Oral BID   methylnaltrexone  12 mg Subcutaneous Once   nicotine  14 mg Transdermal Daily   polyethylene glycol  17 g Oral BID   senna-docusate  1 tablet Oral BID   sodium chloride flush  3 mL Intravenous Q12H   Tbo-Filgrastim  300 mcg Subcutaneous q1800   Continuous Infusions:  sodium chloride 100 mL/hr at 03/12/23 1034   PRN Meds:.acetaminophen **OR** acetaminophen, bisacodyl, calamine, dextromethorphan-guaiFENesin, diphenhydrAMINE **OR** diphenhydrAMINE, LORazepam, naloxone **AND** sodium chloride flush, ondansetron (ZOFRAN) IV Medications Prior to Admission:  Prior to Admission medications   Medication Sig Start Date End Date Taking? Authorizing Provider  acetaminophen (TYLENOL) 500 MG tablet Take 1,000 mg by mouth every 6 (six) hours as needed.   Yes [provider]  amiodarone (PACERONE) 200 MG tablet Take 1 tablet (200 mg total) by mouth daily. 03/06/23  Yes Amin, Loura Halt, MD  clotrimazole-betamethasone (LOTRISONE) cream Apply 1 Application topically daily. To feet. 02/23/23  Yes [provider]  dextromethorphan-guaiFENesin (MUCINEX DM) 30-600 MG 12hr tablet Take 1 tablet by mouth 2 (two) times daily as needed for cough.   Yes [provider]  diazepam (VALIUM) 10 MG tablet Take 10 mg by mouth daily as needed for anxiety. 02/23/23  Yes [provider]  dicyclomine (BENTYL) 20 MG tablet Take 20 mg by mouth daily.   Yes [provider]  ipratropium-albuterol (DUONEB) 0.5-2.5 (3) MG/3ML SOLN Take 3 mLs by nebulization every 4 (four) hours as needed. 03/05/23  Yes Amin, Ankit Chirag, MD  nicotine (NICODERM CQ - DOSED IN MG/24 HOURS) 21 mg/24hr patch Place 1 patch (21 mg total)  onto the skin daily. 03/06/23  Yes Amin, Ankit Chirag, MD  polyethylene glycol (MIRALAX / GLYCOLAX) 17 g packet Take 17 g by mouth daily. 03/06/23  Yes Amin, Ankit Chirag, MD  triamcinolone ointment (KENALOG) 0.1 % Apply 1 Application topically daily as needed (Ear rash). 08/01/22 08/01/23 Yes [provider]  VENTOLIN HFA 108 (90 Base) MCG/ACT inhaler Inhale 2 puffs  into the lungs every 6 (six) hours as needed for wheezing or shortness of breath. 03/05/23  Yes Amin, Ankit Chirag, MD  senna-docusate (SENOKOT-S) 8.6-50 MG tablet Take 1 tablet by mouth at bedtime as needed for mild constipation. Patient not taking: Reported on 03/07/2023 03/05/23   Dimple Nanas, MD   Allergies  Allergen Reactions   Codeine Nausea And Vomiting   Review of Systems +anxiety + cough  Physical Exam Awake alert Decreased breath sounds in bases Monitor noted Mild generalized tenderness abdomen Flat affect at times Some facial edema, no cyanosis, no neck vein distension  Vital Signs: BP 100/65 (BP Location: Left Wrist)   Pulse (!) 114   Temp 98.2 F (36.8 C) (Oral)   Resp 17   Ht 5\' 8"  (1.727 m)   Wt 78.2 kg   LMP 01/22/2017   SpO2 97%   BMI 26.21 kg/m  Pain Scale: 0-10 POSS *See Group Information*: S-Acceptable,Sleep, easy to arouse Pain Score: 4    SpO2: SpO2: 97 % O2 Device:SpO2: 97 % O2 Flow Rate: .O2 Flow Rate (L/min): 10 L/min  IO: Intake/output summary:  Intake/Output Summary (Last 24 hours) at 03/12/2023 1441 Last data filed at 03/12/2023 1035 Gross per 24 hour  Intake 2854.52 ml  Output 200 ml  Net 2654.52 ml    LBM: Last BM Date : 02/28/23 Baseline Weight: Weight: 71.2 kg Most recent weight: Weight: 78.2 kg     Palliative Assessment/Data:   Palliative performance scale 50%.  Time In: 1330 Time Out: 1430 time Total:  60 Greater than 50%  of this time was spent counseling and coordinating care related to the above assessment and plan.  Signed by: Rosalin Hawking, MD    Please contact Palliative Medicine Team phone at [phone] for questions and concerns.  For individual provider: See Loretha Stapler

## 2023-03-12 NOTE — Plan of Care (Signed)
Discussed with patient plan of care for the evening, pain management and critical labs with some teach back displayed.  What is important to the patient is breathing and rest tonight.  She did have a milkshake the family brought which made her smile.  Her father and sister visited and she wanted me to update them.  Discussed palliative care with sister more in depth.  The father looked under ready to absorb any heavy information at this time.    She did have a pretty size bowel movement at the beginning of the shift with no evidence of blood.  She did get a bath as well.    Problem: Education: Goal: Knowledge of General Education information will improve Description: Including pain rating scale, medication(s)/side effects and non-pharmacologic comfort measures Outcome: Progressing   Problem: Health Behavior/Discharge Planning: Goal: Ability to manage health-related needs will improve Outcome: Progressing   Problem: Coping: Goal: Level of anxiety will decrease Outcome: Progressing   Problem: Elimination: Goal: Will not experience complications related to bowel motility Outcome: Progressing   Problem: Pain Managment: Goal: General experience of comfort will improve Outcome: Progressing

## 2023-03-12 NOTE — Progress Notes (Signed)
PCCM made aware they may have to be consulted tonight.  [facility] MD on-call informed of several critical lab values earlier in the shift, heart rate 115-150's and blood order obtained.  He may place her on Bipap.

## 2023-03-12 NOTE — Progress Notes (Addendum)
PROGRESS NOTE  Ana Curry  DOB: [birth year]  PCP: [facility] QMV:[id number]  DOA: 03/06/2023  LOS: 6 days  Hospital Day: 7  Brief narrative: Ana Curry is a 52 y.o. female with PMH significant for lifelong smoking who was recently hospitalized 6/14 to 6/24 for worsening shortness of breath for 2 months.  CT chest at that time showed right infrahilar/hilar mass that extended around the right mainstem bronchus, right upper lobe and all the way to the thoracic inlet causing SVC syndrome.  She was seen by oncology. 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  CT abdomen pelvis and MRI brain did not show any metastatic disease. Bone scan showed subtle asymmetric uptake involving proximal left tibia. 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 6/21, started on chemotherapy with first cycle on 6/21.   6/21 to 6/24, underwent radiation treatment on 6/21 and 6/24. 6/22, Port-A-Cath was placed 6/24, discharged home on home oxygen to follow-up with oncology as an outpatient. Of note, during that hospital stay, patient also was noted to be in new onset A-fib with RVR.  She was started on oral Cardizem.  Anticoagulation was not restarted because of thrombocytopenia.  6/25, patient was seen at radiation clinic.  Noted to be ill-appearing, hypotensive, tachycardic, short of breath, had generalized weakness and hence sent to the ED.    In the ED, it was found that they had BP 97/68, pulse 101, RR 22, temp 98.2 F, SpO2 93% on 3 L O2 via .  Significant findings included WBC 4.7, hemoglobin 10.0, platelets 33,000, sodium 128, potassium 3.6, bicarb 26, BUN 17, creatinine 0.76, serum glucose 97. UA negative. CT chest/abdomen/pelvis obtained. No significant change in size of extensive confluent lymphadenopathy extending from right supraclavicular region through the mediastinum and right hilum. Stable extrinsic compression of the right internal  jugular vein, left brachiocephalic vein, and SVC s/p Port-A-Cath placement. No evidence of large vessel occlusion or intravascular thrombus. No acute findings or evidence of metastatic disease in the abdomen or pelvis. Mildly increased moderate right and small left pleural effusions noted with increased compressive atelectasis in the right middle lobe. Additionally increased patchy groundglass opacities throughout both lungs noted. Stable moderate pericardial effusion seen.   She was given IV hydration, IV pain meds, IV antiemetics Admitted to [facility] Oncology was consulted.  Subjective: Patient was seen and examined this morning. Pain better controlled with PCA pump basal bolus.  Wheezing was less today. But on 10 L oxygen by nasal cannula.  Remains tachycardic in 100s. Patient actually wanted the basal rate of PCA pump to be reduced because she did not like how the pain meds make her feel. Remains anxious and overwhelmed with her health situation.    Assessment and plan: Acute respiratory failure with hypoxia Extensive stage  Bilateral pleural effusion Recently discharged on home oxygen, readmitted with worsening shortness of breath 6/28, thoracentesis was done by IR, 1.1 L removed. Currently on supplemental oxygen at 8 L. CT chest with right lung small cell cancer with extension of right infrahilar, right mainstem bronchus, right upper lobe and thoracic inlet adenopathy Bone marrow biopsy as well showed metastasis.  Oncology following. Plan to continue radiation.  Immunotherapy on hold Continue DuoNeb   Cancer associated pain and anxiety Complains of pain throughout whole body today.  Currently on OxyContin 10 mg BID as well as Valium scheduled twice daily. 6/29, because of the severity of pain, patient was started on Dilaudid PCA.  Currently on  basal 66 and bolus Dilaudid IV.  Palliative care consulted as well.  Continue to monitor.  Sinus tachycardia H/o paroxysmal A-fib Moderate  pericardial effusion Remains in sinus tach since admission.  Probably pain related.  CT chest on admission also showed moderate pericardial effusion.  Clinically not in tamponade.  And recent admission, patient developed A-fib with RVR and was started on amiodarone.  Continue amiodarone Not on anticoagulation due to thrombocytopenia.  Thrombocytopenia Low platelets due to bone marrow involvement.  No active bleeding. 1 unit platelet transfusion given on 6/27. Platelet level trending down.  15,000 today.  Per oncology recommendation, to transfuse if less than 10,000 or actively bleeding.  Continue monitor Recent Labs  Lab 03/06/23 1614 03/07/23 0525 03/08/23 0323 03/09/23 0341 03/10/23 0340 03/11/23 0250 03/12/23 0754  PLT 33* 31* 27* 40* 29* 20* 15*    Metastatic small cell lung cancer Oncology following.  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  stable findings on repeat CT imaging. Has been undergoing radiation treatment.  Hyponatremia Likely due to poor intake. Recent Labs  Lab 03/06/23 1614 03/07/23 0525 03/08/23 0323 03/09/23 0341  NA 128* 130* 134* 132*    Opiate related constipation Constipation more than 2 weeks Avoiding suppository or enema because of neutropenia Continue miralax, senna.  Added milk of magnesia yesterday.   Mobility: Encourage ambulation  Goals of care   Code Status: Full Code.  Palliative care consulted given the severity of pain.   DVT prophylaxis:  SCDs Start: 03/06/23 1947   Antimicrobials: None Fluid: NS at 50 mL/h  Consultants: Oncology Family Communication: No family members at bedside today  Status: Patient Level of care:  Stepdown   Patient from: Home Anticipated d/c to: Pending clinical course Needs to continue in-hospital care:  Significant pain.  Remains on IV Dilaudid by PCA pump    Diet:  Diet Order             Diet regular Fluid consistency: Thin  Diet effective now                    Scheduled Meds:  amiodarone  200 mg Oral BID   Chlorhexidine Gluconate Cloth  6 each Topical Daily   diazepam  10 mg Oral BID   HYDROmorphone   Intravenous Q4H   ipratropium-albuterol  3 mL Nebulization Q6H   lactulose  10 g Oral TID   magnesium hydroxide  30 mL Oral BID   nicotine  14 mg Transdermal Daily   oxyCODONE  10 mg Oral Q12H   polyethylene glycol  17 g Oral BID   senna-docusate  1 tablet Oral BID   sodium chloride flush  3 mL Intravenous Q12H   Tbo-Filgrastim  300 mcg Subcutaneous q1800    PRN meds: acetaminophen **OR** acetaminophen, bisacodyl, calamine, dextromethorphan-guaiFENesin, diphenhydrAMINE **OR** diphenhydrAMINE, LORazepam, naloxone **AND** sodium chloride flush, ondansetron (ZOFRAN) IV, oxyCODONE   Infusions:   sodium chloride 100 mL/hr at 03/12/23 1034     Antimicrobials: Anti-infectives (From admission, onward)    None       Nutritional status:  Body mass index is 26.21 kg/m.          Objective: Vitals:   03/12/23 1101 03/12/23 1200  BP:  100/65  Pulse:  (!) 114  Resp: 16 17  Temp:    SpO2: 100% 96%    Intake/Output Summary (Last 24 hours) at 03/12/2023 1220 Last data filed at 03/12/2023 1035 Gross per 24 hour  Intake 2854.52 ml  Output 200 ml  Net 2654.52 ml    Filed Weights   03/06/23 1320 03/06/23 2315 03/09/23 0500  Weight: 71.2 kg 77.1 kg 78.2 kg   Weight change:  Body mass index is 26.21 kg/m.   Physical Exam: General exam: Pleasant, middle-aged Caucasian female.  Pain control is somewhat better today.   Skin: No rashes, lesions or ulcers. HEENT: Atraumatic, normocephalic, no obvious bleeding Lungs: Diminished entry in both bases. CVS: Persistent tachycardia, A-fib, no murmur GI/Abd soft, mild diffuse tenderness, distention present, bowel sound present CNS : Alert, awake, oriented x 3 psychiatry: Sad affect Extremities: No pedal edema, no calf tenderness  Data Review: I have personally reviewed the  laboratory data and studies available.  F/u labs ordered Unresulted Labs (From admission, onward)     Start     Ordered   03/13/23 0500  Basic metabolic panel  Tomorrow morning,   R       Question:  Specimen collection method  Answer:  Unit=Unit collect   03/12/23 0733   03/08/23 0500  CBC with Differential/Platelet  Daily,   R     Question:  Specimen collection method  Answer:  IV Team=IV Team collect   03/07/23 1007            Total time spent in review of labs and imaging, patient evaluation, formulation of plan, documentation and communication with family: 55 minutes  Signed, Lorin Glass, MD [facility] 03/12/2023

## 2023-03-12 NOTE — Progress Notes (Signed)
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   Pain from procedure/lower back pain Could be an element of bone pain from cancer involvement of her bone marrow She will continue pain medications as needed We discussed risk of constipation with chronic pain medicine   Opiate induced constipation I suspect an element of her pain could be related to constipation This is seen on her imaging study Recommend laxative therapy I recommend avoid enema or suppository due to recent chemotherapy and anticipated pancytopenia in the future Trial of methylnatrexone   Respiratory failure due to lung cancer Continue oxygen therapy   Acquired pancytopenia Possible bone marrow involvement Anticipate her platelet count will drop over the next few days. Recommend G-CSF support daily until [facility] is greater than 1.5 Will check CBC daily and transfuse if signs of bleeding or platelet count less than 10,000   Low BP, hyponatremia, poor oral intake This has improved with IV fluids from prior hospitalization Recommend gentle IV fluid hydration with normal saline   Goals of care She will continue radiation therapy in the hospital I do not think it is realistic to expect her to be discharged home as the patient is not able to take care of herself even with family support and she is readmitted in less than 24 hours after discharge   Discharge planning Recommend consideration for skilled nursing facility as part of her discharge planning Due to progressive pancytopenia, I anticipate she will be here throughout the week. It is not  safe to be discharged until her platelet counts not trending up without transfusion support which I anticipate will be sometime this week  All questions were answered. The patient knows to call the clinic with any problems, questions or concerns.   The total time spent in the appointment was 40 minutes encounter with patients including review of chart and various tests results, discussions about plan of care and coordination of care plan  Artis Delay, MD 03/12/2023 12:36 PM  Subjective:  The patient was moved to stepdown unit due to increased work of breathing, tachycardia and worsening pancytopenia.  Due to pain, she was placed on PCA pump Her pain is better controlled but she has intermittent confusion episodes She denies recent bleeding Has been afebrile Thoracentesis also may help her breathing a little bit.  No bowel movement for almost 2 days.  Oral intake remain poor  Objective:  Vitals:   03/12/23 1101 03/12/23 1200  BP:  100/65  Pulse:  (!) 114  Resp: 16 17  Temp:    SpO2: 100% 96%     Intake/Output Summary (Last 24 hours) at 03/12/2023 1236 Last data filed at 03/12/2023 1035 Gross per 24 hour  Intake 2854.52 ml  Output 200 ml  Net 2654.52 ml    GENERAL:alert, no distress and comfortable.  Increased work of breathing with oxygen delivery via nasal cannula.  PCA pump noted NEURO: alert & oriented x 3 with fluent speech, no focal motor/sensory deficits   Labs:  Recent Labs    03/05/23 0822 03/06/23 1614 03/07/23 0525 03/08/23 0323 03/09/23 0341  NA 133* 128* 130* 134* 132*  K 3.6 3.6  3.6 3.7 3.8  CL 101 95* 95* 98 96*  CO2 24 26 25 27 25   GLUCOSE 113* 97 118* 109* 108*  BUN 15 17 19 16 15   CREATININE 0.83 0.76 0.91 0.93 0.82  CALCIUM 9.4 8.9 9.2 9.0 9.3  GFRNONAA >60 >60 >60 >60 >60  PROT 5.9* 5.8* 5.6*  --   --   ALBUMIN 2.8* 2.8* 2.6*  --   --   AST 39 44* 45*  --   --   ALT 26 24 24   --   --   ALKPHOS 86 84 80  --   --   BILITOT 0.5 0.9 0.7  --   --      Studies:  DG Abd Portable 1V  Result Date: 03/11/2023 CLINICAL DATA:  Constipation.  No bowel movement 10 days. EXAM: PORTABLE ABDOMEN - 1 VIEW COMPARISON:  CT 03/06/2023 FINDINGS: Bowel gas pattern is nonobstructive. Air is present throughout the colon. Minimal fecal retention over the right colon. No dilated loops of bowel. No free peritoneal air. Remaining bones and soft tissues are unremarkable. IMPRESSION: Nonobstructive bowel gas pattern with minimal fecal retention over the right colon. Electronically Signed   By: Elberta Fortis M.D.   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 Request for diagnostic and therapeutic right thoracentesis. EXAM: ULTRASOUND GUIDED DIAGNOSTIC and THERAPEUTIC RIGHT THORACENTESIS MEDICATIONS: 6 mL 1% lidocaine. COMPLICATIONS: None immediate. PROCEDURE: An ultrasound guided thoracentesis was thoroughly discussed with the patient and questions answered. The benefits, risks, alternatives and complications were also discussed. The patient understands and wishes to proceed with the procedure. Written consent was obtained. Ultrasound was performed to localize and mark an adequate pocket of fluid in the right chest. The area was then prepped and draped in the normal sterile fashion. 1% Lidocaine was used for local anesthesia. Under ultrasound guidance a 6 Fr Safe-T-Centesis catheter was introduced. Thoracentesis was performed. The catheter was removed and a dressing applied. FINDINGS: A total of approximately 1.1 L of clear yellow fluid was removed. Samples were sent to the laboratory as requested by the clinical team. IMPRESSION: Successful diagnostic and therapeutic RIGHT thoracentesis yielding 1.1 L of pleural fluid. Performed by Lynnette Caffey, PA-C Electronically Signed   By: Roanna Banning M.D.    On: 03/09/2023 14:34   DG Chest Port 1 View  Result Date: 03/09/2023 CLINICAL DATA:  Post right-sided thoracentesis. EXAM: PORTABLE CHEST 1 VIEW COMPARISON:  03/06/2023 FINDINGS: Left IJ Port-A-Cath unchanged. Lungs are adequately inflated. Slight worsening opacification over the right base likely residual pleural effusion with associated atelectasis. Left lung is clear. Cardiomediastinal silhouette and remainder of the exam is unchanged. IMPRESSION: Slight worsening opacification over the right base likely residual pleural effusion with associated atelectasis. No pneumothorax. Electronically Signed   By: Elberta Fortis M.D.   On: 03/09/2023 13:30   CT Chest W Contrast  Result Date: 03/06/2023 CLINICAL DATA:  Small cell lung cancer. Increasing back pain and extreme fatigue. 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 * Tracking Code: BO * EXAM: CT CHEST, ABDOMEN, AND PELVIS WITH CONTRAST TECHNIQUE: Multidetector CT imaging of the chest, abdomen and pelvis was performed following the standard protocol during bolus administration of intravenous contrast. RADIATION DOSE REDUCTION: This exam was performed according to the departmental dose-optimization program which includes automated exposure control, adjustment of the mA and/or kV according  to patient size and/or use of iterative reconstruction technique. CONTRAST:  OMNIPAQUE IOHEXOL 300 MG/ML  SOLN COMPARISON:  Chest CT 02/23/2023.  Abdominopelvic CT 02/27/2023. FINDINGS: CT CHEST FINDINGS Cardiovascular: A left subclavian Port-A-Cath has been placed, extending into the superior aspect of the right atrium. There is significant extrinsic compression of the right internal jugular, left brachiocephalic vein and SVC, similar to previous CT. There is also extrinsic mass effect on the right pulmonary artery and veins, also similar. No large vessel occlusion or intravascular thrombus identified. The heart size is stable. A moderate size pericardial effusion appears  unchanged. Mediastinum/Nodes: Again demonstrated is extensive confluent lymphadenopathy extending from the right supraclavicular region through the mediastinum into the right hilum. There is a right supraclavicular component which is incompletely visualized, measuring approximately 4.9 x 3.7 cm on image 1/2. There is a right paratracheal component measuring 4.8 x 4.6 cm, and this has a 3.4 x 2.7 cm component extending anterior to the brachiocephalic veins on images 18/2. Overall, these lymph nodes are similar in size to the prior chest CT, although demonstrate lower central density suggesting response to treatment and necrosis. No progressive adenopathy identified. Stable extrinsic narrowing of the central right-sided airways without occlusion. The esophagus appears unremarkable. Lungs/Pleura: New moderate right and small left pleural effusions (compared with prior chest CT; minimally enlarged compared with more recent abdominal CT). Associated increased compressive atelectasis in the right middle lobe. There are additional increased patchy ground-glass opacities throughout both lungs which may reflect edema or posterior obstructive pneumonitis. There is asymmetric central airway thickening on the right. Underlying mild to moderate centrilobular emphysema. No pneumothorax. Musculoskeletal/Chest wall: No chest wall mass or suspicious osseous findings. CT ABDOMEN AND PELVIS FINDINGS Hepatobiliary: The liver is normal in density without suspicious focal abnormality. Unchanged 9 mm low-density lesion in the left hepatic lobe on image 56/2, likely a cyst. No evidence of gallstones, gallbladder wall thickening or biliary dilatation. Pancreas: Unremarkable. No pancreatic ductal dilatation or surrounding inflammatory changes. Spleen: Normal in size without focal abnormality. Adrenals/Urinary Tract: Both adrenal glands appear normal. No evidence of urinary tract calculus, suspicious renal lesion or hydronephrosis. The  bladder appears normal for its degree of distention. Stomach/Bowel: No enteric contrast administered. The stomach appears unremarkable for its degree of distension. No evidence of bowel wall thickening, distention or surrounding inflammatory change. Prominent stool throughout the colon. Vascular/Lymphatic: There are no enlarged abdominal or pelvic lymph nodes. Aortic and branch vessel atherosclerosis without evidence of aneurysm or large vessel occlusion. Reproductive: The uterus and ovaries appear normal. No adnexal mass. Other: No evidence of abdominal wall mass or hernia. No ascites or pneumoperitoneum. Musculoskeletal: No acute or significant osseous findings. IMPRESSION: 1. No significant change in size of extensive confluent lymphadenopathy extending from the right supraclavicular region through the mediastinum and right hilum. The lymph nodes demonstrate lower central density suggesting response to treatment and necrosis. 2. Grossly stable extrinsic compression of the right internal jugular vein, left brachiocephalic vein and SVC status post Port-A-Cath placement. These findings may contribute to symptoms of SVC syndrome. No evidence of large vessel occlusion or intravascular thrombus. 3. No acute findings or evidence of metastatic disease in the abdomen or pelvis. 4. Mildly increased moderate right and small left pleural effusions with increased compressive atelectasis in the right middle lobe. There are additional increased patchy ground-glass opacities throughout both lungs which may reflect edema or posterior obstructive pneumonitis. 5. Stable moderate pericardial effusion. 6. Aortic Atherosclerosis (ICD10-I70.0) and Emphysema (ICD10-J43.9). Electronically Signed  By: Carey Bullocks M.D.   On: 03/06/2023 18:17   CT ABDOMEN PELVIS W CONTRAST  Result Date: 03/06/2023 CLINICAL DATA:  Small cell lung cancer. Increasing back pain and extreme fatigue. 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 * Tracking Code:  BO * EXAM: CT CHEST, ABDOMEN, AND PELVIS WITH CONTRAST TECHNIQUE: Multidetector CT imaging of the chest, abdomen and pelvis was performed following the standard protocol during bolus administration of intravenous contrast. RADIATION DOSE REDUCTION: This exam was performed according to the departmental dose-optimization program which includes automated exposure control, adjustment of the mA and/or kV according to patient size and/or use of iterative reconstruction technique. CONTRAST:  OMNIPAQUE IOHEXOL 300 MG/ML  SOLN COMPARISON:  Chest CT 02/23/2023.  Abdominopelvic CT 02/27/2023. FINDINGS: CT CHEST FINDINGS Cardiovascular: A left subclavian Port-A-Cath has been placed, extending into the superior aspect of the right atrium. There is significant extrinsic compression of the right internal jugular, left brachiocephalic vein and SVC, similar to previous CT. There is also extrinsic mass effect on the right pulmonary artery and veins, also similar. No large vessel occlusion or intravascular thrombus identified. The heart size is stable. A moderate size pericardial effusion appears unchanged. Mediastinum/Nodes: Again demonstrated is extensive confluent lymphadenopathy extending from the right supraclavicular region through the mediastinum into the right hilum. There is a right supraclavicular component which is incompletely visualized, measuring approximately 4.9 x 3.7 cm on image 1/2. There is a right paratracheal component measuring 4.8 x 4.6 cm, and this has a 3.4 x 2.7 cm component extending anterior to the brachiocephalic veins on images 18/2. Overall, these lymph nodes are similar in size to the prior chest CT, although demonstrate lower central density suggesting response to treatment and necrosis. No progressive adenopathy identified. Stable extrinsic narrowing of the central right-sided airways without occlusion. The esophagus appears unremarkable. Lungs/Pleura: New moderate right and small left pleural  effusions (compared with prior chest CT; minimally enlarged compared with more recent abdominal CT). Associated increased compressive atelectasis in the right middle lobe. There are additional increased patchy ground-glass opacities throughout both lungs which may reflect edema or posterior obstructive pneumonitis. There is asymmetric central airway thickening on the right. Underlying mild to moderate centrilobular emphysema. No pneumothorax. Musculoskeletal/Chest wall: No chest wall mass or suspicious osseous findings. CT ABDOMEN AND PELVIS FINDINGS Hepatobiliary: The liver is normal in density without suspicious focal abnormality. Unchanged 9 mm low-density lesion in the left hepatic lobe on image 56/2, likely a cyst. No evidence of gallstones, gallbladder wall thickening or biliary dilatation. Pancreas: Unremarkable. No pancreatic ductal dilatation or surrounding inflammatory changes. Spleen: Normal in size without focal abnormality. Adrenals/Urinary Tract: Both adrenal glands appear normal. No evidence of urinary tract calculus, suspicious renal lesion or hydronephrosis. The bladder appears normal for its degree of distention. Stomach/Bowel: No enteric contrast administered. The stomach appears unremarkable for its degree of distension. No evidence of bowel wall thickening, distention or surrounding inflammatory change. Prominent stool throughout the colon. Vascular/Lymphatic: There are no enlarged abdominal or pelvic lymph nodes. Aortic and branch vessel atherosclerosis without evidence of aneurysm or large vessel occlusion. Reproductive: The uterus and ovaries appear normal. No adnexal mass. Other: No evidence of abdominal wall mass or hernia. No ascites or pneumoperitoneum. Musculoskeletal: No acute or significant osseous findings. IMPRESSION: 1. No significant change in size of extensive confluent lymphadenopathy extending from the right supraclavicular region through the mediastinum and right hilum. The  lymph nodes demonstrate lower central density suggesting response to treatment and necrosis. 2. Grossly stable extrinsic compression  of the right internal jugular vein, left brachiocephalic vein and SVC status post Port-A-Cath placement. These findings may contribute to symptoms of SVC syndrome. No evidence of large vessel occlusion or intravascular thrombus. 3. No acute findings or evidence of metastatic disease in the abdomen or pelvis. 4. Mildly increased moderate right and small left pleural effusions with increased compressive atelectasis in the right middle lobe. There are additional increased patchy ground-glass opacities throughout both lungs which may reflect edema or posterior obstructive pneumonitis. 5. Stable moderate pericardial effusion. 6. Aortic Atherosclerosis (ICD10-I70.0) and Emphysema (ICD10-J43.9). Electronically Signed   By: Carey Bullocks M.D.   On: 03/06/2023 18:17   DG Chest Portable 1 View  Result Date: 03/06/2023 CLINICAL DATA:  Fatigue and weakness.  On chemotherapy. EXAM: PORTABLE CHEST 1 VIEW COMPARISON:  Chest x-ray 02/28/2023 FINDINGS: Hyperinflation. Small right effusion with adjacent opacity similar to previous. Stable interstitial prominence. Stable cardiopericardial silhouette. No pneumothorax. Again fullness of the right lung hilum. Left IJ chest port in place tip at the SVC right atrial junction. The port is accessed. IMPRESSION: Persistent right-sided effusion adjacent lung opacity with fullness of the right side of the lung hilum and widening of the mediastinum. Hyperinflation. Chest port Electronically Signed   By: Karen Kays M.D.   On: 03/06/2023 16:50   IR IMAGING GUIDED PORT INSERTION  Result Date: 03/02/2023 INDICATION: History of metastatic lung cancer. In need of durable intravenous access for the initiation of chemotherapy. EXAM: IMPLANTED PORT A CATH PLACEMENT WITH ULTRASOUND AND FLUOROSCOPIC GUIDANCE COMPARISON:  Neck CT-02/23/2023 MEDICATIONS: None  ANESTHESIA/SEDATION: Moderate (conscious) sedation was employed during this procedure as administered by the Interventional Radiology RN. A total of Benadryl 25 mg and Fentanyl 25 mcg was administered intravenously. Moderate Sedation Time: 23 minutes. The patient's level of consciousness and vital signs were monitored continuously by radiology nursing throughout the procedure under my direct supervision. CONTRAST:  None FLUOROSCOPY TIME:  24 seconds (4 mGy) COMPLICATIONS: None immediate. PROCEDURE: The procedure, risks, benefits, and alternatives were explained to the patient. Questions regarding the procedure were encouraged and answered. The patient understands and consents to the procedure. Given presence of known right supraclavicular bulky lymphadenopathy, the decision was made to proceed with left internal jugular approach port a catheter placement. As such, the left neck and chest were prepped with chlorhexidine in a sterile fashion, and a sterile drape was applied covering the operative field. Maximum barrier sterile technique with sterile gowns and gloves were used for the procedure. A timeout was performed prior to the initiation of the procedure. Local anesthesia was provided with 1% lidocaine with epinephrine. After creating a small venotomy incision, a micropuncture kit was utilized to access the internal jugular vein. Real-time ultrasound guidance was utilized for vascular access including the acquisition of a permanent ultrasound image documenting patency of the accessed vessel. The microwire was utilized to measure appropriate catheter length. A subcutaneous port pocket was then created along the upper chest wall utilizing a combination of sharp and blunt dissection. The pocket was irrigated with sterile saline. A single lumen clear view power injectable port was chosen for placement. The 8 Fr catheter was tunneled from the port pocket site to the venotomy incision. The port was placed in the pocket.  The external catheter was trimmed to appropriate length. At the venotomy, an 8 Fr peel-away sheath was placed over a guidewire under fluoroscopic guidance. The catheter was then placed through the sheath and the sheath was removed. Final catheter positioning was confirmed and documented  with a fluoroscopic spot radiograph. The port was accessed with a Huber needle, aspirated and flushed with heparinized saline. The venotomy site was closed with an interrupted 4-0 Vicryl suture. The port pocket incision was closed with interrupted 2-0 Vicryl suture. Dermabond and Steri-strips were applied to both incisions. Dressings were applied. The patient tolerated the procedure well without immediate post procedural complication. FINDINGS: After catheter placement, the tip lies within the superior cavoatrial junction. The catheter aspirates and flushes normally and is ready for immediate use. IMPRESSION: Successful placement of a left internal jugular approach power injectable Port-A-Cath. The catheter is ready for immediate use. Electronically Signed   By: Simonne Come M.D.   On: 03/02/2023 15:05   CT BONE MARROW BIOPSY & ASPIRATION  Result Date: 03/02/2023 INDICATION: History of metastatic lung cancer, now with thrombocytopenia. Please perform CT-guided bone marrow biopsy for tissue diagnostic purposes. EXAM: CT-GUIDED BONE MARROW BIOPSY AND ASPIRATION MEDICATIONS: None ANESTHESIA/SEDATION: Moderate (conscious) sedation was employed during this procedure as administered by the Interventional Radiology RN. A total of Benadryl 25 mg, Versed 1 mg and Fentanyl 50 mcg was administered intravenously. Moderate Sedation Time: 10 minutes. The patient's level of consciousness and vital signs were monitored continuously by radiology nursing throughout the procedure under my direct supervision. COMPLICATIONS: None immediate. PROCEDURE: Informed consent was obtained from the patient following an explanation of the procedure, risks,  benefits and alternatives. The patient understands, agrees and consents for the procedure. All questions were addressed. A time out was performed prior to the initiation of the procedure. The patient was positioned prone and non-contrast localization CT was performed of the pelvis to demonstrate the iliac marrow spaces. The operative site was prepped and draped in the usual sterile fashion. Under sterile conditions and local anesthesia, a 22 gauge spinal needle was utilized for procedural planning. Next, an 11 gauge coaxial bone biopsy needle was advanced into the left iliac marrow space. Needle position was confirmed with CT imaging. Initially, a bone marrow aspiration was performed. Next, a bone marrow biopsy was obtained with the 11 gauge outer bone marrow device. The needle was removed and superficial hemostasis was obtained with manual compression. A dressing was applied. The patient tolerated the procedure well without immediate post procedural complication. IMPRESSION: Successful CT guided left iliac bone marrow aspiration and core biopsy. Electronically Signed   By: Simonne Come M.D.   On: 03/02/2023 15:03   IR US CHEST  Result Date: 02/28/2023 CLINICAL DATA:  Evaluate for thoracentesis. EXAM: CHEST ULTRASOUND COMPARISON:  Chest radiograph 02/28/2023 FINDINGS: Small amount of right pleural fluid was identified. No percutaneous window for thoracentesis due to a flap of lung. Thoracentesis not performed. IMPRESSION: Small right pleural effusion. Thoracentesis not performed due to lack of a safe percutaneous window. Electronically Signed   By: Richarda Overlie M.D.   On: 02/28/2023 16:10   NM Bone Scan Whole Body  Result Date: 02/28/2023 CLINICAL DATA:  Lung cancer EXAM: NUCLEAR MEDICINE WHOLE BODY BONE SCAN TECHNIQUE: Whole body anterior and posterior images were obtained approximately 3 hours after intravenous injection of radiopharmaceutical. RADIOPHARMACEUTICALS:  21.0 mCi Technetium-62m MDP IV COMPARISON:   CT scan earlier June 2024 of multiple areas. FINDINGS: Physiologic distribution of radiotracer overall. There is some uptake involving the right midfoot consistent with known provided history of injury in the past. There is slight asymmetric uptake along the proximal left tibia from the other side. There is also some subtle areas of low uptake along the distal femoral metaphysis. Recommend dedicated x-rays.  Mild areas of degenerative uptake such as along the extremities. Of note the kidneys are not as well seen as typical. Please correlate with clinical findings. IMPRESSION: Subtle asymmetric uptake involving the proximal left tibia. Slight low uptake along the distal femurs. Recommend dedicated x-rays if there is no known history. Atypical low uptake of the renal parenchyma. The rest of the findings do not suggest super scan but please correlate with the patient's renal function and other history. This may be technical. Electronically Signed   By: Karen Kays M.D.   On: 02/28/2023 14:40   DG CHEST PORT 1 VIEW  Result Date: 02/28/2023 CLINICAL DATA:  Pleural effusion on right. EXAM: PORTABLE CHEST 1 VIEW COMPARISON:  Radiographs 02/27/2023 and 02/23/2023. Abdominal CT 02/27/2023 and chest CT 02/23/2023. FINDINGS: 0845 hours. Mild patient rotation to the right. Grossly stable heart size and mediastinal contours with known extensive mediastinal lymphadenopathy. There are enlarging right greater than left pleural effusions with increasing atelectasis at both lung bases. No pneumothorax. The bones appear unchanged. Telemetry leads overlie the chest. IMPRESSION: 1. Enlarging right greater than left pleural effusions with increasing bibasilar atelectasis. 2. Grossly stable mediastinal lymphadenopathy from recent chest CT, presumed lung cancer. Electronically Signed   By: Carey Bullocks M.D.   On: 02/28/2023 12:55   MR BRAIN W WO CONTRAST  Result Date: 02/28/2023 CLINICAL DATA:  Staging of small cell lung  carcinoma EXAM: MRI HEAD WITHOUT AND WITH CONTRAST TECHNIQUE: Multiplanar, multiecho pulse sequences of the brain and surrounding structures were obtained without and with intravenous contrast. CONTRAST:  7mL GADAVIST GADOBUTROL 1 MMOL/ML IV SOLN COMPARISON:  None Available. FINDINGS: Brain: No acute infarct, mass effect or extra-axial collection. No acute or chronic hemorrhage. Normal white matter signal. On diffusion-weighted imaging and the FLAIR sequence, there are bilateral convexity signal abnormalities within the subdural space. The midline structures are normal. There is smooth, diffuse pachymeningeal contrast enhancement. There are no intraparenchymal contrast enhancing lesions. Vascular: Major flow voids are preserved. Skull and upper cervical spine: Normal calvarium and skull base. Visualized upper cervical spine and soft tissues are normal. Sinuses/Orbits:Mastoid effusions. Paranasal sinuses are clear. Normal orbits. IMPRESSION: 1. No intraparenchymal metastatic disease. 2. Bilateral convexity signal abnormalities within the subdural space, which may indicate aging subdural blood. 3. Smooth, diffuse pachymeningeal contrast enhancement. 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 Metastatic disease felt less likely given the lack nodularity. Electronically Signed   By: Deatra Robinson M.D.   On: 02/28/2023 02:17   DG CHEST PORT 1 VIEW  Result Date: 02/27/2023 CLINICAL DATA:  Shortness of breath. EXAM: PORTABLE CHEST 1 VIEW COMPARISON:  February 23, 2023. FINDINGS: Stable cardiomegaly. Mild central pulmonary vascular congestion is noted. Minimal bibasilar pulmonary edema is noted with small right pleural effusion. Bony thorax is unremarkable. IMPRESSION: Stable cardiomegaly with mild central pulmonary vascular congestion. Minimal bibasilar pulmonary edema is noted with probable small right pleural  effusion. Electronically Signed   By: Lupita Raider M.D.   On: 02/27/2023 16:27   CT ABDOMEN PELVIS W CONTRAST  Result Date: 02/27/2023 CLINICAL DATA:  Non-small-cell lung cancer. Staging. * Tracking Code: BO * EXAM: CT ABDOMEN AND PELVIS WITH CONTRAST TECHNIQUE: Multidetector CT imaging of the abdomen and pelvis was performed using the standard protocol following bolus administration of intravenous contrast. RADIATION DOSE REDUCTION: This exam was performed according to the departmental dose-optimization program which includes automated exposure control, adjustment  of the mA and/or kV according to patient size and/or use of iterative reconstruction technique. CONTRAST:  75mL OMNIPAQUE IOHEXOL 350 MG/ML SOLN COMPARISON:  Chest CTA 02/23/2023 FINDINGS: Lower chest: Bibasilar collapse/consolidation with bilateral pleural effusions, right greater than left. Pleural effusions are progressive since the chest CT 4 days ago. Hepatobiliary: Small area of low attenuation in the anterior liver, adjacent to the falciform ligament, is in a characteristic location for focal fatty deposition. 9 mm hypodensity in the left liver (15/3) is too small to characterize but statistically is likely benign. There is no evidence for gallstones, gallbladder wall thickening, or pericholecystic fluid. No intrahepatic or extrahepatic biliary dilation. Pancreas: No focal mass lesion. No dilatation of the main duct. No intraparenchymal cyst. No peripancreatic edema. Spleen: No splenomegaly. No suspicious focal mass lesion. Adrenals/Urinary Tract: No adrenal nodule or mass. Kidneys unremarkable. No evidence for hydroureter. The urinary bladder appears normal for the degree of distention. Stomach/Bowel: Stomach is distended with food and fluid. Duodenum is normally positioned as is the ligament of Treitz. No small bowel wall thickening. No small bowel dilatation. The terminal ileum is normal. The appendix is normal. No gross colonic mass. No  colonic wall thickening. Vascular/Lymphatic: Moderate atherosclerotic calcification is noted in the wall of the aorta. There is no gastrohepatic or hepatoduodenal ligament lymphadenopathy. No retroperitoneal or mesenteric lymphadenopathy. No pelvic sidewall lymphadenopathy. Reproductive: Unremarkable. Other: No intraperitoneal free fluid. Musculoskeletal: No worrisome lytic or sclerotic osseous abnormality. IMPRESSION: 1. No definite evidence for metastatic disease in the abdomen or pelvis. 2. 9 mm hypodensity in the left liver is too small to characterize but statistically is likely benign. Attention on follow-up recommended. 3. Bibasilar collapse/consolidation with bilateral pleural effusions, right greater than left. Pleural effusions are progressive since the chest CT 4 days ago. 4.  Aortic Atherosclerosis (ICD10-I70.0). Electronically Signed   By: Kennith Center M.D.   On: 02/27/2023 11:47   [place] CORE BIOPSY (SOFT TISSUE)  Result Date: 02/26/2023 INDICATION: Cervical and supraclavicular adenopathy.  No diagnosis. EXAM: ULTRASOUND-GUIDED RIGHT SUPRACLAVICULAR NODAL MASS BIOPSY COMPARISON:  CT neck and chest, 09/24/2022. MEDICATIONS: None ANESTHESIA/SEDATION: Local anesthetic was administered. COMPLICATIONS: None immediate. TECHNIQUE: Informed written consent was obtained from the patient and/or patient's representative after a discussion of the risks, benefits and alternatives to treatment. Questions regarding the procedure were encouraged and answered. Initial ultrasound scanning demonstrated RIGHT supraclavicular nodal mass. An ultrasound image was saved for documentation purposes. The procedure was planned. A timeout was performed prior to the initiation of the procedure. The operative was prepped and draped in the usual sterile fashion, and a sterile drape was applied covering the operative field. A timeout was performed prior to the initiation of the procedure. Local anesthesia was provided with 1%  lidocaine with epinephrine. Under direct ultrasound guidance, an 18 gauge core needle device was utilized to obtain to obtain 3 core needle biopsies of the RIGHT supraclavicular nodal mass. The samples were placed in saline and submitted to pathology. The needle was removed and superficial hemostasis was achieved with manual compression. Post procedure scan was negative for significant hematoma. A dressing was applied. The patient tolerated the procedure well without immediate postprocedural complication. IMPRESSION: Successful core biopsy of RIGHT supraclavicular nodal mass. Roanna Banning, MD [facility] Electronically Signed   By: Roanna Banning M.D.   On: 02/26/2023 10:15   ECHOCARDIOGRAM COMPLETE  Result Date: 02/24/2023    ECHOCARDIOGRAM REPORT   Patient Name:   Ana Curry Date of Exam:  02/24/2023 Medical Rec #:  [id number]         Height:       68.0 in Accession #:    [id number]        Weight:       157.0 lb Date of Birth:  07-01-1971        BSA:          1.844 m Patient Age:    51 years          BP:           111/73 mmHg Patient Gender: F                 HR:           88 bpm. Exam Location:  Inpatient Procedure: 2D Echo, Cardiac Doppler and Color Doppler Indications:    A-FIB  History:        Patient has no prior history of Echocardiogram examinations.                 Arrythmias:Atrial Fibrillation; Risk Factors:Current Smoker.  Sonographer:    Darlys Gales Referring Phys: [phone] JACOB J STINSON IMPRESSIONS  1. Left ventricular ejection fraction, by estimation, is 60 to 65%. The left ventricle has normal function. The left ventricle has no regional wall motion abnormalities. Left ventricular diastolic parameters are indeterminate.  2. Right ventricular systolic function is normal. The right ventricular size is normal. Tricuspid regurgitation signal is inadequate for assessing PA pressure.  3. The mitral valve is normal in structure. No evidence of  mitral valve regurgitation.  4. The aortic valve is grossly normal. Aortic valve regurgitation is not visualized.  5. The inferior vena cava is dilated in size with <50% respiratory variability, suggesting right atrial pressure of 15 mmHg. FINDINGS  Left Ventricle: Left ventricular ejection fraction, by estimation, is 60 to 65%. The left ventricle has normal function. The left ventricle has no regional wall motion abnormalities. The left ventricular internal cavity size was normal in size. There is  no left ventricular hypertrophy. Left ventricular diastolic parameters are indeterminate. Right Ventricle: The right ventricular size is normal. Right ventricular systolic function is normal. Tricuspid regurgitation signal is inadequate for assessing PA pressure. Left Atrium: Left atrial size was normal in size. Right Atrium: Right atrial size was normal in size. Pericardium: There is no evidence of pericardial effusion. Mitral Valve: The mitral valve is normal in structure. No evidence of mitral valve regurgitation. Tricuspid Valve: Tricuspid valve regurgitation is not demonstrated. Aortic Valve: The aortic valve is grossly normal. Aortic valve regurgitation is not visualized. Aortic valve mean gradient measures 5.0 mmHg. Aortic valve peak gradient measures 7.6 mmHg. Aortic valve area, by VTI measures 2.82 cm. Pulmonic Valve: Pulmonic valve regurgitation is not visualized. Aorta: The aortic root and ascending aorta are structurally normal, with no evidence of dilitation. Venous: The inferior vena cava is dilated in size with less than 50% respiratory variability, suggesting right atrial pressure of 15 mmHg. IAS/Shunts: The interatrial septum was not well visualized.  LEFT VENTRICLE PLAX 2D LVIDd:         5.10 cm   Diastology LVIDs:         3.00 cm   LV e' medial:    7.07 cm/s LV PW:         0.80 cm   LV E/e' medial:  12.3 LV IVS:        0.70 cm   LV e' lateral:   10.20 cm/s LVOT diam:  1.80 cm   LV E/e' lateral: 8.5  LV SV:         76 LV SV Index:   41 LVOT Area:     2.54 cm  RIGHT VENTRICLE             IVC RV S prime:     15.10 cm/s  IVC diam: 2.50 cm TAPSE (M-mode): 2.5 cm LEFT ATRIUM             Index        RIGHT ATRIUM           Index LA Vol (A2C):   21.7 ml 11.77 ml/m  RA Area:     14.00 cm LA Vol (A4C):   27.3 ml 14.81 ml/m  RA Volume:   34.60 ml  18.76 ml/m LA Biplane Vol: 24.7 ml 13.40 ml/m  AORTIC VALVE AV Area (Vmax):    2.42 cm AV Area (Vmean):   2.26 cm AV Area (VTI):     2.82 cm AV Vmax:           138.00 cm/s AV Vmean:          102.000 cm/s AV VTI:            0.269 m AV Peak Grad:      7.6 mmHg AV Mean Grad:      5.0 mmHg LVOT Vmax:         131.00 cm/s LVOT Vmean:        90.500 cm/s LVOT VTI:          0.298 m LVOT/AV VTI ratio: 1.11  AORTA Ao Root diam: 3.20 cm Ao Asc diam:  3.50 cm MITRAL VALVE MV Area (PHT): 5.23 cm     SHUNTS MV Decel Time: 145 msec     Systemic VTI:  0.30 m MV E velocity: 87.00 cm/s   Systemic Diam: 1.80 cm MV A velocity: 102.00 cm/s MV E/A ratio:  0.85 Photographer signed by Carolan Clines Signature Date/Time: 02/24/2023/3:44:27 PM    Final    CT Angio Chest PE W and/or Wo Contrast  Result Date: 02/23/2023 CLINICAL DATA:  Shortness of breath, right neck mass, chest mass, headache * Tracking Code: BO * EXAM: CT ANGIOGRAPHY CHEST WITH CONTRAST TECHNIQUE: Multidetector CT imaging of the chest was performed using the standard protocol during bolus administration of intravenous contrast. Multiplanar CT image reconstructions and MIPs were obtained to evaluate the vascular anatomy. RADIATION DOSE REDUCTION: This exam was performed according to the departmental dose-optimization program which includes automated exposure control, adjustment of the mA and/or kV according to patient size and/or use of iterative reconstruction technique. CONTRAST:  75mL OMNIPAQUE IOHEXOL 350 MG/ML SOLN COMPARISON:  None Available. FINDINGS: Cardiovascular: No filling defect is identified in the  pulmonary arterial tree to suggest pulmonary embolus. Mild atheromatous vascular calcification of the aortic arch. The large mediastinal mass bulges into the SVC posteriorly for example on image 128 series 4, probably from extrinsic compression, definite invasion is not observed. Because of lack of complete opacification from pulmonary arterial timing, patency of the left brachiocephalic vein is uncertain. Moderate pericardial effusion. Mediastinum/Nodes: Large right infrahilar and hilar mass extending into the mediastinum and tracking all the way to the thoracic inlet. This extends around the right mainstem bronchus, right upper lobe bronchus, and bronchus intermedius as well as the right lower lobe and right middle lobe bronchi, and likewise surrounds and narrows the right pulmonary artery and its branches. In the subcarinal region,  the conglomerate mass measures about 7.9 by 5.6 cm (image 144, series 4) and the mass flattens and narrows the SVC without overt occlusion. In the right infrahilar region, the observed mass measures 4.7 by 3.9 cm on image 179 series 4. Presumed lymph node anterior to the SVC in the upper chest measures 2.6 cm in short axis on image 36 series 3. A right lower neck level IV mass/lymph node measures 3.4 cm in short axis on image 10 series 3. Lungs/Pleura: The mass in the right chest displaces the upper trachea to the left. There is substantial narrowing of the right upper lobe bronchus, right middle lobe bronchus, and severe narrowing of the right lower lobe bronchus due to the surrounding mass. Patchy regions of nodularity in the right lower lobe distal to the mass may represent postobstructive pneumonitis or satellite tumor nodules. Centrilobular emphysema. Scarring or atelectasis in the right middle lobe and right upper lobe anteriorly. Mild dependent atelectasis in both lower lobes. Trace right pleural effusion, image 50 series 3. Upper Abdomen: Small nonspecific hypodense lesion in  the left hepatic lobe 0.9 cm in long axis on image 96 series 3. Musculoskeletal: Unremarkable Review of the MIP images confirms the above findings. IMPRESSION: 1. Large right infrahilar and hilar mass extending into the mediastinum and tracking all the way to the thoracic inlet. This extends around the right mainstem bronchus, right upper lobe bronchus, and bronchus intermedius as well as the right lower lobe and right middle lobe bronchi, and likewise surrounds and narrows the right pulmonary artery and its branches. The mass flattens and narrows the SVC without overt occlusion, and is confluent with bulky right level IV neck adenopathy. Top differential diagnostic considerations include lung cancer (such as squamous cell or small cell) versus extensive thoracic and right lower neck lymphoma. 2. Patchy regions of nodularity in the right lower lobe distal to the mass may represent postobstructive pneumonitis or satellite tumor nodules. 3. Presumed lymph node anterior to the SVC in the upper chest measures 2.6 cm in short axis. 4. A right lower neck level IV mass/lymph node measures 3.4 cm in short axis, compatible with malignancy. 5. Moderate pericardial effusion. 6. Trace right pleural effusion. 7. Centrilobular emphysema. 8. Small nonspecific hypodense lesion in the left hepatic lobe 0.9 cm in long axis. 9. No pulmonary embolus is identified. 10. Aortic atherosclerosis. Aortic Atherosclerosis (ICD10-I70.0) and Emphysema (ICD10-J43.9). Electronically Signed   By: Gaylyn Rong M.D.   On: 02/23/2023 13:39   CT Soft Tissue Neck W Contrast  Result Date: 02/23/2023 CLINICAL DATA:  Neck mass, nonpulsatile right sided neck mass EXAM: CT NECK WITH CONTRAST TECHNIQUE: Multidetector CT imaging of the neck was performed using the standard protocol following the bolus administration of intravenous contrast. RADIATION DOSE REDUCTION: This exam was performed according to the departmental dose-optimization program which  includes automated exposure control, adjustment of the mA and/or kV according to patient size and/or use of iterative reconstruction technique. CONTRAST:  75mL OMNIPAQUE IOHEXOL 350 MG/ML SOLN COMPARISON:  None Available. FINDINGS: Pharynx and larynx: Normal. No mass or swelling. Salivary glands: No inflammation, mass, or stone. Thyroid: Normal. Lymph nodes: There is bulky lower cervical and mediastinal lymphadenopathy involving the right level 3 and level 4 lymph node stations, the right supraclavicular region, and the anterior and posterior mediastinum. Cervical lymphadenopathy significant mass effect on the right internal jugular vein (series 5, image 68). Vascular: Negative. Mass effect on the right internal jugular vein, as above Limited intracranial: Negative. Visualized orbits: Negative.  Mastoids and visualized paranasal sinuses: Moderate left-sided mastoid effusion. There is pneumatization of the petrous apices with air-fluid levels. Paranasal sinuses are clear. Orbits are unremarkable. Skeleton: No acute or aggressive process. Upper chest: Mild paraseptal emphysema. See same day CT chest for additional findings Other: None. IMPRESSION: 1. Bulky right lower cervical and mediastinal lymphadenopathy, concerning for metastatic disease or lymphoma. 2. See separate CT chest for additional findings. Emphysema (ICD10-J43.9). Electronically Signed   By: Lorenza Cambridge M.D.   On: 02/23/2023 13:17   CT Head Wo Contrast  Result Date: 02/23/2023 CLINICAL DATA:  Headache EXAM: CT HEAD WITHOUT CONTRAST TECHNIQUE: Contiguous axial images were obtained from the base of the skull through the vertex without intravenous contrast. RADIATION DOSE REDUCTION: This exam was performed according to the departmental dose-optimization program which includes automated exposure control, adjustment of the mA and/or kV according to patient size and/or use of iterative reconstruction technique. COMPARISON:  None Available. FINDINGS:  Brain: There is no acute intracranial hemorrhage, extra-axial fluid collection, or acute infarct Parenchymal volume is normal. The ventricles are normal in size. Gray-white differentiation is preserved The pituitary and suprasellar region are normal. There is no mass lesion. There is no mass effect or midline shift. Vascular: No hyperdense vessel or unexpected calcification. Skull: Normal. Negative for fracture or focal lesion. Sinuses/Orbits: The imaged paranasal sinuses are clear. The globes and orbits are unremarkable. Other: There are small bilateral mastoid effusions. IMPRESSION: 1. No acute intracranial pathology. 2. Small bilateral mastoid effusions. Electronically Signed   By: Lesia Hausen M.D.   On: 02/23/2023 13:15   DG Chest Port 1 View  Result Date: 02/23/2023 CLINICAL DATA:  Sepsis EXAM: PORTABLE CHEST 1 VIEW COMPARISON:  X-ray 01/09/2023 FINDINGS: No pneumothorax, effusion or edema. Calcified aorta. Overlapping cardiac leads. There is new widening of the mediastinum compared to prior x-ray and fullness of the right lung hilum. Recommend further evaluation with CT to delineate for potential mass. IMPRESSION: New widening of the mediastinum with masslike fullness in the right lung hilum. Recommend follow up contrast CT to further delineate Electronically Signed   By: Karen Kays M.D.   On: 02/23/2023 13:11

## 2023-03-12 DEATH — deceased

## 2023-03-13 ENCOUNTER — Ambulatory Visit: Payer: Medicaid Other

## 2023-03-13 ENCOUNTER — Other Ambulatory Visit: Payer: Self-pay

## 2023-03-13 DIAGNOSIS — Z515 Encounter for palliative care: Secondary | ICD-10-CM

## 2023-03-13 DIAGNOSIS — G893 Neoplasm related pain (acute) (chronic): Secondary | ICD-10-CM

## 2023-03-13 DIAGNOSIS — Z66 Do not resuscitate: Secondary | ICD-10-CM

## 2023-03-13 DIAGNOSIS — D702 Other drug-induced agranulocytosis: Secondary | ICD-10-CM

## 2023-03-13 DIAGNOSIS — J9601 Acute respiratory failure with hypoxia: Secondary | ICD-10-CM | POA: Diagnosis not present

## 2023-03-13 DIAGNOSIS — I871 Compression of vein: Secondary | ICD-10-CM

## 2023-03-13 DIAGNOSIS — K5909 Other constipation: Secondary | ICD-10-CM

## 2023-03-13 DIAGNOSIS — Z7189 Other specified counseling: Secondary | ICD-10-CM

## 2023-03-13 DIAGNOSIS — Z79899 Other long term (current) drug therapy: Secondary | ICD-10-CM

## 2023-03-13 LAB — BASIC METABOLIC PANEL
Anion gap: 9 (ref 5–15)
BUN: 18 mg/dL (ref 6–20)
CO2: 25 mmol/L (ref 22–32)
Calcium: 8.4 mg/dL — ABNORMAL LOW (ref 8.9–10.3)
Chloride: 97 mmol/L — ABNORMAL LOW (ref 98–111)
Creatinine, Ser: 0.72 mg/dL (ref 0.44–1.00)
GFR, Estimated: >60 mL/min (ref >60)
Glucose, Bld: 116 mg/dL — ABNORMAL HIGH (ref 70–99)
Potassium: 3.8 mmol/L (ref 3.5–5.1)
Sodium: 131 mmol/L — ABNORMAL LOW (ref 135–145)

## 2023-03-13 LAB — CBC WITH DIFFERENTIAL/PLATELET
Abs Immature Granulocytes: 0.13 10*3/uL — ABNORMAL HIGH (ref 0.00–0.07)
Basophils Absolute: 0 10*3/uL (ref 0.0–0.1)
Basophils Relative: 1 %
Eosinophils Absolute: 0 10*3/uL (ref 0.0–0.5)
Eosinophils Relative: 1 %
HCT: 22.2 % — ABNORMAL LOW (ref 36.0–46.0)
Hemoglobin: 7.3 g/dL — ABNORMAL LOW (ref 12.0–15.0)
Immature Granulocytes: 10 %
Lymphocytes Relative: 64 %
Lymphs Abs: 0.8 10*3/uL (ref 0.7–4.0)
MCH: 32 pg (ref 26.0–34.0)
MCHC: 32.9 g/dL (ref 30.0–36.0)
MCV: 97.4 fL (ref 80.0–100.0)
Monocytes Absolute: 0.1 10*3/uL (ref 0.1–1.0)
Monocytes Relative: 6 %
Neutro Abs: 0.2 10*3/uL — CL (ref 1.7–7.7)
Neutrophils Relative %: 18 %
Platelets: 13 10*3/uL — CL (ref 150–400)
RBC: 2.28 MIL/uL — ABNORMAL LOW (ref 3.87–5.11)
RDW: 14.9 % (ref 11.5–15.5)
WBC: 1.3 10*3/uL — CL (ref 4.0–10.5)
nRBC: 2.4 % — ABNORMAL HIGH (ref 0.0–0.2)

## 2023-03-13 LAB — TYPE AND SCREEN

## 2023-03-13 MED ORDER — METOPROLOL TARTRATE 12.5 MG HALF TABLET: 12.5000 mg | ORAL_TABLET | Freq: Two times a day (BID) | ORAL | Status: DC

## 2023-03-13 MED ORDER — HYDROMORPHONE 1 MG/ML IV SOLN
INTRAVENOUS | Status: DC
  Administered 2023-03-13: 1.97 mg via INTRAVENOUS
  Administered 2023-03-13: 2.45 mg via INTRAVENOUS
  Administered 2023-03-14: 1.48 mg via INTRAVENOUS
  Administered 2023-03-14: 1.54 mg via INTRAVENOUS
  Administered 2023-03-15: 2.51 mg via INTRAVENOUS
  Filled 2023-03-13: qty 30

## 2023-03-13 NOTE — Progress Notes (Signed)
PROGRESS NOTE  Ana Curry  DOB: [birth year]  PCP: [facility] ZHY:[id number]  DOA: 03/06/2023  LOS: 7 days  Hospital Day: 8  Brief narrative: Ana Curry is a 52 y.o. female with PMH significant for lifelong smoking who was recently hospitalized 6/14 to 6/24 for worsening shortness of breath for 2 months.  CT chest at that time showed right infrahilar/hilar mass that extended around the right mainstem bronchus, right upper lobe and all the way to the thoracic inlet causing SVC syndrome.  She was seen by oncology. 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  CT abdomen pelvis and MRI brain did not show any metastatic disease. Bone scan showed subtle asymmetric uptake involving proximal left tibia. 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 6/21, started on chemotherapy with first cycle on 6/21.   6/21 to 6/24, underwent radiation treatment on 6/21 and 6/24. 6/22, Port-A-Cath was placed 6/24, discharged home on home oxygen to follow-up with oncology as an outpatient. Of note, during that hospital stay, patient also was noted to be in new onset A-fib with RVR.  She was started on oral Cardizem.  Anticoagulation was not restarted because of thrombocytopenia.  6/25, patient was seen at radiation clinic.  Noted to be ill-appearing, hypotensive, tachycardic, short of breath, had generalized weakness and hence sent to the ED.    In the ED, it was found that they had BP 97/68, pulse 101, RR 22, temp 98.2 F, SpO2 93% on 3 L O2 via [place].  Significant findings included WBC 4.7, hemoglobin 10.0, platelets 33,000, sodium 128, potassium 3.6, bicarb 26, BUN 17, creatinine 0.76, serum glucose 97. UA negative. CT chest/abdomen/pelvis obtained. No significant change in size of extensive confluent lymphadenopathy extending from right supraclavicular region through the mediastinum and right hilum. Stable extrinsic compression of the right internal  jugular vein, left brachiocephalic vein, and SVC s/p Port-A-Cath placement. No evidence of large vessel occlusion or intravascular thrombus. No acute findings or evidence of metastatic disease in the abdomen or pelvis. Mildly increased moderate right and small left pleural effusions noted with increased compressive atelectasis in the right middle lobe. Additionally increased patchy groundglass opacities throughout both lungs noted. Stable moderate pericardial effusion seen.   She was given IV hydration, IV pain meds, IV antiemetics Admitted to [facility] Oncology and palliative care were consulted. 6/29, because of uncontrolled pain, patient was started on PCA pump.  Subjective: Patient was seen and examined this morning. Pain better controlled with PCA pump  Patient however seems to be struggling to breathe.  On 11 L high flow oxygen by nasal cannula. Remains tachycardic.  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 Continues to have shortness of breath. Has swelling of the face due to SVC syndrome.  Radiation to continue.  IR has been consulted to see if patient is a candidate for SVC stent. Oncology following. Currently on 11 L high flow oxygen nasal cannula.   Cancer associated pain and anxiety Complains of pain throughout whole body today.  Currently on OxyContin 10 mg BID as well as Valium scheduled twice daily. 6/29, because of the severity of pain, patient was started on Dilaudid PCA with basal and bolus regimen..  Palliative care following.  Sinus tachycardia H/o paroxysmal A-fib Moderate pericardial effusion Continues to to be on sinus tachy since admission.  Probably pain related.  CT chest on admission also showed moderate pericardial effusion.  Clinically not in tamponade.  Limited echo ordered. On recent admission, patient  developed A-fib with RVR and was started on amiodarone.  Continue amiodarone Not on anticoagulation due to thrombocytopenia.  Thrombocytopenia Low platelets due to bone marrow involvement.  No active bleeding. 1 unit platelet transfusion given on 6/27. Platelet level trending down.  13,000 today.  Per oncology recommendation, to transfuse if less than 10,000 or actively bleeding.  Continue monitor Recent Labs  Lab 03/06/23 1614 03/07/23 0525 03/08/23 0323 03/09/23 0341 03/10/23 0340 03/11/23 0250 03/12/23 0754 03/12/23 1910 03/13/23 0340  PLT 33* 31* 27* 40* 29* 20* 15* 13* 13*    Hyponatremia Likely due to poor intake. Recent Labs  Lab 03/06/23 1614 03/07/23 0525 03/08/23 0323 03/09/23 0341 03/12/23 1910 03/13/23 0340  NA 128* 130* 134* 132* 132* 131*    Opiate related constipation Avoiding suppository or enema because of neutropenia Continue miralax, senna, milk of magnesia. 7/1, 1 dose of Relistor was given after which patient had a big bowel movement.   Mobility: Encourage ambulation  Goals of care   Code Status: DNR.  Palliative care was consult was appreciated.  DNR today.   DVT prophylaxis:  SCDs Start: 03/06/23 1947   Antimicrobials: None Fluid: NS at 30 mL/h  Consultants: Oncology palliative care, IR Family Communication: No family members at bedside today  Status: Patient Level of care:  Stepdown   Patient from: Home Anticipated d/c to: Pending clinical course Needs to continue in-hospital care:  Significant pain.  Remains on IV Dilaudid by PCA pump.  Respiratory status worsening.    Diet:  Diet Order             Diet regular Fluid consistency: Thin  Diet effective now                   Scheduled Meds:  amiodarone  200 mg Oral BID   Chlorhexidine Gluconate Cloth  6 each Topical Daily   diazepam  10 mg Oral BID   HYDROmorphone   Intravenous Q4H   ipratropium-albuterol  3 mL Nebulization Q6H   lactulose  10 g Oral TID    magnesium hydroxide  30 mL Oral BID   nicotine  14 mg Transdermal Daily   polyethylene glycol  17 g Oral BID   senna-docusate  1 tablet Oral BID   sodium chloride flush  3 mL Intravenous Q12H   Tbo-Filgrastim  300 mcg Subcutaneous q1800    PRN meds: acetaminophen **OR** acetaminophen, albuterol, bisacodyl, calamine, dextromethorphan-guaiFENesin, LORazepam, ondansetron (ZOFRAN) IV   Infusions:   sodium chloride 30 mL/hr at 03/13/23 0600     Antimicrobials: Anti-infectives (From admission, onward)    None       Nutritional status:  Body mass index is 27.19 kg/m.          Objective: Vitals:   03/13/23 1151 03/13/23 1200  BP:  (!) 121/51  Pulse:  (!) 107  Resp: (!) 33 (!) 26  Temp:  (!) 96.8 F (36 C)  SpO2: 100% 100%    Intake/Output Summary (Last 24 hours) at 03/13/2023 1352 Last data filed at 03/13/2023 1005 Gross per 24 hour  Intake 1840.87 ml  Output 450 ml  Net 1390.87 ml    Filed Weights   03/06/23 2315 03/09/23 0500 03/13/23 0500  Weight: 77.1 kg 78.2 kg 81.1 kg   Weight change:  Body mass index is 27.19 kg/m.   Physical Exam: General exam:  Pleasant, middle-aged Caucasian female.  Sick looking.  In respiratory distress Skin: No rashes, lesions or ulcers. HEENT: Atraumatic, normocephalic, no obvious bleeding Lungs: Grunting, coarse bilateral breath sounds. CVS: Persistent tachycardia, A-fib, no murmur GI/Abd soft, mild diffuse tenderness, distention present, bowel sound present CNS : Alert, awake, oriented x 3 psychiatry: Sad affect Extremities: No pedal edema, no calf tenderness  Data Review: I have personally reviewed the laboratory data and studies available.  F/u labs ordered Unresulted Labs (From admission, onward)     Start     Ordered   03/08/23 0500  CBC with Differential/Platelet  Daily,   R     Question:  Specimen collection method  Answer:  IV Team=IV Team collect   03/07/23 1007            Total time spent in review of  labs and imaging, patient evaluation, formulation of plan, documentation and communication with family: 55 minutes  Signed, Lorin Glass, MD [facility] 03/13/2023

## 2023-03-13 NOTE — Progress Notes (Signed)
       Overnight   NAME: MARALENE MANFRA MRN: [id number] DOB : 10-03-1970    Date of Service   03/13/2023   HPI/Events of Note    Notified by RN for concern over fluid volume. Found to have reduced Hgb amount and other Electrolyte concerns IVF fluid rate changed with improvement , Hgb returned <7    Interventions/ Plan   1 unit of PRBCs ordered Watch for fluid volume overload signs  Resume other orders      Improvement noted in Hgb and vitals after unit of PRBCs   Latest Reference Range & Units 03/12/23 19:10 03/13/23 03:40  Sodium 135 - 145 mmol/L 132 (L) 131 (L)  Potassium 3.5 - 5.1 mmol/L 3.8 3.8  Chloride 98 - 111 mmol/L 97 (L) 97 (L)  CO2 22 - 32 mmol/L 25 25  Glucose 70 - 99 mg/dL 409 (H) 811 (H)  BUN 6 - 20 mg/dL 21 (H) 18  Creatinine 9.14 - 1.00 mg/dL 7.82 9.56  Calcium 8.9 - 10.3 mg/dL 8.5 (L) 8.4 (L)  Anion gap 5 - 15  10 9   Phosphorus 2.5 - 4.6 mg/dL 3.6   Magnesium 1.7 - 2.4 mg/dL 2.4   GFR, Estimated >21 mL/min >60 >60  Lactic Acid, Venous 0.5 - 1.9 mmol/L 1.5   WBC 4.0 - 10.5 K/uL 1.3 (LL) 1.3 (LL)  RBC 3.87 - 5.11 MIL/uL 2.00 (L) 2.28 (L)  Hemoglobin 12.0 - 15.0 g/dL 6.3 (LL) 7.3 (L)  HCT 30.8 - 46.0 % 19.5 (L) 22.2 (L)  MCV 80.0 - 100.0 fL 97.5 97.4  MCH 26.0 - 34.0 pg 31.5 32.0  MCHC 30.0 - 36.0 g/dL 65.7 84.6  RDW 96.2 - 95.2 % 15.0 14.9  Platelets 150 - 400 K/uL 13 (LL) 13 (LL)  nRBC 0.0 - 0.2 % 2.4 (H) 2.4 (H)  (LL): Data is critically low (L): Data is abnormally low (H): Data is abnormally high  Chinita Greenland BSN MSNA MSN ACNPC-AG Acute Care Nurse Practitioner [facility] [place]

## 2023-03-13 NOTE — Progress Notes (Signed)
  Daily Progress Note   Patient Name: Ana Curry       Date: 03/13/2023 DOB: 07-Jun-1971  Age: 52 y.o. MRN#: [id number] Attending Physician: Lorin Glass, MD Primary Care Physician: [facility] Admit Date: 03/06/2023 Length of Stay: 7 days  Met with patient this morning. Will place full progress note as soon as able. Patient's medical status appears to be rapidly deteriorating due to SVC syndrome from underlying cancer. Discussed code status. Patient agreeing with change of code status to DNR, continue appropraite medical care at this time. Have updated code status accordingly.    Alvester Morin, DO Palliative Care Provider PMT # [phone]

## 2023-03-13 NOTE — Progress Notes (Signed)
Daily Progress Note   Patient Name: Ana Curry       Date: 03/13/2023 DOB: Dec 12, 1970  Age: 52 y.o. MRN#: [id number] Attending Physician: Lorin Glass, MD Primary Care Physician: [facility] Admit Date: 03/06/2023 Length of Stay: 7 days  Reason for Consultation/Follow-up: Establishing goals of care and symptom management  Subjective:   CC: Worsening shortness of breath. Following up regarding complex medical decision making and symptom management.   Subjective:  Discussed care with primary hospitalist and bedside RN prior to presenting to bedside.  When presenting to bedside in the morning, patient appeared to have increased work of breathing.  Patient was able to participate in conversation despite increased work of breathing.  Introduced myself as a member of the palliative medicine team.  Discussed patient's medical illness and symptom burden at this time.  Discussed use of PCA with adjustment of medications to assist with worsening shortness of breath likely from underlying SVC syndrome.  Patient has only been able to tolerate 1 dose of radiation which could be causing inflammatory reaction worsening current symptoms.  Noted would follow-up with hospitalist to determine if IR could provide further workup with stent.  At this time will increase patient's bolus dose to assist with dyspnea management which patient agreed with.  Also able to discuss patient's CODE STATUS at this time.  Expressed worry that if patient was taken off her heart were to stop or she were to stop breathing, interventions such as chest compressions and intubation on mechanical ventilation would not lead to good quality of life outcome due to her underlying metastatic malignancy.  Explained differences between full code and DNR, continue appropriate medical care.  Patient agreeing with CODE STATUS changed to DNR at this time.  Will continue appropriate medical interventions to hopefully assist with  management of current symptom and cancer burden.  Followed up later in afternoon with chaplain as well. HCPOA documentation had been requested.  Patient easily able to express that she would like her father to make medical decisions for her if she was unable to make medical decisions for herself.  Patient does not want her stepmother involved in any medical decision making.  Noted patient does not need HCPOA documentation for this.  Patient's stepmother has been removed from EMR and so only her father's information is present and he will be the person informed about her care if needed.  Also discussed that while patient is able to participate actively in conversation, will continue to discuss medical care with her and follow her wishes for medical interventions.  Patient acknowledged this.  Chaplain spent time providing support.  Review of Systems Shortness of breath  Objective:   Vital Signs:  BP (!) 127/94   Pulse (!) 109   Temp 98.1 F (36.7 C) (Oral)   Resp (!) 29   Ht 5\' 8"  (1.727 m)   Wt 81.1 kg   LMP 01/22/2017   SpO2 100%   BMI 27.19 kg/m   Physical Exam: General: alert, awake, chronically ill appearing Eyes: No drainage noted HENT: Dry mucous membranes Cardiovascular: Tachycardia noted Respiratory: increased work of breathing noted with accessory muscle use Abdomen: not distended Extremities: Moving all appropriately Skin: no rashes or lesions on visible skin Neuro: A&Ox4, following commands easily Psych: appropriately answers all questions  Imaging:  I personally reviewed recent imaging.   Assessment & Plan:   Assessment: Patient is a 52 year old female with a past medical history of metastatic small cell lung cancer complicated by SVC  syndrome who was admitted on 03/06/2023 from radiation clinic for management of hypotension, tachycardia, generalized weakness, and shortness of breath.  Patient had recently been hospitalized from 6/14-24 for worsening shortness of  breath of the past 2 months and was found to have SVC syndrome.  Since admission, patient has received management for acute hypoxic respiratory failure due to to her extensive SCLC.  Bone marrow biopsy has shown metastatic disease as well.  Continuing radiation while admitted.  Oncology following with patient's care as well.  Palliative medicine team consulted to assist with complex medical decision making and symptom management.  Recommendations/Plan: # Complex medical decision making/goals of care:  - Extensive conversation with patient as documented above in HPI.  Discussed at length patient's CODE STATUS and differences between full code and DNR, continue appropriate medical care.  Expressed worry that if patient was sick enough that her heart were to stop or she were to stop breathing, interventions such as cardiac resuscitation and mechanical intubation would not improve her all overall quality of life due to her metastatic small cell lung cancer.  Patient agreeing with change of CODE STATUS to DNR.  Will continue appropriate medical interventions at this time.  -Patient has clearly stated that if she was unable to make medical decisions for herself, her father would make medical decisions on her behalf.  Patient does not have any children.  Patient does NOT want her stepmother involved in medical conversations.  -  Code Status: DNR  # Symptom management:  -Dyspnea, in the setting of metastatic SCLC   -Change IV dilaudid PCA to 0.5mg  q70min bolus. Continue IV dilaudid basal rate at 0.3mg /hr.    -Discussed with hospitalist who will follow up with IR to determine if stent placement would assist with dyspnea management from SVC as radiation takes time and symptoms appear to be overall worsening today.  # Psychosocial Support:  -Father  # Discharge Planning: To Be Determined  Discussed with: patient, bedside RN, chaplain  Thank you for allowing the palliative care team to participate in the care  [facility].  Alvester Morin, DO Palliative Care Provider PMT # [phone]  If patient remains symptomatic despite maximum doses, please call PMT at [phone] between 0700 and 1900. Outside of these hours, please call attending, as PMT does not have night coverage.  *Please note that this is a verbal dictation therefore any spelling or grammatical errors are due to the "[employer]" system interpretation.

## 2023-03-13 NOTE — Progress Notes (Signed)
OT Cancellation Note  Patient Details Name: BRODIE VOISINE MRN: [id number] DOB: 1970-12-04   Cancelled Treatment:    Reason Eval/Treat Not Completed: Medical issues which prohibited therapy Nursing is asking for therapy to hold off at this time. OT to continue to follow and check back on 7/3.  Rosalio Loud, MS Acute Rehabilitation Department Office# [phone]  03/13/2023, 8:20 AM

## 2023-03-13 NOTE — Progress Notes (Signed)
Small amount of blood oozing at site after needle exchange on PAC. Unit RN at bedside and aware. Advised to monitor dressing for saturation, change dressing as needed and consult IV team as needed.

## 2023-03-13 NOTE — Progress Notes (Signed)
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   Pain from procedure/lower back pain Could be an element of bone pain from cancer involvement of her bone marrow She will continue pain medications as needed We discussed risk of constipation with chronic pain medicine   Opiate induced constipation I suspect an element of her pain could be related to constipation This is seen on her imaging study Recommend laxative therapy I recommend avoid enema or suppository due to recent chemotherapy and anticipated pancytopenia in the future Trial of methylnaltrexone yesterday with good success   Respiratory failure due to lung cancer Continue oxygen therapy   Acquired pancytopenia Possible bone marrow involvement Anticipate her platelet count will drop over the next few days. Recommend G-CSF support daily until [facility] is greater than 1.5 Will check CBC daily and transfuse if signs of bleeding or platelet count less than 10,000   Low BP, hyponatremia, poor oral intake This has improved with IV fluids from prior hospitalization Recommend gentle IV fluid hydration with normal saline   Goals of care She will continue radiation therapy in the hospital I do not think it is realistic to expect her to be discharged home as the patient is not able to take care of herself even with family support and she is readmitted in less than 24 hours after discharge   Discharge planning Recommend consideration for skilled nursing facility as part of her discharge planning Due to progressive pancytopenia, I anticipate she will be here  throughout the week. It is not safe to be discharged until her platelet counts not trending up without transfusion support which I anticipate will be sometime this week    All questions were answered. The patient knows to call the clinic with any problems, questions or concerns.   The total time spent in the appointment was 40 minutes encounter with patients including review of chart and various tests results, discussions about plan of care and coordination of care plan  Artis Delay, MD 03/13/2023 3:52 PM  Subjective:  She is somewhat sedated.  She has PCA pump ongoing for pain control.  She had an excellent bowel movement yesterday after treatment with methylnaltrexone.  She is inquiring when she can be discharged.  No signs of bleeding  Objective:  Vitals:   03/13/23 1200 03/13/23 1353  BP: (!) 121/51   Pulse: (!) 107   Resp: (!) 26   Temp: (!) 96.8 F (36 C)   SpO2: 100% 98%     Intake/Output Summary (Last 24 hours) at 03/13/2023 1552 Last data filed at 03/13/2023 1200 Gross per 24 hour  Intake 2028.71 ml  Output 450 ml  Net 1578.71 ml    GENERAL:alert, no distress and comfortable.   NEURO: alert but somewhat sedated   Labs:  Recent Labs    03/05/23 0822 03/06/23 1614 03/07/23 0525 03/08/23 0323 03/09/23 0341 03/12/23 1910 03/13/23 0340  NA 133* 128* 130*   < > 132* 132* 131*  K 3.6 3.6 3.6   < > 3.8 3.8 3.8  CL 101 95* 95*   < > 96* 97* 97*  CO2 24 26 25    < >  25 25 25   GLUCOSE 113* 97 118*   < > 108* 159* 116*  BUN 15 17 19    < > 15 21* 18  CREATININE 0.83 0.76 0.91   < > 0.82 0.73 0.72  CALCIUM 9.4 8.9 9.2   < > 9.3 8.5* 8.4*  GFRNONAA >60 >60 >60   < > >60 >60 >60  PROT 5.9* 5.8* 5.6*  --   --   --   --   ALBUMIN 2.8* 2.8* 2.6*  --   --   --   --   AST 39 44* 45*  --   --   --   --   ALT 26 24 24   --   --   --   --   ALKPHOS 86 84 80  --   --   --   --   BILITOT 0.5 0.9 0.7  --   --   --   --    < > = values in this interval not displayed.    Studies:   DG Chest Port 1 View  Result Date: 03/12/2023 CLINICAL DATA:  Small cell lung cancer, dyspnea, EXAM: PORTABLE CHEST 1 VIEW COMPARISON:  03/09/2023 FINDINGS: Improving right basilar consolidation. Stable right paratracheal soft tissue thickening likely related to pathologic adenopathy. Small right pleural effusion persists. Stable pulmonary insufflation. No pneumothorax. No pleural effusion on the left. Left internal jugular chest port tip seen within the right atrium. Cardiac size within normal limits. No acute bone abnormality. IMPRESSION: 1. Improving right basilar consolidation. Stable small right pleural effusion. 2. Stable right paratracheal soft tissue thickening likely related to pathologic adenopathy. Electronically Signed   By: Helyn Numbers M.D.   On: 03/12/2023 20:35   DG Abd Portable 1V  Result Date: 03/11/2023 CLINICAL DATA:  Constipation.  No bowel movement 10 days. EXAM: PORTABLE ABDOMEN - 1 VIEW COMPARISON:  CT 03/06/2023 FINDINGS: Bowel gas pattern is nonobstructive. Air is present throughout the colon. Minimal fecal retention over the right colon. No dilated loops of bowel. No free peritoneal air. Remaining bones and soft tissues are unremarkable. IMPRESSION: Nonobstructive bowel gas pattern with minimal fecal retention over the right colon. Electronically Signed   By: Elberta Fortis M.D.   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 Request for diagnostic and therapeutic right thoracentesis. EXAM: ULTRASOUND GUIDED DIAGNOSTIC and THERAPEUTIC RIGHT THORACENTESIS MEDICATIONS: 6 mL 1% lidocaine. COMPLICATIONS: None immediate. PROCEDURE: An ultrasound guided thoracentesis was thoroughly discussed with the patient and questions answered. The benefits, risks, alternatives and complications were also discussed. The  patient understands and wishes to proceed with the procedure. Written consent was obtained. Ultrasound was performed to localize and mark an adequate pocket of fluid in the right chest. The area was then prepped and draped in the normal sterile fashion. 1% Lidocaine was used for local anesthesia. Under ultrasound guidance a 6 Fr Safe-T-Centesis catheter was introduced. Thoracentesis was performed. The catheter was removed and a dressing applied. FINDINGS: A total of approximately 1.1 L of clear yellow fluid was removed. Samples were sent to the laboratory as requested by the clinical team. IMPRESSION: Successful diagnostic and therapeutic RIGHT thoracentesis yielding 1.1 L of pleural fluid. Performed by Lynnette Caffey, PA-C Electronically Signed   By: Roanna Banning M.D.   On: 03/09/2023 14:34   DG Chest Port 1  View  Result Date: 03/09/2023 CLINICAL DATA:  Post right-sided thoracentesis. EXAM: PORTABLE CHEST 1 VIEW COMPARISON:  03/06/2023 FINDINGS: Left IJ Port-A-Cath unchanged. Lungs are adequately inflated. Slight worsening opacification over the right base likely residual pleural effusion with associated atelectasis. Left lung is clear. Cardiomediastinal silhouette and remainder of the exam is unchanged. IMPRESSION: Slight worsening opacification over the right base likely residual pleural effusion with associated atelectasis. No pneumothorax. Electronically Signed   By: Elberta Fortis M.D.   On: 03/09/2023 13:30   CT Chest W Contrast  Result Date: 03/06/2023 CLINICAL DATA:  Small cell lung cancer. Increasing back pain and extreme fatigue. 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 * Tracking Code: BO * EXAM: CT CHEST, ABDOMEN, AND PELVIS WITH CONTRAST TECHNIQUE: Multidetector CT imaging of the chest, abdomen and pelvis was performed following the standard protocol during bolus administration of intravenous contrast. RADIATION DOSE REDUCTION: This exam was performed according to the departmental  dose-optimization program which includes automated exposure control, adjustment of the mA and/or kV according to patient size and/or use of iterative reconstruction technique. CONTRAST:  OMNIPAQUE IOHEXOL 300 MG/ML  SOLN COMPARISON:  Chest CT 02/23/2023.  Abdominopelvic CT 02/27/2023. FINDINGS: CT CHEST FINDINGS Cardiovascular: A left subclavian Port-A-Cath has been placed, extending into the superior aspect of the right atrium. There is significant extrinsic compression of the right internal jugular, left brachiocephalic vein and SVC, similar to previous CT. There is also extrinsic mass effect on the right pulmonary artery and veins, also similar. No large vessel occlusion or intravascular thrombus identified. The heart size is stable. A moderate size pericardial effusion appears unchanged. Mediastinum/Nodes: Again demonstrated is extensive confluent lymphadenopathy extending from the right supraclavicular region through the mediastinum into the right hilum. There is a right supraclavicular component which is incompletely visualized, measuring approximately 4.9 x 3.7 cm on image 1/2. There is a right paratracheal component measuring 4.8 x 4.6 cm, and this has a 3.4 x 2.7 cm component extending anterior to the brachiocephalic veins on images 18/2. Overall, these lymph nodes are similar in size to the prior chest CT, although demonstrate lower central density suggesting response to treatment and necrosis. No progressive adenopathy identified. Stable extrinsic narrowing of the central right-sided airways without occlusion. The esophagus appears unremarkable. Lungs/Pleura: New moderate right and small left pleural effusions (compared with prior chest CT; minimally enlarged compared with more recent abdominal CT). Associated increased compressive atelectasis in the right middle lobe. There are additional increased patchy ground-glass opacities throughout both lungs which may reflect edema or posterior obstructive  pneumonitis. There is asymmetric central airway thickening on the right. Underlying mild to moderate centrilobular emphysema. No pneumothorax. Musculoskeletal/Chest wall: No chest wall mass or suspicious osseous findings. CT ABDOMEN AND PELVIS FINDINGS Hepatobiliary: The liver is normal in density without suspicious focal abnormality. Unchanged 9 mm low-density lesion in the left hepatic lobe on image 56/2, likely a cyst. No evidence of gallstones, gallbladder wall thickening or biliary dilatation. Pancreas: Unremarkable. No pancreatic ductal dilatation or surrounding inflammatory changes. Spleen: Normal in size without focal abnormality. Adrenals/Urinary Tract: Both adrenal glands appear normal. No evidence of urinary tract calculus, suspicious renal lesion or hydronephrosis. The bladder appears normal for its degree of distention. Stomach/Bowel: No enteric contrast administered. The stomach appears unremarkable for its degree of distension. No evidence of bowel wall thickening, distention or surrounding inflammatory change. Prominent stool throughout the colon. Vascular/Lymphatic: There are no enlarged abdominal or pelvic lymph nodes. Aortic and branch vessel atherosclerosis without evidence of aneurysm or  large vessel occlusion. Reproductive: The uterus and ovaries appear normal. No adnexal mass. Other: No evidence of abdominal wall mass or hernia. No ascites or pneumoperitoneum. Musculoskeletal: No acute or significant osseous findings. IMPRESSION: 1. No significant change in size of extensive confluent lymphadenopathy extending from the right supraclavicular region through the mediastinum and right hilum. The lymph nodes demonstrate lower central density suggesting response to treatment and necrosis. 2. Grossly stable extrinsic compression of the right internal jugular vein, left brachiocephalic vein and SVC status post Port-A-Cath placement. These findings may contribute to symptoms of SVC syndrome. No  evidence of large vessel occlusion or intravascular thrombus. 3. No acute findings or evidence of metastatic disease in the abdomen or pelvis. 4. Mildly increased moderate right and small left pleural effusions with increased compressive atelectasis in the right middle lobe. There are additional increased patchy ground-glass opacities throughout both lungs which may reflect edema or posterior obstructive pneumonitis. 5. Stable moderate pericardial effusion. 6. Aortic Atherosclerosis (ICD10-I70.0) and Emphysema (ICD10-J43.9). Electronically Signed   By: Carey Bullocks M.D.   On: 03/06/2023 18:17   CT ABDOMEN PELVIS W CONTRAST  Result Date: 03/06/2023 CLINICAL DATA:  Small cell lung cancer. Increasing back pain and extreme fatigue. 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 * Tracking Code: BO * EXAM: CT CHEST, ABDOMEN, AND PELVIS WITH CONTRAST TECHNIQUE: Multidetector CT imaging of the chest, abdomen and pelvis was performed following the standard protocol during bolus administration of intravenous contrast. RADIATION DOSE REDUCTION: This exam was performed according to the departmental dose-optimization program which includes automated exposure control, adjustment of the mA and/or kV according to patient size and/or use of iterative reconstruction technique. CONTRAST:  OMNIPAQUE IOHEXOL 300 MG/ML  SOLN COMPARISON:  Chest CT 02/23/2023.  Abdominopelvic CT 02/27/2023. FINDINGS: CT CHEST FINDINGS Cardiovascular: A left subclavian Port-A-Cath has been placed, extending into the superior aspect of the right atrium. There is significant extrinsic compression of the right internal jugular, left brachiocephalic vein and SVC, similar to previous CT. There is also extrinsic mass effect on the right pulmonary artery and veins, also similar. No large vessel occlusion or intravascular thrombus identified. The heart size is stable. A moderate size pericardial effusion appears unchanged. Mediastinum/Nodes: Again demonstrated  is extensive confluent lymphadenopathy extending from the right supraclavicular region through the mediastinum into the right hilum. There is a right supraclavicular component which is incompletely visualized, measuring approximately 4.9 x 3.7 cm on image 1/2. There is a right paratracheal component measuring 4.8 x 4.6 cm, and this has a 3.4 x 2.7 cm component extending anterior to the brachiocephalic veins on images 18/2. Overall, these lymph nodes are similar in size to the prior chest CT, although demonstrate lower central density suggesting response to treatment and necrosis. No progressive adenopathy identified. Stable extrinsic narrowing of the central right-sided airways without occlusion. The esophagus appears unremarkable. Lungs/Pleura: New moderate right and small left pleural effusions (compared with prior chest CT; minimally enlarged compared with more recent abdominal CT). Associated increased compressive atelectasis in the right middle lobe. There are additional increased patchy ground-glass opacities throughout both lungs which may reflect edema or posterior obstructive pneumonitis. There is asymmetric central airway thickening on the right. Underlying mild to moderate centrilobular emphysema. No pneumothorax. Musculoskeletal/Chest wall: No chest wall mass or suspicious osseous findings. CT ABDOMEN AND PELVIS FINDINGS Hepatobiliary: The liver is normal in density without suspicious focal abnormality. Unchanged 9 mm low-density lesion in the left hepatic lobe on image 56/2, likely a cyst. No evidence of gallstones, gallbladder  wall thickening or biliary dilatation. Pancreas: Unremarkable. No pancreatic ductal dilatation or surrounding inflammatory changes. Spleen: Normal in size without focal abnormality. Adrenals/Urinary Tract: Both adrenal glands appear normal. No evidence of urinary tract calculus, suspicious renal lesion or hydronephrosis. The bladder appears normal for its degree of distention.  Stomach/Bowel: No enteric contrast administered. The stomach appears unremarkable for its degree of distension. No evidence of bowel wall thickening, distention or surrounding inflammatory change. Prominent stool throughout the colon. Vascular/Lymphatic: There are no enlarged abdominal or pelvic lymph nodes. Aortic and branch vessel atherosclerosis without evidence of aneurysm or large vessel occlusion. Reproductive: The uterus and ovaries appear normal. No adnexal mass. Other: No evidence of abdominal wall mass or hernia. No ascites or pneumoperitoneum. Musculoskeletal: No acute or significant osseous findings. IMPRESSION: 1. No significant change in size of extensive confluent lymphadenopathy extending from the right supraclavicular region through the mediastinum and right hilum. The lymph nodes demonstrate lower central density suggesting response to treatment and necrosis. 2. Grossly stable extrinsic compression of the right internal jugular vein, left brachiocephalic vein and SVC status post Port-A-Cath placement. These findings may contribute to symptoms of SVC syndrome. No evidence of large vessel occlusion or intravascular thrombus. 3. No acute findings or evidence of metastatic disease in the abdomen or pelvis. 4. Mildly increased moderate right and small left pleural effusions with increased compressive atelectasis in the right middle lobe. There are additional increased patchy ground-glass opacities throughout both lungs which may reflect edema or posterior obstructive pneumonitis. 5. Stable moderate pericardial effusion. 6. Aortic Atherosclerosis (ICD10-I70.0) and Emphysema (ICD10-J43.9). Electronically Signed   By: Carey Bullocks M.D.   On: 03/06/2023 18:17   DG Chest Portable 1 View  Result Date: 03/06/2023 CLINICAL DATA:  Fatigue and weakness.  On chemotherapy. EXAM: PORTABLE CHEST 1 VIEW COMPARISON:  Chest x-ray 02/28/2023 FINDINGS: Hyperinflation. Small right effusion with adjacent opacity  similar to previous. Stable interstitial prominence. Stable cardiopericardial silhouette. No pneumothorax. Again fullness of the right lung hilum. Left IJ chest port in place tip at the SVC right atrial junction. The port is accessed. IMPRESSION: Persistent right-sided effusion adjacent lung opacity with fullness of the right side of the lung hilum and widening of the mediastinum. Hyperinflation. Chest port Electronically Signed   By: Karen Kays M.D.   On: 03/06/2023 16:50   IR IMAGING GUIDED PORT INSERTION  Result Date: 03/02/2023 INDICATION: History of metastatic lung cancer. In need of durable intravenous access for the initiation of chemotherapy. EXAM: IMPLANTED PORT A CATH PLACEMENT WITH ULTRASOUND AND FLUOROSCOPIC GUIDANCE COMPARISON:  Neck CT-02/23/2023 MEDICATIONS: None ANESTHESIA/SEDATION: Moderate (conscious) sedation was employed during this procedure as administered by the Interventional Radiology RN. A total of Benadryl 25 mg and Fentanyl 25 mcg was administered intravenously. Moderate Sedation Time: 23 minutes. The patient's level of consciousness and vital signs were monitored continuously by radiology nursing throughout the procedure under my direct supervision. CONTRAST:  None FLUOROSCOPY TIME:  24 seconds (4 mGy) COMPLICATIONS: None immediate. PROCEDURE: The procedure, risks, benefits, and alternatives were explained to the patient. Questions regarding the procedure were encouraged and answered. The patient understands and consents to the procedure. Given presence of known right supraclavicular bulky lymphadenopathy, the decision was made to proceed with left internal jugular approach port a catheter placement. As such, the left neck and chest were prepped with chlorhexidine in a sterile fashion, and a sterile drape was applied covering the operative field. Maximum barrier sterile technique with sterile gowns and gloves were used for the procedure.  A timeout was performed prior to the  initiation of the procedure. Local anesthesia was provided with 1% lidocaine with epinephrine. After creating a small venotomy incision, a micropuncture kit was utilized to access the internal jugular vein. Real-time ultrasound guidance was utilized for vascular access including the acquisition of a permanent ultrasound image documenting patency of the accessed vessel. The microwire was utilized to measure appropriate catheter length. A subcutaneous port pocket was then created along the upper chest wall utilizing a combination of sharp and blunt dissection. The pocket was irrigated with sterile saline. A single lumen clear view power injectable port was chosen for placement. The 8 Fr catheter was tunneled from the port pocket site to the venotomy incision. The port was placed in the pocket. The external catheter was trimmed to appropriate length. At the venotomy, an 8 Fr peel-away sheath was placed over a guidewire under fluoroscopic guidance. The catheter was then placed through the sheath and the sheath was removed. Final catheter positioning was confirmed and documented with a fluoroscopic spot radiograph. The port was accessed with a Huber needle, aspirated and flushed with heparinized saline. The venotomy site was closed with an interrupted 4-0 Vicryl suture. The port pocket incision was closed with interrupted 2-0 Vicryl suture. Dermabond and Steri-strips were applied to both incisions. Dressings were applied. The patient tolerated the procedure well without immediate post procedural complication. FINDINGS: After catheter placement, the tip lies within the superior cavoatrial junction. The catheter aspirates and flushes normally and is ready for immediate use. IMPRESSION: Successful placement of a left internal jugular approach power injectable Port-A-Cath. The catheter is ready for immediate use. Electronically Signed   By: Simonne Come M.D.   On: 03/02/2023 15:05   CT BONE MARROW BIOPSY &  ASPIRATION  Result Date: 03/02/2023 INDICATION: History of metastatic lung cancer, now with thrombocytopenia. Please perform CT-guided bone marrow biopsy for tissue diagnostic purposes. EXAM: CT-GUIDED BONE MARROW BIOPSY AND ASPIRATION MEDICATIONS: None ANESTHESIA/SEDATION: Moderate (conscious) sedation was employed during this procedure as administered by the Interventional Radiology RN. A total of Benadryl 25 mg, Versed 1 mg and Fentanyl 50 mcg was administered intravenously. Moderate Sedation Time: 10 minutes. The patient's level of consciousness and vital signs were monitored continuously by radiology nursing throughout the procedure under my direct supervision. COMPLICATIONS: None immediate. PROCEDURE: Informed consent was obtained from the patient following an explanation of the procedure, risks, benefits and alternatives. The patient understands, agrees and consents for the procedure. All questions were addressed. A time out was performed prior to the initiation of the procedure. The patient was positioned prone and non-contrast localization CT was performed of the pelvis to demonstrate the iliac marrow spaces. The operative site was prepped and draped in the usual sterile fashion. Under sterile conditions and local anesthesia, a 22 gauge spinal needle was utilized for procedural planning. Next, an 11 gauge coaxial bone biopsy needle was advanced into the left iliac marrow space. Needle position was confirmed with CT imaging. Initially, a bone marrow aspiration was performed. Next, a bone marrow biopsy was obtained with the 11 gauge outer bone marrow device. The needle was removed and superficial hemostasis was obtained with manual compression. A dressing was applied. The patient tolerated the procedure well without immediate post procedural complication. IMPRESSION: Successful CT guided left iliac bone marrow aspiration and core biopsy. Electronically Signed   By: Simonne Come M.D.   On: 03/02/2023 15:03    IR US CHEST  Result Date: 02/28/2023 CLINICAL DATA:  Evaluate for  thoracentesis. EXAM: CHEST ULTRASOUND COMPARISON:  Chest radiograph 02/28/2023 FINDINGS: Small amount of right pleural fluid was identified. No percutaneous window for thoracentesis due to a flap of lung. Thoracentesis not performed. IMPRESSION: Small right pleural effusion. Thoracentesis not performed due to lack of a safe percutaneous window. Electronically Signed   By: Richarda Overlie M.D.   On: 02/28/2023 16:10   NM Bone Scan Whole Body  Result Date: 02/28/2023 CLINICAL DATA:  Lung cancer EXAM: NUCLEAR MEDICINE WHOLE BODY BONE SCAN TECHNIQUE: Whole body anterior and posterior images were obtained approximately 3 hours after intravenous injection of radiopharmaceutical. RADIOPHARMACEUTICALS:  21.0 mCi Technetium-39m MDP IV COMPARISON:  CT scan earlier June 2024 of multiple areas. FINDINGS: Physiologic distribution of radiotracer overall. There is some uptake involving the right midfoot consistent with known provided history of injury in the past. There is slight asymmetric uptake along the proximal left tibia from the other side. There is also some subtle areas of low uptake along the distal femoral metaphysis. Recommend dedicated x-rays. Mild areas of degenerative uptake such as along the extremities. Of note the kidneys are not as well seen as typical. Please correlate with clinical findings. IMPRESSION: Subtle asymmetric uptake involving the proximal left tibia. Slight low uptake along the distal femurs. Recommend dedicated x-rays if there is no known history. Atypical low uptake of the renal parenchyma. The rest of the findings do not suggest super scan but please correlate with the patient's renal function and other history. This may be technical. Electronically Signed   By: Karen Kays M.D.   On: 02/28/2023 14:40   DG CHEST PORT 1 VIEW  Result Date: 02/28/2023 CLINICAL DATA:  Pleural effusion on right. EXAM: PORTABLE CHEST 1 VIEW  COMPARISON:  Radiographs 02/27/2023 and 02/23/2023. Abdominal CT 02/27/2023 and chest CT 02/23/2023. FINDINGS: 0845 hours. Mild patient rotation to the right. Grossly stable heart size and mediastinal contours with known extensive mediastinal lymphadenopathy. There are enlarging right greater than left pleural effusions with increasing atelectasis at both lung bases. No pneumothorax. The bones appear unchanged. Telemetry leads overlie the chest. IMPRESSION: 1. Enlarging right greater than left pleural effusions with increasing bibasilar atelectasis. 2. Grossly stable mediastinal lymphadenopathy from recent chest CT, presumed lung cancer. Electronically Signed   By: Carey Bullocks M.D.   On: 02/28/2023 12:55   MR BRAIN W WO CONTRAST  Result Date: 02/28/2023 CLINICAL DATA:  Staging of small cell lung carcinoma EXAM: MRI HEAD WITHOUT AND WITH CONTRAST TECHNIQUE: Multiplanar, multiecho pulse sequences of the brain and surrounding structures were obtained without and with intravenous contrast. CONTRAST:  7mL GADAVIST GADOBUTROL 1 MMOL/ML IV SOLN COMPARISON:  None Available. FINDINGS: Brain: No acute infarct, mass effect or extra-axial collection. No acute or chronic hemorrhage. Normal white matter signal. On diffusion-weighted imaging and the FLAIR sequence, there are bilateral convexity signal abnormalities within the subdural space. The midline structures are normal. There is smooth, diffuse pachymeningeal contrast enhancement. There are no intraparenchymal contrast enhancing lesions. Vascular: Major flow voids are preserved. Skull and upper cervical spine: Normal calvarium and skull base. Visualized upper cervical spine and soft tissues are normal. Sinuses/Orbits:Mastoid effusions. Paranasal sinuses are clear. Normal orbits. IMPRESSION: 1. No intraparenchymal metastatic disease. 2. Bilateral convexity signal abnormalities within the subdural space, which may indicate aging subdural blood. 3. Smooth, diffuse  pachymeningeal contrast enhancement. 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 Metastatic disease felt less likely given the  lack nodularity. Electronically Signed   By: Deatra Robinson M.D.   On: 02/28/2023 02:17   DG CHEST PORT 1 VIEW  Result Date: 02/27/2023 CLINICAL DATA:  Shortness of breath. EXAM: PORTABLE CHEST 1 VIEW COMPARISON:  February 23, 2023. FINDINGS: Stable cardiomegaly. Mild central pulmonary vascular congestion is noted. Minimal bibasilar pulmonary edema is noted with small right pleural effusion. Bony thorax is unremarkable. IMPRESSION: Stable cardiomegaly with mild central pulmonary vascular congestion. Minimal bibasilar pulmonary edema is noted with probable small right pleural effusion. Electronically Signed   By: Lupita Raider M.D.   On: 02/27/2023 16:27   CT ABDOMEN PELVIS W CONTRAST  Result Date: 02/27/2023 CLINICAL DATA:  Non-small-cell lung cancer. Staging. * Tracking Code: BO * EXAM: CT ABDOMEN AND PELVIS WITH CONTRAST TECHNIQUE: Multidetector CT imaging of the abdomen and pelvis was performed using the standard protocol following bolus administration of intravenous contrast. RADIATION DOSE REDUCTION: This exam was performed according to the departmental dose-optimization program which includes automated exposure control, adjustment of the mA and/or kV according to patient size and/or use of iterative reconstruction technique. CONTRAST:  75mL OMNIPAQUE IOHEXOL 350 MG/ML SOLN COMPARISON:  Chest CTA 02/23/2023 FINDINGS: Lower chest: Bibasilar collapse/consolidation with bilateral pleural effusions, right greater than left. Pleural effusions are progressive since the chest CT 4 days ago. Hepatobiliary: Small area of low attenuation in the anterior liver, adjacent to the falciform ligament, is in a characteristic location for focal fatty deposition. 9 mm hypodensity in the  left liver (15/3) is too small to characterize but statistically is likely benign. There is no evidence for gallstones, gallbladder wall thickening, or pericholecystic fluid. No intrahepatic or extrahepatic biliary dilation. Pancreas: No focal mass lesion. No dilatation of the main duct. No intraparenchymal cyst. No peripancreatic edema. Spleen: No splenomegaly. No suspicious focal mass lesion. Adrenals/Urinary Tract: No adrenal nodule or mass. Kidneys unremarkable. No evidence for hydroureter. The urinary bladder appears normal for the degree of distention. Stomach/Bowel: Stomach is distended with food and fluid. Duodenum is normally positioned as is the ligament of Treitz. No small bowel wall thickening. No small bowel dilatation. The terminal ileum is normal. The appendix is normal. No gross colonic mass. No colonic wall thickening. Vascular/Lymphatic: Moderate atherosclerotic calcification is noted in the wall of the aorta. There is no gastrohepatic or hepatoduodenal ligament lymphadenopathy. No retroperitoneal or mesenteric lymphadenopathy. No pelvic sidewall lymphadenopathy. Reproductive: Unremarkable. Other: No intraperitoneal free fluid. Musculoskeletal: No worrisome lytic or sclerotic osseous abnormality. IMPRESSION: 1. No definite evidence for metastatic disease in the abdomen or pelvis. 2. 9 mm hypodensity in the left liver is too small to characterize but statistically is likely benign. Attention on follow-up recommended. 3. Bibasilar collapse/consolidation with bilateral pleural effusions, right greater than left. Pleural effusions are progressive since the chest CT 4 days ago. 4.  Aortic Atherosclerosis (ICD10-I70.0). Electronically Signed   By: Kennith Center M.D.   On: 02/27/2023 11:47   [place] CORE BIOPSY (SOFT TISSUE)  Result Date: 02/26/2023 INDICATION: Cervical and supraclavicular adenopathy.  No diagnosis. EXAM: ULTRASOUND-GUIDED RIGHT SUPRACLAVICULAR NODAL MASS BIOPSY COMPARISON:  CT neck and  chest, 09/24/2022. MEDICATIONS: None ANESTHESIA/SEDATION: Local anesthetic was administered. COMPLICATIONS: None immediate. TECHNIQUE: Informed written consent was obtained from the patient and/or patient's representative after a discussion of the risks, benefits and alternatives to treatment. Questions regarding the procedure were encouraged and answered. Initial ultrasound scanning demonstrated RIGHT supraclavicular nodal mass. An ultrasound image was saved for documentation purposes. The procedure was planned. A timeout was performed prior to  the initiation of the procedure. The operative was prepped and draped in the usual sterile fashion, and a sterile drape was applied covering the operative field. A timeout was performed prior to the initiation of the procedure. Local anesthesia was provided with 1% lidocaine with epinephrine. Under direct ultrasound guidance, an 18 gauge core needle device was utilized to obtain to obtain 3 core needle biopsies of the RIGHT supraclavicular nodal mass. The samples were placed in saline and submitted to pathology. The needle was removed and superficial hemostasis was achieved with manual compression. Post procedure scan was negative for significant hematoma. A dressing was applied. The patient tolerated the procedure well without immediate postprocedural complication. IMPRESSION: Successful core biopsy of RIGHT supraclavicular nodal mass. Roanna Banning, MD [facility] Radiology Electronically Signed   By: Roanna Banning M.D.   On: 02/26/2023 10:15   ECHOCARDIOGRAM COMPLETE  Result Date: 02/24/2023    ECHOCARDIOGRAM REPORT   Patient Name:   SHAWNTELLE KADOW Date of Exam: 02/24/2023 Medical Rec #:  [id number]         Height:       68.0 in Accession #:    [id number]        Weight:       157.0 lb Date of Birth:  02-05-1971        BSA:          1.844 m Patient Age:    51 years          BP:           111/73 mmHg Patient Gender: F                  HR:           88 bpm. Exam Location:  Inpatient Procedure: 2D Echo, Cardiac Doppler and Color Doppler Indications:    A-FIB  History:        Patient has no prior history of Echocardiogram examinations.                 Arrythmias:Atrial Fibrillation; Risk Factors:Current Smoker.  Sonographer:    Darlys Gales Referring Phys: [phone] JACOB J STINSON IMPRESSIONS  1. Left ventricular ejection fraction, by estimation, is 60 to 65%. The left ventricle has normal function. The left ventricle has no regional wall motion abnormalities. Left ventricular diastolic parameters are indeterminate.  2. Right ventricular systolic function is normal. The right ventricular size is normal. Tricuspid regurgitation signal is inadequate for assessing PA pressure.  3. The mitral valve is normal in structure. No evidence of mitral valve regurgitation.  4. The aortic valve is grossly normal. Aortic valve regurgitation is not visualized.  5. The inferior vena cava is dilated in size with <50% respiratory variability, suggesting right atrial pressure of 15 mmHg. FINDINGS  Left Ventricle: Left ventricular ejection fraction, by estimation, is 60 to 65%. The left ventricle has normal function. The left ventricle has no regional wall motion abnormalities. The left ventricular internal cavity size was normal in size. There is  no left ventricular hypertrophy. Left ventricular diastolic parameters are indeterminate. Right Ventricle: The right ventricular size is normal. Right ventricular systolic function is normal. Tricuspid regurgitation signal is inadequate for assessing PA pressure. Left Atrium: Left atrial size was normal in size. Right Atrium: Right atrial size was normal in size. Pericardium: There is no evidence of pericardial effusion. Mitral Valve: The mitral valve is normal in structure. No evidence of mitral valve regurgitation. Tricuspid Valve: Tricuspid valve  regurgitation is not demonstrated. Aortic Valve: The aortic valve is  grossly normal. Aortic valve regurgitation is not visualized. Aortic valve mean gradient measures 5.0 mmHg. Aortic valve peak gradient measures 7.6 mmHg. Aortic valve area, by VTI measures 2.82 cm. Pulmonic Valve: Pulmonic valve regurgitation is not visualized. Aorta: The aortic root and ascending aorta are structurally normal, with no evidence of dilitation. Venous: The inferior vena cava is dilated in size with less than 50% respiratory variability, suggesting right atrial pressure of 15 mmHg. IAS/Shunts: The interatrial septum was not well visualized.  LEFT VENTRICLE PLAX 2D LVIDd:         5.10 cm   Diastology LVIDs:         3.00 cm   LV e' medial:    7.07 cm/s LV PW:         0.80 cm   LV E/e' medial:  12.3 LV IVS:        0.70 cm   LV e' lateral:   10.20 cm/s LVOT diam:     1.80 cm   LV E/e' lateral: 8.5 LV SV:         76 LV SV Index:   41 LVOT Area:     2.54 cm  RIGHT VENTRICLE             IVC RV S prime:     15.10 cm/s  IVC diam: 2.50 cm TAPSE (M-mode): 2.5 cm LEFT ATRIUM             Index        RIGHT ATRIUM           Index LA Vol (A2C):   21.7 ml 11.77 ml/m  RA Area:     14.00 cm LA Vol (A4C):   27.3 ml 14.81 ml/m  RA Volume:   34.60 ml  18.76 ml/m LA Biplane Vol: 24.7 ml 13.40 ml/m  AORTIC VALVE AV Area (Vmax):    2.42 cm AV Area (Vmean):   2.26 cm AV Area (VTI):     2.82 cm AV Vmax:           138.00 cm/s AV Vmean:          102.000 cm/s AV VTI:            0.269 m AV Peak Grad:      7.6 mmHg AV Mean Grad:      5.0 mmHg LVOT Vmax:         131.00 cm/s LVOT Vmean:        90.500 cm/s LVOT VTI:          0.298 m LVOT/AV VTI ratio: 1.11  AORTA Ao Root diam: 3.20 cm Ao Asc diam:  3.50 cm MITRAL VALVE MV Area (PHT): 5.23 cm     SHUNTS MV Decel Time: 145 msec     Systemic VTI:  0.30 m MV E velocity: 87.00 cm/s   Systemic Diam: 1.80 cm MV A velocity: 102.00 cm/s MV E/A ratio:  0.85 Photographer signed by Carolan Clines Signature Date/Time: 02/24/2023/3:44:27 PM    Final    CT Angio Chest PE W  and/or Wo Contrast  Result Date: 02/23/2023 CLINICAL DATA:  Shortness of breath, right neck mass, chest mass, headache * Tracking Code: BO * EXAM: CT ANGIOGRAPHY CHEST WITH CONTRAST TECHNIQUE: Multidetector CT imaging of the chest was performed using the standard protocol during bolus administration of intravenous contrast. Multiplanar CT image reconstructions and MIPs were obtained to evaluate the vascular anatomy.  RADIATION DOSE REDUCTION: This exam was performed according to the departmental dose-optimization program which includes automated exposure control, adjustment of the mA and/or kV according to patient size and/or use of iterative reconstruction technique. CONTRAST:  75mL OMNIPAQUE IOHEXOL 350 MG/ML SOLN COMPARISON:  None Available. FINDINGS: Cardiovascular: No filling defect is identified in the pulmonary arterial tree to suggest pulmonary embolus. Mild atheromatous vascular calcification of the aortic arch. The large mediastinal mass bulges into the SVC posteriorly for example on image 128 series 4, probably from extrinsic compression, definite invasion is not observed. Because of lack of complete opacification from pulmonary arterial timing, patency of the left brachiocephalic vein is uncertain. Moderate pericardial effusion. Mediastinum/Nodes: Large right infrahilar and hilar mass extending into the mediastinum and tracking all the way to the thoracic inlet. This extends around the right mainstem bronchus, right upper lobe bronchus, and bronchus intermedius as well as the right lower lobe and right middle lobe bronchi, and likewise surrounds and narrows the right pulmonary artery and its branches. In the subcarinal region, the conglomerate mass measures about 7.9 by 5.6 cm (image 144, series 4) and the mass flattens and narrows the SVC without overt occlusion. In the right infrahilar region, the observed mass measures 4.7 by 3.9 cm on image 179 series 4. Presumed lymph node anterior to the SVC in  the upper chest measures 2.6 cm in short axis on image 36 series 3. A right lower neck level IV mass/lymph node measures 3.4 cm in short axis on image 10 series 3. Lungs/Pleura: The mass in the right chest displaces the upper trachea to the left. There is substantial narrowing of the right upper lobe bronchus, right middle lobe bronchus, and severe narrowing of the right lower lobe bronchus due to the surrounding mass. Patchy regions of nodularity in the right lower lobe distal to the mass may represent postobstructive pneumonitis or satellite tumor nodules. Centrilobular emphysema. Scarring or atelectasis in the right middle lobe and right upper lobe anteriorly. Mild dependent atelectasis in both lower lobes. Trace right pleural effusion, image 50 series 3. Upper Abdomen: Small nonspecific hypodense lesion in the left hepatic lobe 0.9 cm in long axis on image 96 series 3. Musculoskeletal: Unremarkable Review of the MIP images confirms the above findings. IMPRESSION: 1. Large right infrahilar and hilar mass extending into the mediastinum and tracking all the way to the thoracic inlet. This extends around the right mainstem bronchus, right upper lobe bronchus, and bronchus intermedius as well as the right lower lobe and right middle lobe bronchi, and likewise surrounds and narrows the right pulmonary artery and its branches. The mass flattens and narrows the SVC without overt occlusion, and is confluent with bulky right level IV neck adenopathy. Top differential diagnostic considerations include lung cancer (such as squamous cell or small cell) versus extensive thoracic and right lower neck lymphoma. 2. Patchy regions of nodularity in the right lower lobe distal to the mass may represent postobstructive pneumonitis or satellite tumor nodules. 3. Presumed lymph node anterior to the SVC in the upper chest measures 2.6 cm in short axis. 4. A right lower neck level IV mass/lymph node measures 3.4 cm in short axis,  compatible with malignancy. 5. Moderate pericardial effusion. 6. Trace right pleural effusion. 7. Centrilobular emphysema. 8. Small nonspecific hypodense lesion in the left hepatic lobe 0.9 cm in long axis. 9. No pulmonary embolus is identified. 10. Aortic atherosclerosis. Aortic Atherosclerosis (ICD10-I70.0) and Emphysema (ICD10-J43.9). Electronically Signed   By: Annitta Needs.D.  On: 02/23/2023 13:39   CT Soft Tissue Neck W Contrast  Result Date: 02/23/2023 CLINICAL DATA:  Neck mass, nonpulsatile right sided neck mass EXAM: CT NECK WITH CONTRAST TECHNIQUE: Multidetector CT imaging of the neck was performed using the standard protocol following the bolus administration of intravenous contrast. RADIATION DOSE REDUCTION: This exam was performed according to the departmental dose-optimization program which includes automated exposure control, adjustment of the mA and/or kV according to patient size and/or use of iterative reconstruction technique. CONTRAST:  75mL OMNIPAQUE IOHEXOL 350 MG/ML SOLN COMPARISON:  None Available. FINDINGS: Pharynx and larynx: Normal. No mass or swelling. Salivary glands: No inflammation, mass, or stone. Thyroid: Normal. Lymph nodes: There is bulky lower cervical and mediastinal lymphadenopathy involving the right level 3 and level 4 lymph node stations, the right supraclavicular region, and the anterior and posterior mediastinum. Cervical lymphadenopathy significant mass effect on the right internal jugular vein (series 5, image 68). Vascular: Negative. Mass effect on the right internal jugular vein, as above Limited intracranial: Negative. Visualized orbits: Negative. Mastoids and visualized paranasal sinuses: Moderate left-sided mastoid effusion. There is pneumatization of the petrous apices with air-fluid levels. Paranasal sinuses are clear. Orbits are unremarkable. Skeleton: No acute or aggressive process. Upper chest: Mild paraseptal emphysema. See same day CT chest for  additional findings Other: None. IMPRESSION: 1. Bulky right lower cervical and mediastinal lymphadenopathy, concerning for metastatic disease or lymphoma. 2. See separate CT chest for additional findings. Emphysema (ICD10-J43.9). Electronically Signed   By: Lorenza Cambridge M.D.   On: 02/23/2023 13:17   CT Head Wo Contrast  Result Date: 02/23/2023 CLINICAL DATA:  Headache EXAM: CT HEAD WITHOUT CONTRAST TECHNIQUE: Contiguous axial images were obtained from the base of the skull through the vertex without intravenous contrast. RADIATION DOSE REDUCTION: This exam was performed according to the departmental dose-optimization program which includes automated exposure control, adjustment of the mA and/or kV according to patient size and/or use of iterative reconstruction technique. COMPARISON:  None Available. FINDINGS: Brain: There is no acute intracranial hemorrhage, extra-axial fluid collection, or acute infarct Parenchymal volume is normal. The ventricles are normal in size. Gray-white differentiation is preserved The pituitary and suprasellar region are normal. There is no mass lesion. There is no mass effect or midline shift. Vascular: No hyperdense vessel or unexpected calcification. Skull: Normal. Negative for fracture or focal lesion. Sinuses/Orbits: The imaged paranasal sinuses are clear. The globes and orbits are unremarkable. Other: There are small bilateral mastoid effusions. IMPRESSION: 1. No acute intracranial pathology. 2. Small bilateral mastoid effusions. Electronically Signed   By: Lesia Hausen M.D.   On: 02/23/2023 13:15   DG Chest Port 1 View  Result Date: 02/23/2023 CLINICAL DATA:  Sepsis EXAM: PORTABLE CHEST 1 VIEW COMPARISON:  X-ray 01/09/2023 FINDINGS: No pneumothorax, effusion or edema. Calcified aorta. Overlapping cardiac leads. There is new widening of the mediastinum compared to prior x-ray and fullness of the right lung hilum. Recommend further evaluation with CT to delineate for  potential mass. IMPRESSION: New widening of the mediastinum with masslike fullness in the right lung hilum. Recommend follow up contrast CT to further delineate Electronically Signed   By: Karen Kays M.D.   On: 02/23/2023 13:11

## 2023-03-13 NOTE — Progress Notes (Signed)
Chaplain worked to honor consult for an Scientist, water quality, Healthcare POA with [employer]. Through talking with medical team and assessing [place], Chaplain recognized that it may be difficult to complete Healthcare POA documents and that it is not a necessity at this time. Patsey showcased some confusion as she did not know she is DNR. She also stated, "You all are treating me like I'm dying." Chaplain recognized that Jadasia may be having a hard time remembering or processing significant medical conversations.   Mac vocalized that she does have a twin sister and a niece that she wants involved in her medical decision making if she loses capacity, as well as her father. Chaplain did affirm that because they are her next-of-kin they can work together to make decisions if she is unable to do so for herself. Marquasha affirmed that her father, sister, and niece are in good relationship with each other and converse. Chaplain worked to [employer] and comfort, and to acknowledge that by law and naturally her family would be involved. Madellyn, however, is adamant that her step-mom NOT participate in any decision making or conversations. Chaplain and Dr. Patterson Hammersmith offered assurance regarding that request while acknowledging that the medical team cannot stop her father from speaking with his wife about Finesse's care.   Chaplain offered supportive and compassionate presence, as well as education and support.     03/13/23 1200  Spiritual Encounters  Type of Visit Initial  Reason for visit Advance directives

## 2023-03-13 NOTE — Progress Notes (Signed)
PT Cancellation Note  Patient Details Name: Ana Curry MRN: [id number] DOB: [birth year]   Cancelled Treatment:    Reason Eval/Treat Not Completed: Medical issues which prohibited therapy Blanchard Kelch PT Acute Rehabilitation Services Office [phone] Weekend [phone]   Rada Hay 03/13/2023, 8:24 AM

## 2023-03-14 ENCOUNTER — Inpatient Hospital Stay (HOSPITAL_COMMUNITY): Payer: Medicaid Other

## 2023-03-14 ENCOUNTER — Ambulatory Visit: Payer: No Typology Code available for payment source | Admitting: Hematology

## 2023-03-14 ENCOUNTER — Other Ambulatory Visit: Payer: No Typology Code available for payment source

## 2023-03-14 ENCOUNTER — Ambulatory Visit: Payer: Medicaid Other

## 2023-03-14 ENCOUNTER — Encounter (HOSPITAL_COMMUNITY): Payer: Self-pay | Admitting: Hematology

## 2023-03-14 ENCOUNTER — Other Ambulatory Visit: Payer: Self-pay

## 2023-03-14 DIAGNOSIS — J9601 Acute respiratory failure with hypoxia: Secondary | ICD-10-CM | POA: Diagnosis not present

## 2023-03-14 DIAGNOSIS — I3139 Other pericardial effusion (noninflammatory): Secondary | ICD-10-CM | POA: Diagnosis not present

## 2023-03-14 DIAGNOSIS — R4589 Other symptoms and signs involving emotional state: Secondary | ICD-10-CM

## 2023-03-14 DIAGNOSIS — Z7189 Other specified counseling: Secondary | ICD-10-CM

## 2023-03-14 LAB — CBC WITH DIFFERENTIAL/PLATELET
Abs Immature Granulocytes: 0.09 10*3/uL — ABNORMAL HIGH (ref 0.00–0.07)
Basophils Absolute: 0 10*3/uL (ref 0.0–0.1)
Basophils Relative: 1 %
Eosinophils Absolute: 0 10*3/uL (ref 0.0–0.5)
Eosinophils Relative: 0 %
HCT: 20.6 % — ABNORMAL LOW (ref 36.0–46.0)
Hemoglobin: 6.7 g/dL — CL (ref 12.0–15.0)
Immature Granulocytes: 5 %
Lymphocytes Relative: 42 %
Lymphs Abs: 0.8 10*3/uL (ref 0.7–4.0)
MCH: 31.9 pg (ref 26.0–34.0)
MCHC: 32.5 g/dL (ref 30.0–36.0)
MCV: 98.1 fL (ref 80.0–100.0)
Monocytes Absolute: 0.1 10*3/uL (ref 0.1–1.0)
Monocytes Relative: 7 %
Neutro Abs: 0.8 10*3/uL — ABNORMAL LOW (ref 1.7–7.7)
Neutrophils Relative %: 45 %
Platelets: 12 10*3/uL — CL (ref 150–400)
RBC: 2.1 MIL/uL — ABNORMAL LOW (ref 3.87–5.11)
RDW: 15 % (ref 11.5–15.5)
WBC: 1.8 10*3/uL — ABNORMAL LOW (ref 4.0–10.5)
nRBC: 1.7 % — ABNORMAL HIGH (ref 0.0–0.2)

## 2023-03-14 LAB — ECHOCARDIOGRAM LIMITED
Calc EF: 55.4 %
Height: 68 in
S' Lateral: 3.7 cm
Single Plane A2C EF: 44.7 %
Single Plane A4C EF: 60.3 %
Weight: 2860.69 oz

## 2023-03-14 LAB — BASIC METABOLIC PANEL
Anion gap: 8 (ref 5–15)
BUN: 15 mg/dL (ref 6–20)
CO2: 28 mmol/L (ref 22–32)
Calcium: 8.4 mg/dL — ABNORMAL LOW (ref 8.9–10.3)
Chloride: 97 mmol/L — ABNORMAL LOW (ref 98–111)
Creatinine, Ser: 0.65 mg/dL (ref 0.44–1.00)
GFR, Estimated: >60 mL/min (ref >60)
Glucose, Bld: 100 mg/dL — ABNORMAL HIGH (ref 70–99)
Potassium: 4.1 mmol/L (ref 3.5–5.1)
Sodium: 133 mmol/L — ABNORMAL LOW (ref 135–145)

## 2023-03-14 LAB — HEMOGLOBIN AND HEMATOCRIT, BLOOD
HCT: 22.4 % — ABNORMAL LOW (ref 36.0–46.0)
Hemoglobin: 7.5 g/dL — ABNORMAL LOW (ref 12.0–15.0)

## 2023-03-14 LAB — PREPARE RBC (CROSSMATCH)

## 2023-03-14 LAB — TYPE AND SCREEN: Unit division: 0

## 2023-03-14 LAB — BPAM RBC

## 2023-03-14 MED ORDER — LORAZEPAM 2 MG/ML IJ SOLN
1.0000 mg | INTRAMUSCULAR | Status: DC | PRN
  Administered 2023-03-14 – 2023-03-15 (×3): 2 mg via INTRAVENOUS
  Filled 2023-03-14 (×4): qty 1

## 2023-03-14 MED ORDER — DIAZEPAM 5 MG PO TABS
5.0000 mg | ORAL_TABLET | Freq: Two times a day (BID) | ORAL | Status: DC | PRN
  Administered 2023-03-14: 5 mg via ORAL
  Filled 2023-03-14: qty 1

## 2023-03-14 MED ORDER — SODIUM CHLORIDE 0.9% IV SOLUTION: Freq: Once | INTRAVENOUS | Status: AC

## 2023-03-14 MED ORDER — DIAZEPAM 5 MG PO TABS
5.0000 mg | ORAL_TABLET | Freq: Once | ORAL | Status: AC
  Administered 2023-03-14: 5 mg via ORAL
  Filled 2023-03-14: qty 1

## 2023-03-14 NOTE — Progress Notes (Signed)
PT Cancellation Note  Patient Details Name: Ana Curry MRN: [id number] DOB: Feb 07, 1971   Cancelled Treatment:    Reason Eval/Treat Not Completed: Medical issues which prohibited therapy  Blanchard Kelch PT Acute Rehabilitation Services Office [phone] Weekend [phone]  Rada Hay 03/14/2023, 1:09 PM

## 2023-03-14 NOTE — Progress Notes (Signed)
Daily Progress Note   Patient Name: Ana Curry       Date: 03/14/2023 DOB: [birth year]  Age: 52 y.o. MRN#: [id number] Attending Physician: Salomon Mast* Primary Care Physician: [facility] Admit Date: 03/06/2023 Length of Stay: 8 days  Reason for Consultation/Follow-up: Establishing goals of care and symptom management  Subjective:   CC: Patient still having pain though improved with PCA.  Following up regarding complex medical decision making and symptom management.   Subjective:  Discussed care with bedside RN prior to presenting to bedside. Patient's breathing status overall worsening and requiring HFNC 15L/min.  When presenting to bedside in the morning, patient appears pale and tired. Work of breathing decreased overall. Family present at bedside visiting.  Patient easily awakened and agreeing to discuss pain management at that time. Patient feels the PCA is keeping her pain from being a 10/10 though pain is still present. Discussed possibly changing dosing on PCA though patient did not want to make any adjustments at this time.   Informed patient that I would reach out to IDT to determine next steps of care. Patient has been unable to receive radiation for multiple reasons, including thrombocytopenia. Expressed that patient's body is very weak and so if she cannot receive radiation to assist with management, we may need to reevaluate what the medical pathway will be moving forward. Patient acknowledged and noted appreciation for continued discussions.   Discussed care with IDT after visit to coordinate care. Patient is not a candidate for further radiation due to thrombocytopenia. Patient is not a candidate for cancer directed therapies as she is too ill.   Presented to bedside later in afternoon after input from IDT group. When presenting to bedside, patient laying in bed with increased work of breathing. Asked permission to engage in serious  conversation which patient agreed with. Patient appropriately interactive and engaging in conversation.  Patient's father and aunt were at bedside and she agreed with him being present for this conversation. Spent time discussing patient's cancer diagnosis.  Explained that based on follow-up with other providers, patient is not an appropriate candidate for radiation therapy due to her bone marrow suppression with thrombocytopenia.  Patient is too ill to undergo further cancer directed therapies.  Spent time answering questions regarding this as able.  Explained at this time, most appropriate action would be to transition to comfort focused care to allow patient quality time at the end of her life.  Patient appropriately tearful during conversation.  Spent time providing emotional support via active listening.  Discussed at this time we will continue with current medical interventions and allow patient and family time to process this information.  Patient inquiring about her prognosis and expressed concern that it could be weeks or shorter based on her worsening respiratory status and pancytopenia. All questions answered as able at that time. Noted PMT would continue to follow along with patient's medical journey and support discussions and symptom management going forward.  Patient's father stepped outside of room to speak with this provider privately.  Patient's father appropriately tearful.  Patient's father acknowledged that he is not surprised by the information we discussed as this is how patient's mother died as well.  He inquired about patient's care at the end of life.  Discussed currently patient is requiring high respiratory support and so will remain in the hospital.  Acknowledge that should we continue and be able to transition to comfort focused care, may need to consider inpatient hospice for end-of-life care.  All questions answered at that time.  Noted taking things a day at a time.  Updated  entire IDT team including RN, oncology, radiation, and hospitalist regarding discussion had with patient and her family.  Review of Systems pain Objective:   Vital Signs:  BP 116/64   Pulse (!) 142   Temp 98.2 F (36.8 C) (Oral)   Resp (!) 27   Ht 5\' 8"  (1.727 m)   Wt 81.1 kg   LMP 01/22/2017   SpO2 94%   BMI 27.19 kg/m   Physical Exam: General: alert, awake, chronically ill appearing, pale Eyes: No drainage noted HENT: Dry mucous membranes Cardiovascular: Tachycardia noted Respiratory: increased work of breathing noted Abdomen: not distended Extremities: Moving all appropriately Skin: no rashes or lesions on visible skin Neuro: A&Ox4, following commands easily Psych: appropriately answers all questions  Imaging:  I personally reviewed recent imaging.   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  Patient had recently been hospitalized from 6/14-24 for worsening shortness of breath of the past 2 months and was found to have SVC syndrome.  Since admission, patient has received management for acute hypoxic respiratory failure due to to her extensive SCLC.  Bone marrow biopsy has shown metastatic disease as well.  Continuing radiation while admitted.  Oncology following with patient's care as well.  Palliative medicine team consulted to assist with complex medical decision making and symptom management.  Recommendations/Plan: # Complex medical decision making/goals of care:  - Discussed care with patient and family as detailed above in HPI. Patient's overall medical status is rapidly deteriorating in the setting of her cancer. As per discussion with IDT (oncology, rad onc) patient is too ill for further cancer directed  therapies including radiation. At this time, most appropraite to transition to comfort focused care. Informed patient and her father and aunt of this in the afternoon. Recommended transition to comfort care. Patient appropraite grieving this information at this time so allowing time to process. Will continue current level of care and noted this provider would follow up with patient tomorrow to further discuss possible transition for comfort care.   -Patient has clearly stated that if she was unable to make medical decisions for herself, her father would make medical decisions on her behalf.  Patient does not have any children.  Patient does NOT want her stepmother involved in medical conversations.  -  Code Status: DNR  # Symptom management:  -Dyspnea, in the setting of metastatic SCLC   -Continue IV dilaudid PCA to 0.5mg  q50min bolus. Continue IV dilaudid basal rate at 0.3mg /hr.    -Anxiety Patient's anxiety is her primary concern. Transition to valium has not assisted with management. Will return to using ativan prn for management.    -Start IV Ativan 1-2mg  q4hrs prn.   # Psychosocial Support:  -Father, aunt  # Discharge Planning: To Be Determined  Discussed with: patient, bedside RN, hospitalist, IR, rad onc, oncology   Thank you for allowing the palliative care team to participate in the care [facility].  Alvester Morin, DO Palliative Care Provider PMT # [phone]  If patient remains symptomatic despite maximum doses, please call PMT at [phone] between 0700 and 1900. Outside of these hours, please call attending, as PMT does not have night coverage.  This provider spent a total of 65 minutes providing patient's care.  Includes review of EMR, discussing care with other staff members involved in patient's medical care, obtaining relevant history and information from patient and/or patient's family, and personal review of imaging and lab work. Greater than 50% of the time was  spent counseling and coordinating care related to the above assessment and plan.    *Please note that this is a verbal dictation therefore any spelling or grammatical errors are due to the "[employer]" system interpretation.

## 2023-03-14 NOTE — Progress Notes (Signed)
PROGRESS NOTE    Ana Curry  ZOX:[id number]  DOB: 1971-04-13  DOA: 03/06/2023 PCP: [facility] Specialists:   Hospital course:  52 year old female with recent diagnosis of metastatic small cell cancer was admitted with acute hypoxic respiratory failure, SVC syndrome with hypotension and bilateral pleural effusions.  Patient has been seen by oncology and palliative care.   Subjective:  Patient's main concern today is anxiety.  Notes that she is usually on Valium at home but does not take it all the time.  I noted that she is already on Valium 10 mg twice daily.  She states she did not know that.  However notes that she still anxious.   Objective: Vitals:   03/14/23 1200 03/14/23 1210 03/14/23 1237 03/14/23 1300  BP:    116/64  Pulse: (!) 103   (!) 142  Resp: 20  (!) 24 (!) 27  Temp:  98.2 F (36.8 C)    TempSrc:  Oral    SpO2: 100%  100% 94%  Weight:      Height:        Intake/Output Summary (Last 24 hours) at 03/14/2023 1611 Last data filed at 03/14/2023 1247 Gross per 24 hour  Intake 1016.93 ml  Output 1750 ml  Net -733.07 ml   Filed Weights   03/06/23 2315 03/09/23 0500 03/13/23 0500  Weight: 77.1 kg 78.2 kg 81.1 kg     Exam:  General: Patient lying in bed looking acutely uncomfortable and anxious holding herself rather still Eyes: sclera anicteric, conjuctiva mild injection bilaterally CVS: S1-S2, regular  Respiratory:  decreased air entry bilaterally secondary to decreased inspiratory effort, rales at bases  GI: NABS, soft, NT  LE: Warm and well-perfused Neuro: A/O x 3 Psych: Tearful, anxious and distressed  Data Reviewed:  Basic Metabolic Panel: Recent Labs  Lab 03/08/23 0323 03/09/23 0341 03/12/23 1910 03/13/23 0340 03/14/23 0547  NA 134* 132* 132* 131* 133*  K 3.7 3.8 3.8 3.8 4.1  CL 98 96* 97* 97* 97*  CO2 27 25 25 25 28   GLUCOSE 109* 108* 159* 116* 100*  BUN 16 15 21* 18 15  CREATININE 0.93 0.82 0.73  0.72 0.65  CALCIUM 9.0 9.3 8.5* 8.4* 8.4*  MG  --   --  2.4  --   --   PHOS  --   --  3.6  --   --     CBC: Recent Labs  Lab 03/10/23 0340 03/11/23 0250 03/12/23 0754 03/12/23 1910 03/13/23 0340 03/14/23 0547 03/14/23 1440  WBC 1.7* 1.8* 1.3* 1.3* 1.3* 1.8*  --   NEUTROABS 0.9* 0.9* 0.5*  --  0.2* 0.8*  --   HGB 8.2* 7.4* 7.0* 6.3* 7.3* 6.7* 7.5*  HCT 24.9* 22.2* 21.2* 19.5* 22.2* 20.6* 22.4*  MCV 96.1 96.9 97.7 97.5 97.4 98.1  --   PLT 29* 20* 15* 13* 13* 12*  --      Scheduled Meds:  amiodarone  200 mg Oral BID   Chlorhexidine Gluconate Cloth  6 each Topical Daily   HYDROmorphone   Intravenous Q4H   ipratropium-albuterol  3 mL Nebulization Q6H   lactulose  10 g Oral TID   magnesium hydroxide  30 mL Oral BID   nicotine  14 mg Transdermal Daily   polyethylene glycol  17 g Oral BID   senna-docusate  1 tablet Oral BID   sodium chloride flush  3 mL Intravenous Q12H   Tbo-Filgrastim  300 mcg Subcutaneous q1800   Continuous  Infusions:  sodium chloride 30 mL/hr at 03/14/23 1143     Assessment & Plan:   Extensive stage small cell cancer s/p one course of treatment Pancytopenia SVC syndrome Acute hypoxic respiratory failure Bilateral pleural effusion Plan is for radiation for SVC syndrome however there has been conversation back-and-forth about whether she would be a candidate for this and whether she would be able to tolerate this. 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 every 12 hours as needed in addition to her Valium 10 mg twice daily she is already on Being followed by palliative  care  Goals for care Very much appreciate ongoing discussions with patient and family by palliative care consult Apparently patient's mother also died of small cell lung cancer Patient is presently DNR Ongoing discussions as we explore what therapeutic options are available to her  H/O PAF Moderate pericardial effusion Not in tamponade at present Echocardiogram is ordered and pending On admission patient developed A-fib with RVR and is continued on amiodarone No anticoagulation given thrombocytopenia     DVT prophylaxis: SCD due to thrombocytopenia Code Status: DNR Family Communication: Patient's aunt and father were at bedside throughout     Studies: ECHOCARDIOGRAM LIMITED  Result Date: 03/14/2023    ECHOCARDIOGRAM LIMITED REPORT   Patient Name:   Ana Curry Date of Exam: 03/14/2023 Medical Rec #:  [id number]         Height:       68.0 in Accession #:    [id number]        Weight:       178.8 lb Date of Birth:  Jan 25, 1971        BSA:          1.949 m Patient Age:    51 years          BP:           110/70 mmHg Patient Gender: F                 HR:           112 bpm. Exam Location:  Inpatient Procedure: Limited Echo, Cardiac Doppler and Color Doppler Indications:    I31.3 Pericardial effusion (noninflammatory)  History:        Patient has prior history of Echocardiogram examinations, most                 recent 02/24/2023. Abnormal ECG, COPD, Arrythmias:Atrial                 Fibrillation and Tachycardia; Signs/Symptoms:Dyspnea and                 Shortness of Breath. Lung cancer. Pericardial effusion.  Sonographer:    Sheralyn Boatman RDCS Referring Phys: [id number] [facility]  Sonographer Comments: Technically difficult study due to poor echo windows. Patient lying in right decubitus position. IMPRESSIONS  1. Left ventricular ejection fraction, by estimation, is 55 to 60%. The left ventricle has normal function.  2. Right ventricular systolic function is mildly reduced. The right ventricular  size is normal. There is moderately elevated pulmonary artery systolic pressure. The  estimated right ventricular systolic pressure is 47.8 mmHg.  3. A small pericardial effusion is present. The pericardial effusion is localized near the right atrium and anterior to the right ventricle. There is no evidence of cardiac tamponade.  4. The inferior vena cava is dilated in size with <50% respiratory variability, suggesting right atrial pressure of 15 mmHg. Comparison(s): No significant change from prior study. FINDINGS  Left Ventricle: Left ventricular ejection fraction, by estimation, is 55 to 60%. The left ventricle has normal function. Right Ventricle: The right ventricular size is normal. Right ventricular systolic function is mildly reduced. There is moderately elevated pulmonary artery systolic pressure. The tricuspid regurgitant velocity is 2.86 m/s, and with an assumed right atrial pressure of 15 mmHg, the estimated right ventricular systolic pressure is 47.8 mmHg. Pericardium: A small pericardial effusion is present. The pericardial effusion is localized near the right atrium and anterior to the right ventricle. There is no evidence of cardiac tamponade. Tricuspid Valve: The tricuspid valve is normal in structure. Tricuspid valve regurgitation is mild . No evidence of tricuspid stenosis. Venous: The inferior vena cava is dilated in size with less than 50% respiratory variability, suggesting right atrial pressure of 15 mmHg. Additional Comments: Spectral Doppler performed. Color Doppler performed.  LEFT VENTRICLE PLAX 2D LVIDd:         5.10 cm LVIDs:         3.70 cm LV PW:         1.20 cm LV IVS:        1.40 cm  LV Volumes (MOD) LV vol d, MOD A2C: 36.0 ml LV vol d, MOD A4C: 41.3 ml LV vol s, MOD A2C: 19.9 ml LV vol s, MOD A4C: 16.4 ml LV SV MOD A2C:     16.1 ml LV SV MOD A4C:     41.3 ml LV SV MOD BP:      22.6 ml IVC IVC diam: 2.50 cm  AORTA Ao Asc diam: 3.40 cm TRICUSPID VALVE TV Peak grad:   32.7 mmHg TV Vmax:         2.86 m/s TR Peak grad:   32.8 mmHg TR Vmax:        286.41 cm/s Epifanio Lesches MD Electronically signed by Epifanio Lesches MD Signature Date/Time: 03/14/2023/1:58:11 PM    Final    DG Chest Port 1 View  Result Date: 03/12/2023 CLINICAL DATA:  Small cell lung cancer, dyspnea, EXAM: PORTABLE CHEST 1 VIEW COMPARISON:  03/09/2023 FINDINGS: Improving right basilar consolidation. Stable right paratracheal soft tissue thickening likely related to pathologic adenopathy. Small right pleural effusion persists. Stable pulmonary insufflation. No pneumothorax. No pleural effusion on the left. Left internal jugular chest port tip seen within the right atrium. Cardiac size within normal limits. No acute bone abnormality. IMPRESSION: 1. Improving right basilar consolidation. Stable small right pleural effusion. 2. Stable right paratracheal soft tissue thickening likely related to pathologic adenopathy. Electronically Signed   By: Helyn Numbers M.D.   On: 03/12/2023 20:35    Principal Problem:   Acute respiratory failure with hypoxia [facility]) Active Problems:   Thrombocytopenia (HCC)   Small cell carcinoma of lung, right (HCC)   SVC syndrome   Paroxysmal atrial fibrillation (HCC)   Cancer associated pain   Generalized weakness   Hypoxia   Pleural effusion   Other constipation   Palliative care encounter   Neutropenia, drug-induced (HCC)   Goals of care, counseling/discussion   DNR (do not resuscitate)   High risk medication use  Need for emotional support   Counseling and coordination of care     Roxy Filler Orma Flaming, [facility]  If 7PM-7AM, please contact night-coverage [email or web address]   LOS: 8 days

## 2023-03-14 NOTE — Progress Notes (Signed)
  Echocardiogram 2D Echocardiogram has been performed.  Sheralyn Boatman R 03/14/2023, 1:32 PM

## 2023-03-14 NOTE — Progress Notes (Signed)
2D echo attempted, patient was uncomfortable. RN asked to try later.

## 2023-03-14 NOTE — Progress Notes (Signed)
Chaplain followed-up yesterday to help family with questions and education around the Advanced Directive, Healthcare POA document. Chaplain stopped by today to see if the paperwork had been completed and it had not been. Chaplain was able to pray over Ana Curry and offer supportive and compassionate presence to her and family at bedside.     03/14/23 1400  Spiritual Encounters  Type of Visit Follow up  Reason for visit Routine spiritual support

## 2023-03-14 NOTE — Progress Notes (Addendum)
Ana Curry   DOB:08-Apr-1971   RU#:[id number]   [phone]   Medical oncology follow-up note  Subjective: Patient is well-known to me, I initially met her during her first hospital admission and diagnosis of small cell lung cancer.  She was started to chemotherapy and radiation, readmitted last week due to worsening symptoms.  My partner Dr. Jerrye Noble has been covering me and seeing her in the hospital since this admission.  I saw her in ICU this afternoon, she is on high flow oxygen, PCA, required multiple frequent doses of Ativan for anxiety and restless.  She is very drowsy, still easily arousable, and does recognize me and answer questions in short sentences, but most are appropriate and slow.    Objective:  Vitals:   03/14/23 1627 03/14/23 1700  BP:  120/72  Pulse:  (!) 108  Resp: (!) 36 (!) 36  Temp:    SpO2: 99% 97%    Body mass index is 27.19 kg/m.  Intake/Output Summary (Last 24 hours) at 03/14/2023 1819 Last data filed at 03/14/2023 1747 Gross per 24 hour  Intake 1157.63 ml  Output 1900 ml  Net -742.37 ml     Sclerae unicteric  (+) Significant edema in head and neck, and anasarca  Multiple ecchymosis  Neuro nonfocal    CBG (last 3)  No results for input(s): "GLUCAP" in the last 72 hours.   Labs:    Urine Studies No results for input(s): "UHGB", "CRYS" in the last 72 hours.  Invalid input(s): "UACOL", "UAPR", "USPG", "UPH", "UTP", "UGL", "UKET", "UBIL", "UNIT", "UROB", "ULEU", "UEPI", "UWBC", "URBC", "UBAC", "CAST", "UCOM", "BILUA"  Basic Metabolic Panel: Recent Labs  Lab 03/08/23 0323 03/09/23 0341 03/12/23 1910 03/13/23 0340 03/14/23 0547  NA 134* 132* 132* 131* 133*  K 3.7 3.8 3.8 3.8 4.1  CL 98 96* 97* 97* 97*  CO2 27 25 25 25 28   GLUCOSE 109* 108* 159* 116* 100*  BUN 16 15 21* 18 15  CREATININE 0.93 0.82 0.73 0.72 0.65  CALCIUM 9.0 9.3 8.5* 8.4* 8.4*  MG  --   --  2.4  --   --   PHOS  --   --  3.6  --   --    GFR Estimated Creatinine  Clearance: 91.9 mL/min (by C-G formula based on SCr of 0.65 mg/dL). Liver Function Tests: No results for input(s): "AST", "ALT", "ALKPHOS", "BILITOT", "PROT", "ALBUMIN" in the last 168 hours. No results for input(s): "LIPASE", "AMYLASE" in the last 168 hours. No results for input(s): "AMMONIA" in the last 168 hours. Coagulation profile No results for input(s): "INR", "PROTIME" in the last 168 hours.  CBC: Recent Labs  Lab 03/10/23 0340 03/11/23 0250 03/12/23 0754 03/12/23 1910 03/13/23 0340 03/14/23 0547 03/14/23 1440  WBC 1.7* 1.8* 1.3* 1.3* 1.3* 1.8*  --   NEUTROABS 0.9* 0.9* 0.5*  --  0.2* 0.8*  --   HGB 8.2* 7.4* 7.0* 6.3* 7.3* 6.7* 7.5*  HCT 24.9* 22.2* 21.2* 19.5* 22.2* 20.6* 22.4*  MCV 96.1 96.9 97.7 97.5 97.4 98.1  --   PLT 29* 20* 15* 13* 13* 12*  --    Cardiac Enzymes: No results for input(s): "CKTOTAL", "CKMB", "CKMBINDEX", "TROPONINI" in the last 168 hours. BNP: Invalid input(s): "POCBNP" CBG: No results for input(s): "GLUCAP" in the last 168 hours. D-Dimer No results for input(s): "DDIMER" in the last 72 hours. Hgb A1c No results for input(s): "HGBA1C" in the last 72 hours. Lipid Profile No results for input(s): "CHOL", "HDL", "LDLCALC", "TRIG", "  CHOLHDL", "LDLDIRECT" in the last 72 hours. Thyroid function studies No results for input(s): "TSH", "T4TOTAL", "T3FREE", "THYROIDAB" in the last 72 hours.  Invalid input(s): "FREET3" Anemia work up No results for input(s): "VITAMINB12", "FOLATE", "FERRITIN", "TIBC", "IRON", "RETICCTPCT" in the last 72 hours. Microbiology Recent Results (from the past 240 hour(s))  MRSA Next Gen by PCR, Nasal     Status: None   Collection Time: 03/11/23 11:15 AM   Specimen: Nasal Mucosa; Nasal Swab  Result Value Ref Range Status   MRSA by PCR Next Gen NOT DETECTED NOT DETECTED Final    Comment: (NOTE) The GeneXpert MRSA Assay (FDA approved for NASAL specimens only), is one component of a comprehensive MRSA colonization  surveillance program. It is not intended to diagnose MRSA infection nor to guide or monitor treatment for MRSA infections. Test performance is not FDA approved in patients less than 61 years old. Performed at [facility], [address] [place], [place] [address]       Studies:  ECHOCARDIOGRAM LIMITED  Result Date: 03/14/2023    ECHOCARDIOGRAM LIMITED REPORT   Patient Name:   Ana Curry Date of Exam: 03/14/2023 Medical Rec #:  [id number]         Height:       68.0 in Accession #:    [id number]        Weight:       178.8 lb Date of Birth:  [birth year]        BSA:          1.949 m Patient Age:    52 years          BP:           110/70 mmHg Patient Gender: F                 HR:           112 bpm. Exam Location:  Inpatient Procedure: Limited Echo, Cardiac Doppler and Color Doppler Indications:    I31.3 Pericardial effusion (noninflammatory)  History:        Patient has prior history of Echocardiogram examinations, most                 recent 02/24/2023. Abnormal ECG, COPD, Arrythmias:Atrial                 Fibrillation and Tachycardia; Signs/Symptoms:Dyspnea and                 Shortness of Breath. Lung cancer. Pericardial effusion.  Sonographer:    Sheralyn Boatman RDCS Referring Phys: [id number] [facility]  Sonographer Comments: Technically difficult study due to poor echo windows. Patient lying in right decubitus position. IMPRESSIONS  1. Left ventricular ejection fraction, by estimation, is 55 to 60%. The left ventricle has normal function.  2. Right ventricular systolic function is mildly reduced. The right ventricular size is normal. There is moderately elevated pulmonary artery systolic pressure. The estimated right ventricular systolic pressure is 47.8 mmHg.  3. A small pericardial effusion is present. The pericardial effusion is localized near the right atrium and anterior to the right ventricle. There is no evidence of cardiac tamponade.  4. The inferior vena cava is dilated in  size with <50% respiratory variability, suggesting right atrial pressure of 15 mmHg. Comparison(s): No significant change from prior study. FINDINGS  Left Ventricle: Left ventricular ejection fraction, by estimation, is 55 to 60%. The left ventricle has normal function. Right Ventricle: The right ventricular size is normal.  Right ventricular systolic function is mildly reduced. There is moderately elevated pulmonary artery systolic pressure. The tricuspid regurgitant velocity is 2.86 m/s, and with an assumed right atrial pressure of 15 mmHg, the estimated right ventricular systolic pressure is 47.8 mmHg. Pericardium: A small pericardial effusion is present. The pericardial effusion is localized near the right atrium and anterior to the right ventricle. There is no evidence of cardiac tamponade. Tricuspid Valve: The tricuspid valve is normal in structure. Tricuspid valve regurgitation is mild . No evidence of tricuspid stenosis. Venous: The inferior vena cava is dilated in size with less than 50% respiratory variability, suggesting right atrial pressure of 15 mmHg. Additional Comments: Spectral Doppler performed. Color Doppler performed.  LEFT VENTRICLE PLAX 2D LVIDd:         5.10 cm LVIDs:         3.70 cm LV PW:         1.20 cm LV IVS:        1.40 cm  LV Volumes (MOD) LV vol d, MOD A2C: 36.0 ml LV vol d, MOD A4C: 41.3 ml LV vol s, MOD A2C: 19.9 ml LV vol s, MOD A4C: 16.4 ml LV SV MOD A2C:     16.1 ml LV SV MOD A4C:     41.3 ml LV SV MOD BP:      22.6 ml IVC IVC diam: 2.50 cm  AORTA Ao Asc diam: 3.40 cm TRICUSPID VALVE TV Peak grad:   32.7 mmHg TV Vmax:        2.86 m/s TR Peak grad:   32.8 mmHg TR Vmax:        286.41 cm/s Epifanio Lesches MD Electronically signed by Epifanio Lesches MD Signature Date/Time: 03/14/2023/1:58:11 PM    Final    DG Chest Port 1 View  Result Date: 03/12/2023 CLINICAL DATA:  Small cell lung cancer, dyspnea, EXAM: PORTABLE CHEST 1 VIEW COMPARISON:  03/09/2023 FINDINGS: Improving  right basilar consolidation. Stable right paratracheal soft tissue thickening likely related to pathologic adenopathy. Small right pleural effusion persists. Stable pulmonary insufflation. No pneumothorax. No pleural effusion on the left. Left internal jugular chest port tip seen within the right atrium. Cardiac size within normal limits. No acute bone abnormality. IMPRESSION: 1. Improving right basilar consolidation. Stable small right pleural effusion. 2. Stable right paratracheal soft tissue thickening likely related to pathologic adenopathy. Electronically Signed   By: Helyn Numbers M.D.   On: 03/12/2023 20:35    Assessment: 52 y.o. 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  Her labs expectancy is likely a few days to a few weeks.  -I discussed with patient and her father on the phone.  I do think patient understood the conversation, and she asked me " is that it, I am going to die soon".  I instructed her that her comfort is our priority. -Very much appreciate ICU team and palliative care Dr. Colen Darling, who has  communicated with the entire team extensively today and also recommended comfort care. -Patient's father will come back tomorrow morning around 10 AM, will talk to Dr. Colen Darling and her team and transition her to comfort care. -I will f/u on Friday if  she is still in [facility].  I spent a total of 50 minutes for her care today.   Malachy Mood, MD 03/14/2023  6:19 PM

## 2023-03-14 NOTE — Progress Notes (Signed)
OT Cancellation Note  Patient Details Name: Ana Curry MRN: [id number] DOB: 1970/12/19   Cancelled Treatment:    Reason Eval/Treat Not Completed: Medical issues which prohibited therapy Nurse asking for therapy to hold. OT to continue to follow as goals of care are developed and check back on 7/4.  Rosalio Loud, MS Acute Rehabilitation Department Office# [phone] 03/14/2023, 10:17 AM

## 2023-03-14 NOTE — Progress Notes (Signed)
Pt restless, getting intermittently confused. Pt taking oxygen off. Dr. Patterson Hammersmith, & Dr. Luberta Robertson notified of pt's current condition. Valium 5 mg prn given for anxiety/restlessness. Pt stable at this time.

## 2023-03-15 ENCOUNTER — Other Ambulatory Visit: Payer: Self-pay

## 2023-03-15 DIAGNOSIS — Z711 Person with feared health complaint in whom no diagnosis is made: Secondary | ICD-10-CM

## 2023-03-15 LAB — COMPREHENSIVE METABOLIC PANEL
ALT: 13 U/L (ref 0–44)
AST: 19 U/L (ref 15–41)
Albumin: 2.3 g/dL — ABNORMAL LOW (ref 3.5–5.0)
Alkaline Phosphatase: 171 U/L — ABNORMAL HIGH (ref 38–126)
Anion gap: 10 (ref 5–15)
BUN: 11 mg/dL (ref 6–20)
CO2: 28 mmol/L (ref 22–32)
Calcium: 8.1 mg/dL — ABNORMAL LOW (ref 8.9–10.3)
Chloride: 96 mmol/L — ABNORMAL LOW (ref 98–111)
Creatinine, Ser: 0.6 mg/dL (ref 0.44–1.00)
GFR, Estimated: >60 mL/min (ref >60)
Glucose, Bld: 135 mg/dL — ABNORMAL HIGH (ref 70–99)
Potassium: 4.1 mmol/L (ref 3.5–5.1)
Sodium: 134 mmol/L — ABNORMAL LOW (ref 135–145)
Total Bilirubin: 0.5 mg/dL (ref 0.3–1.2)
Total Protein: 5.9 g/dL — ABNORMAL LOW (ref 6.5–8.1)

## 2023-03-15 LAB — CBC WITH DIFFERENTIAL/PLATELET
Abs Immature Granulocytes: 0.3 10*3/uL — ABNORMAL HIGH (ref 0.00–0.07)
Band Neutrophils: 14 %
Basophils Absolute: 0 10*3/uL (ref 0.0–0.1)
Basophils Relative: 0 %
Eosinophils Absolute: 0 10*3/uL (ref 0.0–0.5)
Eosinophils Relative: 0 %
HCT: 23.7 % — ABNORMAL LOW (ref 36.0–46.0)
Hemoglobin: 7.7 g/dL — ABNORMAL LOW (ref 12.0–15.0)
Lymphocytes Relative: 26 %
Lymphs Abs: 0.8 10*3/uL (ref 0.7–4.0)
MCH: 31.7 pg (ref 26.0–34.0)
MCHC: 32.5 g/dL (ref 30.0–36.0)
MCV: 97.5 fL (ref 80.0–100.0)
Metamyelocytes Relative: 8 %
Monocytes Absolute: 0.1 10*3/uL (ref 0.1–1.0)
Monocytes Relative: 4 %
Neutro Abs: 2 10*3/uL (ref 1.7–7.7)
Neutrophils Relative %: 48 %
Platelets: 12 10*3/uL — CL (ref 150–400)
RBC: 2.43 MIL/uL — ABNORMAL LOW (ref 3.87–5.11)
RDW: 16.4 % — ABNORMAL HIGH (ref 11.5–15.5)
WBC: 3.2 10*3/uL — ABNORMAL LOW (ref 4.0–10.5)
nRBC: 1.2 % — ABNORMAL HIGH (ref 0.0–0.2)

## 2023-03-15 LAB — TYPE AND SCREEN
ABO/RH(D): A POS
Antibody Screen: NEGATIVE
Unit division: 0

## 2023-03-15 LAB — BPAM RBC
Blood Product Expiration Date: 202407222359
Blood Product Expiration Date: 202407252359
ISSUE DATE / TIME: 202407012201
ISSUE DATE / TIME: 202407030842
Unit Type and Rh: 6200

## 2023-03-15 MED ORDER — HYDROMORPHONE BOLUS VIA INFUSION
4.0000 mg | INTRAVENOUS | Status: DC | PRN
  Administered 2023-03-15: 4 mg via INTRAVENOUS

## 2023-03-15 MED ORDER — POLYVINYL ALCOHOL 1.4 % OP SOLN: 1.0000 [drp] | Freq: Four times a day (QID) | OPHTHALMIC | Status: DC | PRN

## 2023-03-15 MED ORDER — MORPHINE SULFATE (PF) 4 MG/ML IV SOLN
5.0000 mg | INTRAVENOUS | Status: DC | PRN
  Administered 2023-03-15: 5 mg via INTRAVENOUS
  Filled 2023-03-15: qty 2

## 2023-03-15 MED ORDER — ORAL CARE MOUTH RINSE: 15.0000 mL | OROMUCOSAL | Status: DC | PRN

## 2023-03-15 MED ORDER — DIAZEPAM 5 MG/ML IJ SOLN: 5.0000 mg | INTRAMUSCULAR | Status: DC | PRN

## 2023-03-15 MED ORDER — HYDROMORPHONE BOLUS VIA INFUSION
2.0000 mg | INTRAVENOUS | Status: DC | PRN
  Administered 2023-03-15: 2 mg via INTRAVENOUS

## 2023-03-15 MED ORDER — MORPHINE SULFATE (PF) 4 MG/ML IV SOLN
INTRAVENOUS | Status: AC
  Administered 2023-03-15: 4 mg
  Filled 2023-03-15: qty 2

## 2023-03-15 MED ORDER — BIOTENE DRY MOUTH MT LIQD: 15.0000 mL | OROMUCOSAL | Status: DC | PRN

## 2023-03-15 MED ORDER — GLYCOPYRROLATE 0.2 MG/ML IJ SOLN
0.2000 mg | INTRAMUSCULAR | Status: DC
  Administered 2023-03-15 (×2): 0.2 mg via INTRAVENOUS
  Filled 2023-03-15 (×2): qty 1

## 2023-03-15 MED ORDER — DIAZEPAM 5 MG/ML IJ SOLN
2.5000 mg | INTRAMUSCULAR | Status: DC | PRN
  Administered 2023-03-15: 2.5 mg via INTRAVENOUS
  Filled 2023-03-15: qty 2

## 2023-03-15 MED ORDER — HYDROMORPHONE HCL-NACL 50-0.9 MG/50ML-% IV SOLN
1.0000 mg/h | INTRAVENOUS | Status: DC
  Administered 2023-03-15: 0.5 mg/h via INTRAVENOUS
  Filled 2023-03-15 (×3): qty 50

## 2023-03-15 MED ORDER — GLYCOPYRROLATE 0.2 MG/ML IJ SOLN
0.2000 mg | INTRAMUSCULAR | Status: DC | PRN
  Administered 2023-03-15: 0.2 mg via INTRAVENOUS
  Filled 2023-03-15: qty 1

## 2023-03-15 MED ORDER — HYDROMORPHONE 1 MG/ML IV SOLN: INTRAVENOUS | Status: DC

## 2023-03-15 MED ORDER — HYDROMORPHONE BOLUS VIA INFUSION: 2.0000 mg | INTRAVENOUS | Status: DC | PRN

## 2023-03-15 MED ORDER — MIDAZOLAM HCL 2 MG/2ML IJ SOLN
1.0000 mg | INTRAMUSCULAR | Status: DC | PRN
  Administered 2023-03-15 (×2): 1 mg via INTRAVENOUS
  Filled 2023-03-15 (×2): qty 2

## 2023-03-15 MED ORDER — MORPHINE SULFATE (PF) 4 MG/ML IV SOLN
5.0000 mg | INTRAVENOUS | Status: DC | PRN
  Administered 2023-03-15: 5 mg via INTRAVENOUS

## 2023-03-15 MED ORDER — HYDROMORPHONE BOLUS VIA INFUSION
1.0000 mg | INTRAVENOUS | Status: DC | PRN
  Administered 2023-03-15 (×2): 1 mg via INTRAVENOUS

## 2023-03-15 NOTE — Progress Notes (Signed)
PROGRESS NOTE    Ana Curry  ZOX:[id number]  DOB: 04-15-1971  DOA: 03/06/2023 PCP: [facility]:   Hospital course:  52 year old female with recent diagnosis of metastatic small cell cancer was admitted with acute hypoxic respiratory failure, SVC syndrome with hypotension and bilateral pleural effusions.  Patient has been seen by oncology and palliative care.  Her SVC syndrome progressed and she was not considered a candidate for XRT.  Patient was placed on comfort care   Subjective:  Patient is on comfort care, family is at bedside.   Objective: Vitals:   03/15/23 0858 03/15/23 0900 03/15/23 1052 03/15/23 1600  BP: 122/72 122/72    Pulse: (!) 109 (!) 111  (!) 119  Resp: 20 18 (!) 28 16  Temp:      TempSrc:      SpO2: 100% 100% 100% (!) 33%  Weight:      Height:        Intake/Output Summary (Last 24 hours) at 03/15/2023 1653 Last data filed at 03/15/2023 1555 Gross per 24 hour  Intake 1250.86 ml  Output 520 ml  Net 730.86 ml    Filed Weights   03/09/23 0500 03/13/23 0500 03/15/23 0406  Weight: 78.2 kg 81.1 kg 80.1 kg     Exam:  General: Face and neck significantly more swollen than they were yesterday.  Patient is on Dilaudid drip, somewhat restless. Rest of physical exam was deferred    Data Reviewed:  Basic Metabolic Panel: Recent Labs  Lab 03/09/23 0341 03/12/23 1910 03/13/23 0340 03/14/23 0547 03/15/23 0422  NA 132* 132* 131* 133* 134*  K 3.8 3.8 3.8 4.1 4.1  CL 96* 97* 97* 97* 96*  CO2 25 25 25 28 28   GLUCOSE 108* 159* 116* 100* 135*  BUN 15 21* 18 15 11   CREATININE 0.82 0.73 0.72 0.65 0.60  CALCIUM 9.3 8.5* 8.4* 8.4* 8.1*  MG  --  2.4  --   --   --   PHOS  --  3.6  --   --   --      CBC: Recent Labs  Lab 03/11/23 0250 03/12/23 0754 03/12/23 1910 03/13/23 0340 03/14/23 0547 03/14/23 1440 03/15/23 0422  WBC 1.8* 1.3* 1.3* 1.3* 1.8*  --  3.2*  NEUTROABS 0.9* 0.5*  --  0.2* 0.8*  --   2.0  HGB 7.4* 7.0* 6.3* 7.3* 6.7* 7.5* 7.7*  HCT 22.2* 21.2* 19.5* 22.2* 20.6* 22.4* 23.7*  MCV 96.9 97.7 97.5 97.4 98.1  --  97.5  PLT 20* 15* 13* 13* 12*  --  12*      Scheduled Meds:  glycopyrrolate  0.2 mg Intravenous Q4H   nicotine  14 mg Transdermal Daily   sodium chloride flush  3 mL Intravenous Q12H   Continuous Infusions:  HYDROmorphone 1 mg/hr (03/15/23 1555)     Assessment & Plan:   Comfort care Patient is being actively managed by palliative care, much appreciated as her SVC syndrome worsens.  Patient is being actively managed for shortness of breath and anxiety.  Family is at bedside with support from chaplain and [facility] care and nurses.    From previous note, not addressed today: Extensive stage small cell cancer s/p one course of treatment Pancytopenia SVC syndrome Acute hypoxic respiratory failure Bilateral pleural effusion Plan was for radiation for SVC syndrome however there has been conversation back-and-forth about whether she would be a candidate for this and whether she would be able to tolerate this.  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 every 12 hours as needed in addition to her Valium 10 mg twice daily she is already on Being followed by palliative care  Goals for care Very much appreciate ongoing discussions with patient and family by palliative care consult Apparently patient's mother also died of small cell lung cancer Patient is presently DNR Ongoing discussions as we explore what therapeutic options are available to her  H/O PAF Moderate pericardial  effusion Not in tamponade at present Echocardiogram is ordered and pending On admission patient developed A-fib with RVR and is continued on amiodarone No anticoagulation given thrombocytopenia     DVT prophylaxis: SCD due to thrombocytopenia Code Status: DNR Family Communication: Patient's aunt and father were at bedside throughout     Studies: ECHOCARDIOGRAM LIMITED  Result Date: 03/14/2023    ECHOCARDIOGRAM LIMITED REPORT   Patient Name:   Ana Curry Date of Exam: 03/14/2023 Medical Rec #:  [id number]         Height:       68.0 in Accession #:    [id number]        Weight:       178.8 lb Date of Birth:  24-Feb-1971        BSA:          1.949 m Patient Age:    51 years          BP:           110/70 mmHg Patient Gender: F                 HR:           112 bpm. Exam Location:  Inpatient Procedure: Limited Echo, Cardiac Doppler and Color Doppler Indications:    I31.3 Pericardial effusion (noninflammatory)  History:        Patient has prior history of Echocardiogram examinations, most                 recent 02/24/2023. Abnormal ECG, COPD, Arrythmias:Atrial                 Fibrillation and Tachycardia; Signs/Symptoms:Dyspnea and                 Shortness of Breath. Lung cancer. Pericardial effusion.  Sonographer:    Sheralyn Boatman RDCS Referring Phys: [id number] [facility]  Sonographer Comments: Technically difficult study due to poor echo windows. Patient lying in right decubitus position. IMPRESSIONS  1. Left ventricular ejection fraction, by estimation, is 55 to 60%. The left ventricle has normal function.  2. Right ventricular systolic function is mildly reduced. The right ventricular size is normal. There is moderately elevated pulmonary artery systolic pressure. The estimated right ventricular systolic pressure is 47.8 mmHg.  3. A small pericardial effusion is present. The pericardial effusion is localized near the right atrium and anterior to the right ventricle. There is no evidence of cardiac  tamponade.  4. The inferior vena cava is dilated in size with <50% respiratory variability, suggesting right atrial pressure of 15 mmHg. Comparison(s): No significant change from prior study. FINDINGS  Left  Ventricle: Left ventricular ejection fraction, by estimation, is 55 to 60%. The left ventricle has normal function. Right Ventricle: The right ventricular size is normal. Right ventricular systolic function is mildly reduced. There is moderately elevated pulmonary artery systolic pressure. The tricuspid regurgitant velocity is 2.86 m/s, and with an assumed right atrial pressure of 15 mmHg, the estimated right ventricular systolic pressure is 47.8 mmHg. Pericardium: A small pericardial effusion is present. The pericardial effusion is localized near the right atrium and anterior to the right ventricle. There is no evidence of cardiac tamponade. Tricuspid Valve: The tricuspid valve is normal in structure. Tricuspid valve regurgitation is mild . No evidence of tricuspid stenosis. Venous: The inferior vena cava is dilated in size with less than 50% respiratory variability, suggesting right atrial pressure of 15 mmHg. Additional Comments: Spectral Doppler performed. Color Doppler performed.  LEFT VENTRICLE PLAX 2D LVIDd:         5.10 cm LVIDs:         3.70 cm LV PW:         1.20 cm LV IVS:        1.40 cm  LV Volumes (MOD) LV vol d, MOD A2C: 36.0 ml LV vol d, MOD A4C: 41.3 ml LV vol s, MOD A2C: 19.9 ml LV vol s, MOD A4C: 16.4 ml LV SV MOD A2C:     16.1 ml LV SV MOD A4C:     41.3 ml LV SV MOD BP:      22.6 ml IVC IVC diam: 2.50 cm  AORTA Ao Asc diam: 3.40 cm TRICUSPID VALVE TV Peak grad:   32.7 mmHg TV Vmax:        2.86 m/s TR Peak grad:   32.8 mmHg TR Vmax:        286.41 cm/s Epifanio Lesches MD Electronically signed by Epifanio Lesches MD Signature Date/Time: 03/14/2023/1:58:11 PM    Final     Principal Problem:   Acute respiratory failure with hypoxia (HCC) Active Problems:   Thrombocytopenia (HCC)   Small  cell carcinoma of lung, right (HCC)   SVC syndrome   Paroxysmal atrial fibrillation (HCC)   Cancer associated pain   Generalized weakness   Hypoxia   Pleural effusion   Other constipation   Palliative care encounter   Neutropenia, drug-induced (HCC)   Goals of care, counseling/discussion   DNR (do not resuscitate)   High risk medication use   Need for emotional support   Counseling and coordination of care   Concern about end of life     Linde Wilensky Orma Flaming, [facility]  If 7PM-7AM, please contact night-coverage [email or web address]   LOS: 9 days

## 2023-03-15 NOTE — Progress Notes (Signed)
Daily Progress Note   Patient Name: Ana Curry       Date: 03/15/2023 DOB: Mar 23, 1971  Age: 52 y.o. MRN#: [id number] Attending Physician: Salomon Mast* Primary Care Physician: [facility] Admit Date: 03/06/2023 Length of Stay: 9 days  Reason for Consultation/Follow-up: Establishing goals of care and symptom management  Subjective:   CC: Patient's respiratory status worsening. Following up regarding complex medical decision making and symptom management.   Subjective:  Reviewed EMR prior to presenting to bedside.  Dr. Mosetta Putt, oncologist, visited with patient last night and agreed with transition to comfort focused care. Discussed care with bedside RN this morning.  Patient's respiratory status has continued to deteriorate to the point she is now on high flow in addition to nonrebreather.  Presented to bedside this morning to discuss care with patient and family at bedside.  Family included patient's father and aunt.  Able to discuss patient's worsening multiorgan failure, particularly respiratory status.  Explained at this time should focus on patient's comfort at the end of life.  Patient inquiring about prognosis and expressed concern of hours to days due to how rapidly her medical status has continued to deteriorate.  Discussed what comfort focused care would include such as medications for dyspnea and anxiety.  Patient agreeing with continuing these medications and making adjustments as needed for comfort.  Expressed worry that patient may pass away in the hospital due to her respiratory status being too unstable for transfer at this time.  Provided emotional support via active listening.  Answered all questions as able at that time.  Updated IDT about transition to comfort focused care.  Review of Systems Worsening dyspnea Objective:   Vital Signs:  BP 122/72 (BP Location: Right Arm)   Pulse (!) 109   Temp 99.1 F (37.3 C) (Axillary)   Resp 20   Ht  5\' 8"  (1.727 m)   Wt 80.1 kg   LMP 01/22/2017   SpO2 100%   BMI 26.85 kg/m   Physical Exam: General: awake, interactive, chronically ill appearing, pale Eyes: No drainage noted HENT: Dry mucous membranes Cardiovascular: Tachycardia noted Respiratory: increased work of breathing noted Abdomen: not distended Extremities: Moving all appropriately Skin: no rashes or lesions on visible skin Neuro: A&Ox4, following commands easily Psych: appropriately answers all questions  Imaging:  I personally reviewed recent imaging.   Assessment & Plan:   Assessment: Patient is a 52 year old female with a past medical history of metastatic small cell lung cancer complicated by SVC syndrome who was admitted on 03/06/2023 from radiation clinic for management of hypotension, tachycardia, generalized weakness, and shortness of breath.  Patient had recently been hospitalized from 6/14-24 for worsening shortness of breath of the past 2 months and was found to have SVC syndrome.  Since admission, patient has received management for acute hypoxic respiratory failure due to to her extensive SCLC.  Bone marrow biopsy has shown metastatic disease as well.  Continuing radiation while admitted.  Oncology following with patient's care as well.  Palliative medicine team consulted to assist with complex medical decision making and symptom management.  Recommendations/Plan: # Complex medical decision making/goals of care:  - Discussed care with patient and family as detailed above in HPI. Patient's overall medical status continues to rapidly deteriorate.  Transitioning to full comfort focused care at this time.  Based on patient's respiratory status, she is not appropriate for transfer and so at this time in-hospital death is anticipated.  Discussed this with patient and family.  -Patient  has already stated that if she was unable to make medical decisions for herself, her father would make medical decisions on her  behalf.  Patient does not have any children.  Patient does NOT want her stepmother involved in medical conversations.  -  Code Status: DNR  # Symptom management:  -Dyspnea, in the setting of metastatic SCLC   -Continue IV dilaudid PCA to 0.5mg  q69min bolus. Increase IV dilaudid basal rate at 0.5mg /hr.    -Anxiety Patient continues to have uncontrolled anxiety.    -Start IV Valium 2.5mg  q4hrs prn. May need to consider scheduling or further adjusting dose.    -Discontinue IV Ativan in exchange for valium.  # Psychosocial Support:  -Father, aunt  # Discharge Planning: Transition to full comfort care at this time.  Based on patient's respiratory status, not appropriate for transfer.  Currently in-hospital death anticipated.  Discussed with: patient, bedside RN, hospitalist, patient's family   Thank you for allowing the palliative care team to participate in the care [facility].  Alvester Morin, DO Palliative Care Provider PMT # [phone]  If patient remains symptomatic despite maximum doses, please call PMT at [phone] between 0700 and 1900. Outside of these hours, please call attending, as PMT does not have night coverage.  *Please note that this is a verbal dictation therefore any spelling or grammatical errors are due to the "[employer]" system interpretation.

## 2023-03-15 NOTE — Progress Notes (Signed)
  Daily Progress Note   Patient Name: Ana Curry       Date: 03/15/2023 DOB: Mar 28, 1971  Age: 52 y.o. MRN#: [id number] Attending Physician: Salomon Mast* Primary Care Physician: [facility] Admit Date: 03/06/2023 Length of Stay: 9 days  Will place full consult note as soon as able. Discussed care with patient and family, including father, at bedside this morning. Patient's respiratory status continues to rapidly deteriorate requiring HFNC and non-rebreather. Patient will be transitioned to full comfort care at this time. Due to high oxygen requirements currently, will remain in the hospital for comfort focused are. Currently in hospital death anticipated.    Alvester Morin, DO Palliative Care Provider PMT # [phone]

## 2023-03-15 NOTE — Progress Notes (Signed)
Chaplain provided follow up support for family, who requested prayer.  Chaplain facilitated sharing stories about Kariel and provided emotional support.

## 2023-03-15 NOTE — Progress Notes (Signed)
   03/15/23 1114  Spiritual Encounters  Type of Visit Initial  Care provided to: Pt and family  Referral source Physician  Reason for visit End-of-life  OnCall Visit No  Spiritual Framework  Presenting Themes Meaning/purpose/sources of inspiration;Values and beliefs;Significant life change;Impactful experiences and emotions  Values/beliefs Father-belief in pt's salvation and "what the next step is" (belief in heaven)  Patient Stress Factors Health changes;Major life changes  Family Stress Factors Loss  Goals  Clinical Care Goals comfort  Interventions  Spiritual Care Interventions Made Established relationship of care and support;Compassionate presence;Reflective listening;Explored values/beliefs/practices/strengths;Bereavement/grief support;Prayer;Encouragement  Intervention Outcomes  Outcomes Reduced anxiety;Reduced fear;Connection to spiritual care;Awareness around self/spiritual resourses;Awareness of support  Spiritual Care Plan  Spiritual Care Issues Still Outstanding Additional issues (comment) (patient is EOL, chaplain will notify team to follow)  Follow up plan  Chaplain will refer to team to follow as needed/requested   Patient was in and out of consciousness. Patient was gasping and appeared to be struggling to stay awake. Patient's father [name]) and aunt [name]) were at bedside. Family appears to be accepting of pt prognosis. Family is comforted and has hope in their faith and in the salvation as well as "next step" (heaven) for patient. Family requested chaplain to pray for peace and comfort for patient. After prayer patient requested chaplain to "continue praying" for her. Chaplain will refer to chaplain team to follow up.   Arlyce Dice, Chaplain Resident

## 2023-03-15 NOTE — Progress Notes (Signed)
    Patient Name: ASAYO BUCKALEW           DOB: 1971-01-11  MRN: [id number]      Admission Date: 03/06/2023  Attending Provider: Leandro Reasoner Tubl*  Primary Diagnosis: Acute respiratory failure with hypoxia [facility])   Level of care: Stepdown    CROSS COVER NOTE   Date of Service   03/15/2023   Almira Coaster, 52 y.o. female, was admitted on 03/06/2023 for Acute respiratory failure with hypoxia (HCC).    HPI/Events of Note   Patient requesting oral Valium instead of Ativan.  States she prefers Valium at this time as Ativan makes her too drowsy or "out of it."   Interventions/ Plan   5 mg oral Valium-single dose        Anthoney Harada, DNP, [employer]- [employer] [facility] [place]

## 2023-03-15 NOTE — TOC Progression Note (Signed)
Transition of Care [facility]) - Progression Note    Patient Details  Name: Ana Curry MRN: [id number] Date of Birth: [birth year]  Transition of [facility]) CM/SW Contact  Amada Jupiter, LCSW Phone Number: 03/15/2023, 9:22 AM  Clinical Narrative:    TOC continuing to monitor dc needs.  Per chart, appears plan is comfort care.     Expected Discharge Plan: Skilled Nursing Facility Barriers to Discharge: Continued Medical Work up  Expected Discharge Plan and Services In-house Referral: NA Discharge Planning Services: NA Post Acute Care Choice: Skilled Nursing Facility Living arrangements for the past 2 months: Single Family Home                 DME Arranged: N/A DME Agency: NA       HH Arranged: NA HH Agency: NA         Social Determinants of Health (SDOH) Interventions SDOH Screenings   Food Insecurity: No Food Insecurity (03/06/2023)  Housing: Low Risk  (03/06/2023)  Transportation Needs: No Transportation Needs (03/06/2023)  Utilities: Not At Risk (03/06/2023)  Tobacco Use: High Risk (03/06/2023)    Readmission Risk Interventions    03/07/2023    1:15 PM  Readmission Risk Prevention Plan  Transportation Screening Complete  Home Care Screening Complete  Medication Review (RN CM) Complete

## 2023-03-16 ENCOUNTER — Ambulatory Visit: Payer: Medicaid Other

## 2023-03-19 ENCOUNTER — Encounter: Payer: Self-pay | Admitting: Radiation Oncology

## 2023-03-19 ENCOUNTER — Ambulatory Visit: Payer: Medicaid Other

## 2023-03-19 LAB — CYTOLOGY - NON PAP

## 2023-03-20 ENCOUNTER — Ambulatory Visit: Payer: Medicaid Other

## 2023-03-21 ENCOUNTER — Ambulatory Visit: Payer: Medicaid Other

## 2023-03-22 ENCOUNTER — Ambulatory Visit: Payer: Medicaid Other

## 2023-03-23 ENCOUNTER — Ambulatory Visit: Payer: Medicaid Other

## 2023-03-26 ENCOUNTER — Ambulatory Visit: Payer: Medicaid Other

## 2023-03-27 ENCOUNTER — Ambulatory Visit: Payer: Medicaid Other

## 2023-03-28 ENCOUNTER — Ambulatory Visit: Payer: Medicaid Other

## 2023-03-28 NOTE — Progress Notes (Signed)
  Radiation Oncology         [phone] ________________________________  Name: Ana Curry MRN: [id number]  Date: 03/19/2023  DOB: 1970/10/06  End of Treatment Note  Diagnosis:   Small Cell Carcinoma of the right lung     Indication for treatment: palliative       Radiation treatment dates:   02/10/23-03/09/23  Site/planned dose:   The right lung was to be treated to 8 Gy, 3 Gy on 03/02/23, then 2.5 Gy on 03/05/23 and 03/09/23. Her treatment was held multiple days due to plt counts of <40K.   Narrative: The patient's status declined during her course of radiation, and her radiotherapy was discontinued after 3 treatments.  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 ________________________________    Osker Mason, PAC

## 2023-03-29 ENCOUNTER — Ambulatory Visit: Payer: Medicaid Other

## 2023-03-30 ENCOUNTER — Ambulatory Visit: Payer: Medicaid Other

## 2023-04-12 NOTE — Progress Notes (Signed)
    Patient Name: Ana Curry           DOB: September 07, 1971  MRN: [id number]       OVERNIGHT EVENT    Notified by RN that patient has expired at 0020.  2 RN verified. Patient was comfort care.   Family is at bedside.   Anthoney Harada, DNP, ACNPC- AG [facility]

## 2023-04-12 NOTE — Death Summary Note (Signed)
[facility] Death Note                                                                                                                                                                                               Ana Curry, is a 52 y.o. female, DOB - 11-30-1970, ZOX:[id number]  Admit date - 02/16/2023   Admitting Physician Charlsie Quest, MD  Outpatient Primary MD for the patient is [facility]  LOS - 10  Chief Complaint  Patient presents with   Back Pain       Notification: [facility] notified of death of 03-31-23   Date and Time of Death -Mar 31, 2023 at 0020  Pronounced by -nurse practitioner Anthoney Harada as well as another nurse  History of present illness:   Ana Curry is a 52 y.o. 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  Patient had recently been hospitalized from 6/14-24 for worsening shortness of breath of the past 2 months and was found to have SVC syndrome.  Since admission, patient has received management for acute hypoxic respiratory failure due to to her extensive SCLC.  Bone marrow biopsy has shown metastatic disease as well.   Over the course of her treatment here patient's SVC syndrome continued to worsen.  Unfortunately she was not considered a candidate for radiation therapy.  The day before her demise she became more acutely short of breath and noted increasing swelling about her face and neck.  Palliative care was consulted and she was transitioned over to comfort care.  Patient father was involved with the decision making throughout.  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

## 2023-04-12 DEATH — deceased

## 2023-04-13 NOTE — Radiation Completion Notes (Signed)
Patient Name: Ana Curry, Ana Curry MRN: [id number] Date of Birth: Oct 15, 1970 Referring Physician: Malachy Mood, M.D. Date of Service: 2023-04-13 Radiation Oncologist: Dorothy Puffer, M.D. [facility]                             RADIATION ONCOLOGY END OF TREATMENT NOTE     Diagnosis: C34.31 Malignant neoplasm of lower lobe, right bronchus or lung Staging on 2023-02-26: Small cell carcinoma of lung, right (HCC) T=cT2, N=cN3, M=cM1b Intent: Palliative     ==========DELIVERED PLANS==========  First Treatment Date: 2023-03-02 - Last Treatment Date: 2023-03-09   Plan Name: Lung_R Site: Lung, Right Technique: 3D Mode: Photon Dose Per Fraction: 3 Gy Prescribed Dose (Delivered / Prescribed): 3 Gy / 3 Gy Prescribed Fxs (Delivered / Prescribed): 1 / 1   Plan Name: Lung_R_Bst Site: Lung, Right Technique: 3D Mode: Photon Dose Per Fraction: 2.5 Gy Prescribed Dose (Delivered / Prescribed): 5 Gy / 32.5 Gy Prescribed Fxs (Delivered / Prescribed): 2 / 13     ==========ON TREATMENT VISIT DATES========== 2023-03-02     ==========UPCOMING VISITS==========       ==========APPENDIX - ON TREATMENT VISIT NOTES==========   See weekly On Treatment Notes in Epic for details.
